# Patient Record
Sex: Female | Born: 1985 | Race: Black or African American | Hispanic: No | Marital: Married | Smoking: Never smoker
Health system: Southern US, Community
[De-identification: ages and names within clinical notes are randomized; demographics above are authoritative.]

## PROBLEM LIST (undated history)

## (undated) ENCOUNTER — Emergency Department: Admission: EM | Payer: Medicaid Other | Source: Home / Self Care

## (undated) ENCOUNTER — Inpatient Hospital Stay: Payer: Self-pay

## (undated) DIAGNOSIS — I10 Essential (primary) hypertension: Secondary | ICD-10-CM

## (undated) DIAGNOSIS — K439 Ventral hernia without obstruction or gangrene: Secondary | ICD-10-CM

## (undated) DIAGNOSIS — K219 Gastro-esophageal reflux disease without esophagitis: Secondary | ICD-10-CM

## (undated) DIAGNOSIS — R519 Headache, unspecified: Secondary | ICD-10-CM

## (undated) DIAGNOSIS — N883 Incompetence of cervix uteri: Secondary | ICD-10-CM

## (undated) DIAGNOSIS — O99212 Obesity complicating pregnancy, second trimester: Secondary | ICD-10-CM

## (undated) DIAGNOSIS — N926 Irregular menstruation, unspecified: Secondary | ICD-10-CM

## (undated) DIAGNOSIS — O98119 Syphilis complicating pregnancy, unspecified trimester: Secondary | ICD-10-CM

## (undated) DIAGNOSIS — Z8719 Personal history of other diseases of the digestive system: Secondary | ICD-10-CM

## (undated) HISTORY — PX: CHOLECYSTECTOMY: SHX55

## (undated) HISTORY — DX: Obesity complicating pregnancy, second trimester: O99.212

## (undated) HISTORY — DX: Ventral hernia without obstruction or gangrene: K43.9

## (undated) HISTORY — DX: Morbid (severe) obesity due to excess calories: E66.01

---

## 2003-06-24 DIAGNOSIS — N883 Incompetence of cervix uteri: Secondary | ICD-10-CM

## 2004-04-10 ENCOUNTER — Observation Stay: Payer: Self-pay

## 2004-04-10 ENCOUNTER — Inpatient Hospital Stay: Payer: Self-pay | Admitting: Obstetrics & Gynecology

## 2004-07-21 ENCOUNTER — Emergency Department: Payer: Self-pay | Admitting: Emergency Medicine

## 2004-11-15 ENCOUNTER — Emergency Department: Payer: Self-pay | Admitting: Emergency Medicine

## 2004-11-15 ENCOUNTER — Other Ambulatory Visit: Payer: Self-pay

## 2004-11-19 ENCOUNTER — Ambulatory Visit (HOSPITAL_COMMUNITY): Admission: RE | Admit: 2004-11-19 | Discharge: 2004-11-19 | Payer: Self-pay | Admitting: Gynecology

## 2004-12-08 ENCOUNTER — Emergency Department: Payer: Self-pay | Admitting: Unknown Physician Specialty

## 2005-01-01 ENCOUNTER — Ambulatory Visit (HOSPITAL_COMMUNITY): Admission: RE | Admit: 2005-01-01 | Discharge: 2005-01-01 | Payer: Self-pay | Admitting: Gynecology

## 2005-02-17 ENCOUNTER — Observation Stay: Payer: Self-pay | Admitting: Obstetrics and Gynecology

## 2005-04-08 ENCOUNTER — Emergency Department: Payer: Self-pay | Admitting: Emergency Medicine

## 2005-04-26 ENCOUNTER — Observation Stay: Payer: Self-pay

## 2005-05-09 ENCOUNTER — Observation Stay: Payer: Self-pay | Admitting: Obstetrics & Gynecology

## 2005-05-15 ENCOUNTER — Inpatient Hospital Stay: Payer: Self-pay | Admitting: Obstetrics & Gynecology

## 2005-05-16 DIAGNOSIS — O3430 Maternal care for cervical incompetence, unspecified trimester: Secondary | ICD-10-CM

## 2006-09-08 ENCOUNTER — Emergency Department: Payer: Self-pay

## 2007-04-06 ENCOUNTER — Emergency Department: Payer: Self-pay | Admitting: Internal Medicine

## 2007-04-18 ENCOUNTER — Emergency Department: Payer: Self-pay

## 2007-05-29 ENCOUNTER — Emergency Department: Payer: Self-pay | Admitting: Internal Medicine

## 2007-06-03 ENCOUNTER — Emergency Department: Payer: Self-pay | Admitting: Emergency Medicine

## 2009-02-21 ENCOUNTER — Ambulatory Visit: Payer: Self-pay

## 2009-04-26 ENCOUNTER — Encounter: Payer: Self-pay | Admitting: Maternal & Fetal Medicine

## 2009-04-30 ENCOUNTER — Encounter: Payer: Self-pay | Admitting: Obstetrics and Gynecology

## 2009-05-21 ENCOUNTER — Encounter: Payer: Self-pay | Admitting: Maternal & Fetal Medicine

## 2009-06-18 ENCOUNTER — Encounter: Payer: Self-pay | Admitting: Obstetrics and Gynecology

## 2009-06-21 ENCOUNTER — Encounter: Payer: Self-pay | Admitting: Maternal & Fetal Medicine

## 2009-06-28 ENCOUNTER — Encounter: Payer: Self-pay | Admitting: Obstetrics and Gynecology

## 2009-08-06 ENCOUNTER — Encounter: Payer: Self-pay | Admitting: Maternal & Fetal Medicine

## 2009-08-30 ENCOUNTER — Encounter: Payer: Self-pay | Admitting: Obstetrics & Gynecology

## 2009-09-11 ENCOUNTER — Encounter: Payer: Self-pay | Admitting: Pediatric Cardiology

## 2009-11-02 ENCOUNTER — Observation Stay: Payer: Self-pay

## 2009-11-06 ENCOUNTER — Observation Stay: Payer: Self-pay | Admitting: Obstetrics and Gynecology

## 2009-12-04 ENCOUNTER — Observation Stay: Payer: Self-pay | Admitting: Obstetrics and Gynecology

## 2009-12-07 ENCOUNTER — Observation Stay: Payer: Self-pay | Admitting: Obstetrics and Gynecology

## 2009-12-11 ENCOUNTER — Observation Stay: Payer: Self-pay | Admitting: Obstetrics and Gynecology

## 2009-12-14 ENCOUNTER — Inpatient Hospital Stay: Payer: Self-pay

## 2009-12-14 DIAGNOSIS — O3430 Maternal care for cervical incompetence, unspecified trimester: Secondary | ICD-10-CM

## 2010-03-14 ENCOUNTER — Emergency Department: Payer: Self-pay | Admitting: Emergency Medicine

## 2010-04-05 ENCOUNTER — Emergency Department: Payer: Self-pay | Admitting: Emergency Medicine

## 2010-04-09 LAB — HM PAP SMEAR: HM Pap smear: NEGATIVE

## 2010-04-14 ENCOUNTER — Observation Stay: Payer: Self-pay | Admitting: Vascular Surgery

## 2010-07-14 ENCOUNTER — Encounter: Payer: Self-pay | Admitting: Gynecology

## 2011-12-15 ENCOUNTER — Ambulatory Visit: Payer: Self-pay | Admitting: Family Medicine

## 2015-05-12 ENCOUNTER — Ambulatory Visit
Admission: EM | Admit: 2015-05-12 | Discharge: 2015-05-12 | Discharge status: Against Medical Advice | Payer: Medicaid Other | Attending: Internal Medicine | Admitting: Internal Medicine

## 2015-05-12 ENCOUNTER — Encounter: Payer: Self-pay | Admitting: Emergency Medicine

## 2015-05-12 HISTORY — DX: Irregular menstruation, unspecified: N92.6

## 2015-05-12 HISTORY — DX: Essential (primary) hypertension: I10

## 2015-05-12 NOTE — ED Notes (Signed)
Pt having heavy bleeding, stands for 12 hours and bleeding , has to frequently go to bathroom to change pads, periods irregular.

## 2015-05-21 NOTE — ED Provider Notes (Signed)
Patient left without being seen  Eustace MooreLaura W Xylan Sheils, MD 05/21/15 [phone]

## 2017-01-28 ENCOUNTER — Emergency Department
Admission: EM | Admit: 2017-01-28 | Discharge: 2017-01-28 | Disposition: A | Discharge status: Home / Self Care | Payer: Self-pay | Attending: Emergency Medicine | Admitting: Emergency Medicine

## 2017-01-28 ENCOUNTER — Emergency Department: Payer: Self-pay

## 2017-01-28 DIAGNOSIS — M62830 Muscle spasm of back: Secondary | ICD-10-CM | POA: Insufficient documentation

## 2017-01-28 DIAGNOSIS — M7552 Bursitis of left shoulder: Secondary | ICD-10-CM | POA: Insufficient documentation

## 2017-01-28 DIAGNOSIS — I10 Essential (primary) hypertension: Secondary | ICD-10-CM | POA: Insufficient documentation

## 2017-01-28 MED ORDER — TRAMADOL HCL 50 MG PO TABS: 50.0000 mg | ORAL_TABLET | Freq: Four times a day (QID) | ORAL | 0 refills | Status: DC | PRN

## 2017-01-28 MED ORDER — NAPROXEN 500 MG PO TABS
500.0000 mg | ORAL_TABLET | Freq: Once | ORAL | Status: AC
  Administered 2017-01-28: 500 mg via ORAL
  Filled 2017-01-28: qty 1

## 2017-01-28 MED ORDER — CYCLOBENZAPRINE HCL 10 MG PO TABS: 10.0000 mg | ORAL_TABLET | Freq: Three times a day (TID) | ORAL | 0 refills | Status: DC | PRN

## 2017-01-28 MED ORDER — NAPROXEN 500 MG PO TABS: 500.0000 mg | ORAL_TABLET | Freq: Two times a day (BID) | ORAL | Status: DC

## 2017-01-28 MED ORDER — CYCLOBENZAPRINE HCL 10 MG PO TABS
10.0000 mg | ORAL_TABLET | Freq: Once | ORAL | Status: AC
  Administered 2017-01-28: 10 mg via ORAL
  Filled 2017-01-28: qty 1

## 2017-01-28 MED ORDER — TRAMADOL HCL 50 MG PO TABS
50.0000 mg | ORAL_TABLET | Freq: Once | ORAL | Status: AC
  Administered 2017-01-28: 50 mg via ORAL
  Filled 2017-01-28: qty 1

## 2017-01-28 NOTE — ED Provider Notes (Signed)
[facility] Emergency Department Provider Note   ____________________________________________   First MD Initiated Contact with Patient 01/28/17 [phone]     (approximate)  I have reviewed the triage vital signs and the nursing notes.   HISTORY  Chief Complaint Shoulder Pain    HPI Donna Navarro is a 31 y.o. female patient complaining of increasing left shoulder pain and swelling for 3 days. Patient denies any provocative incident. 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 He is right-hand dominant.Patient rates the pain as a 7/10. Patient described a pain as "achy". Patient also complaining of mid back muscle spasms. No palliative measures for both complaints.   Past Medical History:  Diagnosis Date  . Hypertension   . Irregular periods     There are no active problems to display for this patient.   Past Surgical History:  Procedure Laterality Date  . CHOLECYSTECTOMY      Prior to Admission medications   Medication Sig Start Date End Date Taking? Authorizing Provider  cyclobenzaprine (FLEXERIL) 10 MG tablet Take 1 tablet (10 mg total) by mouth 3 (three) times daily as needed. 01/28/17   Joni ReiningSmith, Ava Deguire K, PA-C  naproxen (NAPROSYN) 500 MG tablet Take 1 tablet (500 mg total) by mouth 2 (two) times daily with a meal. 01/28/17   Joni ReiningSmith, Renel Ende K, PA-C  traMADol (ULTRAM) 50 MG tablet Take 1 tablet (50 mg total) by mouth every 6 (six) hours as needed for moderate pain. 01/28/17   Joni ReiningSmith, Karem Tomaso K, PA-C    Allergies Patient has no known allergies.  No family history on file.  Social History Social History  Substance Use Topics  . Smoking status: Never Smoker  . Smokeless tobacco: Never Used  . Alcohol use No    Review of Systems  Constitutional: No fever/chills Eyes: No visual changes. ENT: No sore throat. Cardiovascular: Denies chest pain. Respiratory: Denies shortness of breath. Gastrointestinal: No abdominal pain.  No nausea, no  vomiting.  No diarrhea.  No constipation. Genitourinary: Negative for dysuria. Musculoskeletal: Shoulder mid back pain  Skin: Negative for rash. Neurological: Negative for headaches, focal weakness or numbness. Endocrine:Hypertension ____________________________________________   PHYSICAL EXAM:  VITAL SIGNS: ED Triage Vitals  Enc Vitals Group     BP 01/28/17 0827 (!) 158/97     Pulse Rate 01/28/17 0827 88     Resp 01/28/17 0827 18     Temp 01/28/17 0827 98.4 F (36.9 C)     Temp Source 01/28/17 0827 Oral     SpO2 01/28/17 0827 100 %     Weight 01/28/17 0828 300 lb (136.1 kg)     Height 01/28/17 0828 5\' 7"  (1.702 m)     Head Circumference --      Peak Flow --      Pain Score 01/28/17 0818 7     Pain Loc --      Pain Edu? --      Excl. in GC? --     Constitutional: Alert and oriented. Well appearing and in no acute distress.Morbid obesity Cardiovascular: Normal rate, regular rhythm. Grossly normal heart sounds.  Good peripheral circulation. Limited blood pressure Respiratory: Normal respiratory effort.  No retractions. Lungs CTAB. Musculoskeletal: No obvious deformity to the left shoulder. [facility]. Examination of the back is limited by patient body habitus. No obvious deformity. No guarding palpation spinal processes. Patient has bilateral infrascapular muscle spasms with abduction and overhead reaching the arms.  Neurologic:  Normal speech  and language. No gross focal neurologic deficits are appreciated. No gait instability. Skin:  Skin is warm, dry and intact. No rash noted. Psychiatric: Mood and affect are normal. Speech and behavior are normal.  ____________________________________________   LABS (all labs ordered are listed, but only abnormal results are displayed)  Labs Reviewed - No data to display ____________________________________________  EKG   ____________________________________________  RADIOLOGY  Dg Shoulder Left  Result  Date: 01/28/2017 CLINICAL DATA:  Shoulder pain for several days, no known injury, initial encounter EXAM: LEFT SHOULDER - 2+ VIEW COMPARISON:  None. FINDINGS: There is no evidence of fracture or dislocation. There is no evidence of arthropathy or other focal bone abnormality. Soft tissues are unremarkable. IMPRESSION: No acute abnormality noted. Electronically Signed   By: Alcide Clever M.D.   On: 01/28/2017 09:18    No acute findings of the left shoulder __________________________________________   PROCEDURES  Procedure(s) performed: None  Procedures  Critical Care performed: No  ____________________________________________   INITIAL IMPRESSION / ASSESSMENT AND PLAN / ED COURSE  Pertinent labs & imaging results that were available during my care of the patient were reviewed by me and considered in my medical decision making (see chart for details).  Bursitis of the left shoulder muscle spasms. Discuss negative x-ray finding with patient. Patient given discharge care instructions. Patient advised to take medication as directed and follow with PCP.    ____________________________________________   FINAL CLINICAL IMPRESSION(S) / ED DIAGNOSES  Final diagnoses:  Bursitis of left shoulder  Back muscle spasm      NEW MEDICATIONS STARTED DURING THIS VISIT:  New Prescriptions   CYCLOBENZAPRINE (FLEXERIL) 10 MG TABLET    Take 1 tablet (10 mg total) by mouth 3 (three) times daily as needed.   NAPROXEN (NAPROSYN) 500 MG TABLET    Take 1 tablet (500 mg total) by mouth 2 (two) times daily with a meal.   TRAMADOL (ULTRAM) 50 MG TABLET    Take 1 tablet (50 mg total) by mouth every 6 (six) hours as needed for moderate pain.     Note:  This document was prepared using Dragon voice recognition software and may include unintentional dictation errors.    Joni Reining, PA-C 01/28/17 0932    Emily Filbert, MD 01/28/17 [phone]

## 2017-01-28 NOTE — ED Triage Notes (Signed)
Pt c/o left shoulder pain for the past 3 days, denies any injuries.

## 2017-03-14 ENCOUNTER — Encounter: Payer: Self-pay | Admitting: Medical Oncology

## 2017-03-14 ENCOUNTER — Emergency Department
Admission: EM | Admit: 2017-03-14 | Discharge: 2017-03-14 | Disposition: A | Discharge status: Home / Self Care | Payer: Self-pay | Attending: Emergency Medicine | Admitting: Emergency Medicine

## 2017-03-14 DIAGNOSIS — N938 Other specified abnormal uterine and vaginal bleeding: Secondary | ICD-10-CM | POA: Insufficient documentation

## 2017-03-14 DIAGNOSIS — N390 Urinary tract infection, site not specified: Secondary | ICD-10-CM | POA: Insufficient documentation

## 2017-03-14 DIAGNOSIS — I10 Essential (primary) hypertension: Secondary | ICD-10-CM | POA: Insufficient documentation

## 2017-03-14 LAB — URINALYSIS, COMPLETE (UACMP) WITH MICROSCOPIC
Bacteria, UA: NONE SEEN
Bilirubin Urine: NEGATIVE
GLUCOSE, UA: NEGATIVE mg/dL
Ketones, ur: NEGATIVE mg/dL
Nitrite: NEGATIVE
PH: 6 (ref 5.0–8.0)
Protein, ur: NEGATIVE mg/dL
SPECIFIC GRAVITY, URINE: 1.018 (ref 1.005–1.030)

## 2017-03-14 LAB — POCT PREGNANCY, URINE: Preg Test, Ur: NEGATIVE

## 2017-03-14 MED ORDER — PHENAZOPYRIDINE HCL 100 MG PO TABS: 100.0000 mg | ORAL_TABLET | Freq: Three times a day (TID) | ORAL | 0 refills | Status: DC | PRN

## 2017-03-14 MED ORDER — SULFAMETHOXAZOLE-TRIMETHOPRIM 800-160 MG PO TABS: 1.0000 | ORAL_TABLET | Freq: Two times a day (BID) | ORAL | 0 refills | Status: DC

## 2017-03-14 NOTE — ED Triage Notes (Signed)
Pt reports she began her period on 9/16 and passed some clots, pt denies having any vaginal bleeding at this time, states that she has been having lower abd cramping but denies this at this time also. Pt in NAD.

## 2017-03-14 NOTE — ED Provider Notes (Signed)
[facility] Emergency Department Provider Note  ___________________________________________   First MD Initiated Contact with Patient 03/14/17 [phone]     (approximate)  I have reviewed the triage vital signs and the nursing notes.   HISTORY  Chief Complaint Vaginal Bleeding and Abdominal Cramping   HPI Donna Navarro is a 31 y.o. female is here with complaint of lower abdominal pain. Patient states she's also had some dysuria and frequency. She states that she originally thought that this was menstrual cramps that she was experiencing. She denies any fever or chills. She denies any back pain, nausea or vomiting. She denies any pain at this time.  Past Medical History:  Diagnosis Date  . Hypertension   . Irregular periods     There are no active problems to display for this patient.   Past Surgical History:  Procedure Laterality Date  . CHOLECYSTECTOMY      Prior to Admission medications   Medication Sig Start Date End Date Taking? Authorizing Provider  phenazopyridine (PYRIDIUM) 100 MG tablet Take 1 tablet (100 mg total) by mouth 3 (three) times daily as needed for pain. 03/14/17 03/14/18  Tommi Rumps, PA-C  sulfamethoxazole-trimethoprim (BACTRIM DS,SEPTRA DS) 800-160 MG tablet Take 1 tablet by mouth 2 (two) times daily. 03/14/17   Tommi Rumps, PA-C    Allergies Patient has no known allergies.  No family history on file.  Social History Social History  Substance Use Topics  . Smoking status: Never Smoker  . Smokeless tobacco: Never Used  . Alcohol use No    Review of Systems Constitutional: No fever/chills Cardiovascular: Denies chest pain. Respiratory: Denies shortness of breath. Gastrointestinal: Resolved abdominal pain.  No nausea, no vomiting. Genitourinary: Positive for dysuria. Positive for frequency. Musculoskeletal: Negative for back pain. Skin: Negative for rash. Neurological: Negative for headaches, focal  weakness or numbness. ___________________________________________   PHYSICAL EXAM:  VITAL SIGNS: ED Triage Vitals  Enc Vitals Group     BP 03/14/17 0724 (!) 174/98     Pulse Rate 03/14/17 0724 89     Resp 03/14/17 0724 16     Temp 03/14/17 0724 98.1 F (36.7 C)     Temp Source 03/14/17 0724 Oral     SpO2 03/14/17 0724 100 %     Weight 03/14/17 0725 (!) 330 lb (149.7 kg)     Height 03/14/17 0725  (1.702 m)     Head Circumference --      Peak Flow --      Pain Score 03/14/17 0724 0     Pain Loc --      Pain Edu? --      Excl. in GC? --    Constitutional: Alert and oriented. Well appearing and in no acute distress. Eyes: Conjunctivae are normal.  Head: Atraumatic. Nose: No congestion/rhinnorhea. Neck: No stridor.   Cardiovascular: Normal rate, regular rhythm. Grossly normal heart sounds.  Good peripheral circulation. Respiratory: Normal respiratory effort.  No retractions. Lungs CTAB. Gastrointestinal: Soft and nontender. No distention.  Musculoskeletal: Moves upper and lower extremities without difficulty. Normal gait was noted. Neurologic:  Normal speech and language. No gross focal neurologic deficits are appreciated.  Skin:  Skin is warm, dry and intact.  Psychiatric: Mood and affect are normal. Speech and behavior are normal.  ____________________________________________   LABS (all labs ordered are listed, but only abnormal results are displayed)  Labs Reviewed  URINALYSIS, COMPLETE (UACMP) WITH MICROSCOPIC - Abnormal; Notable for the following:  Result Value   Color, Urine YELLOW (*)    APPearance HAZY (*)    Hgb urine dipstick SMALL (*)    Leukocytes, UA MODERATE (*)    Squamous Epithelial / LPF 6-30 (*)    All other components within normal limits  POC URINE PREG, ED  POCT PREGNANCY, URINE    PROCEDURES  Procedure(s) performed: None  Procedures  Critical Care performed: No  ____________________________________________   INITIAL  IMPRESSION / ASSESSMENT AND PLAN / ED COURSE  Pertinent labs & imaging results that were available during my care of the patient were reviewed by me and considered in my medical decision making (see chart for details).  Patient was made aware that she has a urinary tract infection. She states that this explains her urinary frequency that she did not mention prior to her history. Patient was placed on Bactrim twice a day for 10 days. She is also given a prescription for Pyridium 1 tablet 3 times a day for dysuria and frequency. She is encouraged to increase fluids and take Tylenol if needed. She'll [facility]-up [facility] in 14 days for recheck of her urine and also her blood pressure.   ___________________________________________   FINAL CLINICAL IMPRESSION(S) / ED DIAGNOSES  Final diagnoses:  Acute urinary tract infection      NEW MEDICATIONS STARTED DURING THIS VISIT:  Discharge Medication List as of 03/14/2017  9:31 AM    START taking these medications   Details  phenazopyridine (PYRIDIUM) 100 MG tablet Take 1 tablet (100 mg total) by mouth 3 (three) times daily as needed for pain., Starting Sat 03/14/2017, Until Sun 03/14/2018, Print    sulfamethoxazole-trimethoprim (BACTRIM DS,SEPTRA DS) 800-160 MG tablet Take 1 tablet by mouth 2 (two) times daily., Starting Sat 03/14/2017, Print         Note:  This document was prepared using Dragon voice recognition software and may include unintentional dictation errors.    Tommi Rumps, PA-C 03/14/17 1220    Nita Sickle, MD 03/18/17 [phone]

## 2017-03-14 NOTE — ED Notes (Signed)
NAD noted at time of D/C. Pt denies questions or concerns. Pt ambulatory to the lobby at this time. Pt instructed to take her BP medication when she gets home. PA made aware of pt's BP.

## 2017-03-14 NOTE — Discharge Instructions (Signed)
Begin taking antibiotics today. Pyridium one tablet 3 times a day. Be aware that this medication will turn your urine a fluorescent orange color. Increase fluids. He may also take Tylenol if needed for pain. Follow-up with your primary care doctor for recheck of your urine in approximately 14 days.

## 2017-03-14 NOTE — ED Notes (Signed)
Pt states started period on 9/16, states hx of irregular periods. Pt states started passing several clots. Pt denies any bleeding at this time. Pt also c/o menstrual cramps during her period, states cramping has not resolved, pt states cramping is intermittent. Pt denies any vomiting or diarrhea. Pt states she takes Tylenol and Alleve OTC for pain with relief at home.

## 2017-04-28 ENCOUNTER — Emergency Department: Payer: Self-pay

## 2017-04-28 ENCOUNTER — Emergency Department
Admission: EM | Admit: 2017-04-28 | Discharge: 2017-04-28 | Disposition: A | Discharge status: Home / Self Care | Payer: Self-pay | Attending: Emergency Medicine | Admitting: Emergency Medicine

## 2017-04-28 ENCOUNTER — Encounter: Payer: Self-pay | Admitting: Emergency Medicine

## 2017-04-28 DIAGNOSIS — K432 Incisional hernia without obstruction or gangrene: Secondary | ICD-10-CM | POA: Insufficient documentation

## 2017-04-28 DIAGNOSIS — I10 Essential (primary) hypertension: Secondary | ICD-10-CM | POA: Insufficient documentation

## 2017-04-28 DIAGNOSIS — M7552 Bursitis of left shoulder: Secondary | ICD-10-CM | POA: Insufficient documentation

## 2017-04-28 LAB — CBC WITH DIFFERENTIAL/PLATELET
BASOS ABS: 0.1 10*3/uL (ref 0–0.1)
Basophils Relative: 0 %
EOS ABS: 0.1 10*3/uL (ref 0–0.7)
EOS PCT: 1 %
HCT: 37.6 % (ref 35.0–47.0)
Hemoglobin: 12.1 g/dL (ref 12.0–16.0)
Lymphocytes Relative: 20 %
Lymphs Abs: 2.8 10*3/uL (ref 1.0–3.6)
MCH: 28 pg (ref 26.0–34.0)
MCHC: 32.1 g/dL (ref 32.0–36.0)
MCV: 87 fL (ref 80.0–100.0)
Monocytes Absolute: 0.8 10*3/uL (ref 0.2–0.9)
Monocytes Relative: 5 %
NEUTROS PCT: 74 %
Neutro Abs: 10.5 10*3/uL — ABNORMAL HIGH (ref 1.4–6.5)
PLATELETS: 351 10*3/uL (ref 150–440)
RBC: 4.33 MIL/uL (ref 3.80–5.20)
RDW: 14.5 % (ref 11.5–14.5)
WBC: 14.2 10*3/uL — AB (ref 3.6–11.0)

## 2017-04-28 LAB — COMPREHENSIVE METABOLIC PANEL
ALT: 17 U/L (ref 14–54)
AST: 20 U/L (ref 15–41)
Albumin: 3.7 g/dL (ref 3.5–5.0)
Alkaline Phosphatase: 84 U/L (ref 38–126)
Anion gap: 8 (ref 5–15)
BILIRUBIN TOTAL: 0.6 mg/dL (ref 0.3–1.2)
BUN: 12 mg/dL (ref 6–20)
CO2: 26 mmol/L (ref 22–32)
CREATININE: 0.83 mg/dL (ref 0.44–1.00)
Calcium: 8.8 mg/dL — ABNORMAL LOW (ref 8.9–10.3)
Chloride: 102 mmol/L (ref 101–111)
Glucose, Bld: 133 mg/dL — ABNORMAL HIGH (ref 65–99)
POTASSIUM: 3.5 mmol/L (ref 3.5–5.1)
Sodium: 136 mmol/L (ref 135–145)
TOTAL PROTEIN: 7.5 g/dL (ref 6.5–8.1)

## 2017-04-28 LAB — URINALYSIS, COMPLETE (UACMP) WITH MICROSCOPIC
BILIRUBIN URINE: NEGATIVE
Bacteria, UA: NONE SEEN
GLUCOSE, UA: NEGATIVE mg/dL
KETONES UR: NEGATIVE mg/dL
NITRITE: NEGATIVE
PH: 6 (ref 5.0–8.0)
PROTEIN: NEGATIVE mg/dL
Specific Gravity, Urine: 1.019 (ref 1.005–1.030)

## 2017-04-28 LAB — POCT PREGNANCY, URINE: PREG TEST UR: NEGATIVE

## 2017-04-28 MED ORDER — IOPAMIDOL (ISOVUE-300) INJECTION 61%
100.0000 mL | Freq: Once | INTRAVENOUS | Status: AC | PRN
  Administered 2017-04-28: 100 mL via INTRAVENOUS
  Filled 2017-04-28: qty 100

## 2017-04-28 MED ORDER — NAPROXEN 500 MG PO TABS: 500.0000 mg | ORAL_TABLET | Freq: Two times a day (BID) | ORAL | 0 refills | Status: DC

## 2017-04-28 MED ORDER — KETOROLAC TROMETHAMINE 30 MG/ML IJ SOLN
30.0000 mg | Freq: Once | INTRAMUSCULAR | Status: AC
  Administered 2017-04-28: 30 mg via INTRAVENOUS

## 2017-04-28 MED ORDER — KETOROLAC TROMETHAMINE 30 MG/ML IJ SOLN
INTRAMUSCULAR | Status: AC
  Administered 2017-04-28: 30 mg via INTRAVENOUS
  Filled 2017-04-28: qty 1

## 2017-04-28 MED ORDER — IOPAMIDOL (ISOVUE-300) INJECTION 61%
30.0000 mL | Freq: Once | INTRAVENOUS | Status: AC | PRN
  Administered 2017-04-28: 30 mL via ORAL
  Filled 2017-04-28: qty 30

## 2017-04-28 NOTE — ED Notes (Signed)
First Nurse Note:  Patient complaining of left shoulder pain.  Felt numb yesterday and pain started today.

## 2017-04-28 NOTE — ED Notes (Signed)
Pt states left shoulder pain and swelling, states she believes that it is "fluid" on her shoulder, pt also states large hernia in her abd, states pain is worsening in hernia over the past few days and the herina is growing, awake and alert in no acute distress

## 2017-04-28 NOTE — ED Notes (Signed)
Patient transported to CT 

## 2017-04-28 NOTE — ED Triage Notes (Signed)
Pt reports has had issues with fluid on her left shoulder and it hurts intermittently and now she thinks that the fluid is building back up. Pt reports hurting for a couple of days worse this morning. Pt reports it gets worse in rainy weather. Pt reports last time she had to get some medication to help with the fluid. Pt reports also has a hernia that is sore. Reports has been sore for 4 years. Pt reports was supposed to be set up with a surgeon but her PCP has not done that yet.

## 2017-04-28 NOTE — ED Provider Notes (Signed)
[facility] Emergency Department Provider Note   ____________________________________________   First MD Initiated Contact with Patient 04/28/17 1001     (approximate)  I have reviewed the triage vital signs and the nursing notes.   HISTORY  Chief Complaint Shoulder Injury and Hernia    HPI Unknown FoleyJessica N Keeble is a 31 y.o. female patient presents with a large abdominal hernia with increased in size and pain in 2 days. Patient stated decreased appetite secondary to abdominal pain. Patient denies nausea, vomiting, or diarrhea.  Patient also complaining of left shoulder pain which she believes is secondary to a flare up of her bursitis. Patient rates her shoulder and abdominal pain as a 5 over 5. No palliative measures for complaint.  Past Medical History:  Diagnosis Date  . Hypertension   . Irregular periods     There are no active problems to display for this patient.   Past Surgical History:  Procedure Laterality Date  . CHOLECYSTECTOMY      Prior to Admission medications   Medication Sig Start Date End Date Taking? Authorizing Provider  naproxen (NAPROSYN) 500 MG tablet Take 1 tablet (500 mg total) 2 (two) times daily with a meal by mouth. 04/28/17   Joni ReiningSmith, Ronald K, PA-C  phenazopyridine (PYRIDIUM) 100 MG tablet Take 1 tablet (100 mg total) by mouth 3 (three) times daily as needed for pain. 03/14/17 03/14/18  Tommi RumpsSummers, Rhonda L, PA-C  sulfamethoxazole-trimethoprim (BACTRIM DS,SEPTRA DS) 800-160 MG tablet Take 1 tablet by mouth 2 (two) times daily. 03/14/17   Tommi RumpsSummers, Rhonda L, PA-C    Allergies Patient has no known allergies.  No family history on file.  Social History Social History   Tobacco Use  . Smoking status: Never Smoker  . Smokeless tobacco: Never Used  Substance Use Topics  . Alcohol use: No  . Drug use: Not on file    Review of Systems Constitutional: No fever/chills Eyes: No visual changes. ENT: No sore  throat. Cardiovascular: Denies chest pain. Respiratory: Denies shortness of breath. Gastrointestinal: Abdominal pain.  No nausea, no vomiting.  No diarrhea.  No constipation. Abdominal hernia. Genitourinary: Negative for dysuria. Musculoskeletal: Left shoulder pain Skin: Negative for rash. Neurological: Negative for headaches, focal weakness or numbness. Endocrine:Hypertension ____________________________________________   PHYSICAL EXAM:  VITAL SIGNS: ED Triage Vitals [04/28/17 0831]  Enc Vitals Group     BP (!) 153/104     Pulse Rate 92     Resp 20     Temp 97.7 F (36.5 C)     Temp Source Oral     SpO2 100 %     Weight (!) 323 lb (146.5 kg)     Height 5\' 7"  (1.702 m)     Head Circumference      Peak Flow      Pain Score      Pain Loc      Pain Edu?      Excl. in GC?     Constitutional: Alert and oriented. Well appearing and in no acute distress. Morbid obesity Eyes: Conjunctivae are normal. PERRL. EOMI. Head: Atraumatic. Nose: No congestion/rhinnorhea. Mouth/Throat: Mucous membranes are moist.  Oropharynx non-erythematous. Neck: No stridor.  No cervical spine tenderness to palpation. Hematological/Lymphatic/Immunilogical: No cervical lymphadenopathy. Cardiovascular: Normal rate, regular rhythm. Grossly normal heart sounds.  Good peripheral circulation. Elevated blood pressure Respiratory: Normal respiratory effort.  No retractions. Lungs CTAB. Gastrointestinal: Large umbilical hernia. Soft and nontender. No distention. No abdominal bruits. No CVA tenderness. Musculoskeletal: No obvious  deformity to left shoulder. Patient is moderate guarding palpation GH joint. Patient decreased range of motion with abduction overhead reaching.  Neurologic:  Normal speech and language. No gross focal neurologic deficits are appreciated. No gait instability. Skin:  Skin is warm, dry and intact. No rash noted. Psychiatric: Mood and affect are normal. Speech and behavior are  normal.  ____________________________________________   LABS (all labs ordered are listed, but only abnormal results are displayed)  Labs Reviewed  COMPREHENSIVE METABOLIC PANEL - Abnormal; Notable for the following components:      Result Value   Glucose, Bld 133 (*)    Calcium 8.8 (*)    All other components within normal limits  CBC WITH DIFFERENTIAL/PLATELET - Abnormal; Notable for the following components:   WBC 14.2 (*)    Neutro Abs 10.5 (*)    All other components within normal limits  URINALYSIS, COMPLETE (UACMP) WITH MICROSCOPIC - Abnormal; Notable for the following components:   Color, Urine YELLOW (*)    APPearance CLOUDY (*)    Hgb urine dipstick SMALL (*)    Leukocytes, UA MODERATE (*)    Squamous Epithelial / LPF 6-30 (*)    All other components within normal limits  POC URINE PREG, ED  POCT PREGNANCY, URINE   ____________________________________________  EKG   ____________________________________________  RADIOLOGY  Ct Abdomen Pelvis W Contrast  Result Date: 04/28/2017 CLINICAL DATA:  Abdominal hernia. EXAM: CT ABDOMEN AND PELVIS WITH CONTRAST TECHNIQUE: Multidetector CT imaging of the abdomen and pelvis was performed using the standard protocol following bolus administration of intravenous contrast. CONTRAST:  ISOVUE-300 IOPAMIDOL (ISOVUE-300) INJECTION 61% COMPARISON:  None available currently. FINDINGS: Lower chest: No acute abnormality. Hepatobiliary: No focal liver abnormality is seen. Status post cholecystectomy. No biliary dilatation. Pancreas: Unremarkable. No pancreatic ductal dilatation or surrounding inflammatory changes. Spleen: Normal in size without focal abnormality. Adrenals/Urinary Tract: Adrenal glands are unremarkable. Kidneys are normal, without renal calculi, focal lesion, or hydronephrosis. Bladder is unremarkable. Stomach/Bowel: The stomach appears normal. There is no evidence of bowel obstruction or inflammation. The appendix is not  visualized. Vascular/Lymphatic: No significant vascular findings are present. No enlarged abdominal or pelvic lymph nodes. Reproductive: Uterus and bilateral adnexa are unremarkable. Other: No abnormal fluid collection is noted. Large supraumbilical ventral hernia is noted which contains a loop of transverse colon, but does not result in incarceration or obstruction. Musculoskeletal: No acute or significant osseous findings. IMPRESSION: Large supraumbilical ventral hernia is noted which contains a loop of transverse colon, but does not result in incarceration or obstruction. No other abnormality seen in the abdomen or pelvis. Electronically Signed   By: Lupita Raider, M.D.   On: 04/28/2017 12:22    ____________________________________________   PROCEDURES  Procedure(s) performed: None  Procedures  Critical Care performed: No  ____________________________________________   INITIAL IMPRESSION / ASSESSMENT AND PLAN / ED COURSE  As part of my medical decision making, I reviewed the following data within the electronic MEDICAL RECORD NUMBER    Patient presents with left shoulder pain and protruding ventral Hernia. CT revealed that the hernia was not incarcerated or obstructed. I was able to manually reduce the hernia and instructed the patient on self reduction. Patient advised follow-up with the surgical department for definitive evaluation and treatment. Patient advised to take naproxen for bursitis of the left shoulder. Patient given a work note.      ____________________________________________   FINAL CLINICAL IMPRESSION(S) / ED DIAGNOSES  Final diagnoses:  Ventral hernia, recurrent  Bursitis of  left shoulder      Note:  This document was prepared using Dragon voice recognition software and may include unintentional dictation errors.    Joni ReiningSmith, Ronald K, PA-C 04/28/17 1316    Dionne BucySiadecki, Sebastian, MD 04/28/17 1527

## 2017-04-28 NOTE — Discharge Instructions (Signed)
Take medication as directed for shoulder pain. Continue to reduce hernia until evaluation by surgical clinic.

## 2017-04-30 ENCOUNTER — Ambulatory Visit (INDEPENDENT_AMBULATORY_CARE_PROVIDER_SITE_OTHER): Payer: Self-pay | Admitting: General Surgery

## 2017-04-30 ENCOUNTER — Encounter: Payer: Self-pay | Admitting: General Surgery

## 2017-04-30 DIAGNOSIS — K439 Ventral hernia without obstruction or gangrene: Secondary | ICD-10-CM | POA: Insufficient documentation

## 2017-04-30 HISTORY — DX: Morbid (severe) obesity due to excess calories: E66.01

## 2017-04-30 HISTORY — DX: Ventral hernia without obstruction or gangrene: K43.9

## 2017-04-30 NOTE — Patient Instructions (Signed)
We will call you as soon as we have an answer.

## 2017-04-30 NOTE — Progress Notes (Signed)
Patient ID: Donna Navarro, female   DOB: 1986/05/13, 31 y.o.   MRN: [id number]  CC: Ventral hernia  HPI Donna Navarro is a 31 y.o. female who presents to clinic today for follow-up of the ventral hernia.  She was seen in the ER for this 2 days ago.  She states she has intermittent periods of intense pain to her abdomen at the site of her hernia.  Is been there for at least 6 years.  There are times it goes away completely and other times it protrudes well beyond its normal limits.  She states today with currently a good day.  She denies any fevers, chills, chest pain, shortness of breath.  She does have abdominal pain, intermittent nausea, intermittent diarrhea.  She denies vomiting or constipation.  She is otherwise in her usual state of health but states that her blood pressure is elevated and her weight is going up.  HPI  Past Medical History:  Diagnosis Date  . Hypertension   . Irregular periods     Past Surgical History:  Procedure Laterality Date  . CHOLECYSTECTOMY      Family History  Problem Relation Age of Onset  . Heart disease Mother        Massive Heart Attack    Social History Social History   Tobacco Use  . Smoking status: Never Smoker  . Smokeless tobacco: Never Used  Substance Use Topics  . Alcohol use: No  . Drug use: Not on file    No Known Allergies  Current Outpatient Medications  Medication Sig Dispense Refill  . ibuprofen (ADVIL,MOTRIN) 200 MG tablet Take 200 mg every 6 (six) hours as needed by mouth.     No current facility-administered medications for this visit.      Review of Systems A multi-point review of systems was asked and was negative except for the findings documented in the HPI  Physical Exam Blood pressure (!) 156/95, pulse (!) 103, temperature 97.9 F (36.6 C), temperature source Oral, height 5\' 7"  (1.702 m), weight (!) 160.1 kg (353 lb), last menstrual period 04/11/2017. CONSTITUTIONAL: No acute distress. EYES: Pupils  are equal, round, and reactive to light, Sclera are non-icteric. EARS, NOSE, MOUTH AND THROAT: The oropharynx is clear. The oral mucosa is pink and moist. Hearing is intact to voice. LYMPH NODES:  Lymph nodes in the neck are normal. RESPIRATORY:  Lungs are clear. There is normal respiratory effort, with equal breath sounds bilaterally, and without pathologic use of accessory muscles. CARDIOVASCULAR: Heart is regular without murmurs, gallops, or rubs. GI: The abdomen is very large, soft, minimally tender to deep palpation around her obvious hernia, and nondistended. There is an obvious ventral hernia in the middle of her abdomen that is soft and easily reducible but spontaneously recurs after palpation is released. There is no hepatosplenomegaly. There are normal bowel sounds in all quadrants. GU: Rectal deferred.   MUSCULOSKELETAL: Normal muscle strength and tone. No cyanosis or edema.   SKIN: Turgor is good and there are no pathologic skin lesions or ulcers. NEUROLOGIC: Motor and sensation is grossly normal. Cranial nerves are grossly intact. PSYCH:  Oriented to person, place and time. Affect is normal.  Data Reviewed Images and labs reviewed from the ER 2 days ago which showed a minimally increased white blood cell count of 14.2 but normal H&H and platelet count.  Normal electrolytes with the exception of a glucose of 133 and a calcium of 8.8.  Urinalysis was normal.  CT scan  of the abdomen showed a large ventral hernia measuring approximately 5 cm in greatest diameter.  It is bowel and omentum containing.  There were no signs of obstruction. I have personally reviewed the patient's imaging, laboratory findings and medical records.    Assessment    Ventral hernia in the setting of morbid obesity    Plan    31 year old morbidly obese female with a large ventral hernia.  Discussed the signs and symptoms of incarceration and string elation in detail and to report to the nearest emergency room  should they occur.  Otherwise discussed whether or not the patient be interested in bariatric surgery.  Discussed that given her current obesity that the risk of recurrence would be very high causing her to require multiple operations for this.  Patient voiced understanding.  She is interested in bariatric surgery but is unsure whether or not she can afford it.  Financial assistance paperwork provided today.  We will set her up to be evaluated by bariatric surgeon at a seminar.  She will follow-up in clinic on an as-needed basis or for hernia repair should she not be able to afford bariatric surgery.     Time spent with the patient was 30 minutes, with more than 50% of the time spent in face-to-face education, counseling and care coordination.     Ricarda Frameharles Emonnie Cannady, MD FACS General Surgeon 04/30/2017, 3:06 PM

## 2017-05-01 ENCOUNTER — Telehealth: Payer: Self-pay

## 2017-05-01 NOTE — Telephone Encounter (Signed)
I tried to look for a Bariatric Surgeon that had financial assistance and I did not find one. I told her that Dr. Tonita CongWoodham wanted her to try to loose weight and see her back in 3 months and that then he would talk to her about scheduling her surgery date. Patient understood and had no further questions. I will send her appointment information by mail.

## 2017-05-01 NOTE — Telephone Encounter (Signed)
Called patient to let her know that I looked for Bariatric Surgeons and

## 2017-05-26 ENCOUNTER — Emergency Department
Admission: EM | Admit: 2017-05-26 | Discharge: 2017-05-26 | Disposition: A | Discharge status: Home / Self Care | Payer: Self-pay | Attending: Student in an Organized Health Care Education/Training Program | Admitting: Student in an Organized Health Care Education/Training Program

## 2017-05-26 ENCOUNTER — Encounter: Payer: Self-pay | Admitting: Emergency Medicine

## 2017-05-26 DIAGNOSIS — M542 Cervicalgia: Secondary | ICD-10-CM | POA: Insufficient documentation

## 2017-05-26 DIAGNOSIS — R519 Headache, unspecified: Secondary | ICD-10-CM

## 2017-05-26 DIAGNOSIS — R51 Headache: Secondary | ICD-10-CM | POA: Insufficient documentation

## 2017-05-26 DIAGNOSIS — Z79899 Other long term (current) drug therapy: Secondary | ICD-10-CM | POA: Insufficient documentation

## 2017-05-26 DIAGNOSIS — I10 Essential (primary) hypertension: Secondary | ICD-10-CM | POA: Insufficient documentation

## 2017-05-26 LAB — URINALYSIS, COMPLETE (UACMP) WITH MICROSCOPIC
BILIRUBIN URINE: NEGATIVE
Glucose, UA: NEGATIVE mg/dL
HGB URINE DIPSTICK: NEGATIVE
KETONES UR: NEGATIVE mg/dL
NITRITE: NEGATIVE
PH: 5 (ref 5.0–8.0)
Protein, ur: NEGATIVE mg/dL
SPECIFIC GRAVITY, URINE: 1.025 (ref 1.005–1.030)

## 2017-05-26 LAB — BASIC METABOLIC PANEL
Anion gap: 8 (ref 5–15)
BUN: 12 mg/dL (ref 6–20)
CHLORIDE: 104 mmol/L (ref 101–111)
CO2: 26 mmol/L (ref 22–32)
Calcium: 9.1 mg/dL (ref 8.9–10.3)
Creatinine, Ser: 0.82 mg/dL (ref 0.44–1.00)
Glucose, Bld: 98 mg/dL (ref 65–99)
POTASSIUM: 3.7 mmol/L (ref 3.5–5.1)
SODIUM: 138 mmol/L (ref 135–145)

## 2017-05-26 LAB — PREGNANCY, URINE: Preg Test, Ur: NEGATIVE

## 2017-05-26 LAB — CBC
HEMATOCRIT: 37.4 % (ref 35.0–47.0)
Hemoglobin: 12.4 g/dL (ref 12.0–16.0)
MCH: 28.2 pg (ref 26.0–34.0)
MCHC: 33.1 g/dL (ref 32.0–36.0)
MCV: 85.2 fL (ref 80.0–100.0)
PLATELETS: 293 10*3/uL (ref 150–440)
RBC: 4.39 MIL/uL (ref 3.80–5.20)
RDW: 14.8 % — ABNORMAL HIGH (ref 11.5–14.5)
WBC: 10 10*3/uL (ref 3.6–11.0)

## 2017-05-26 MED ORDER — BUTALBITAL-APAP-CAFFEINE 50-325-40 MG PO TABS: 1.0000 | ORAL_TABLET | Freq: Four times a day (QID) | ORAL | 0 refills | Status: AC | PRN

## 2017-05-26 MED ORDER — NAPROXEN 500 MG PO TABS: 500.0000 mg | ORAL_TABLET | Freq: Two times a day (BID) | ORAL | 0 refills | Status: DC

## 2017-05-26 MED ORDER — PROCHLORPERAZINE EDISYLATE 5 MG/ML IJ SOLN: 10.0000 mg | Freq: Once | INTRAMUSCULAR | Status: DC

## 2017-05-26 MED ORDER — DIPHENHYDRAMINE HCL 50 MG/ML IJ SOLN: 12.5000 mg | Freq: Once | INTRAMUSCULAR | Status: DC

## 2017-05-26 MED ORDER — PROCHLORPERAZINE MALEATE 10 MG PO TABS
10.0000 mg | ORAL_TABLET | Freq: Once | ORAL | Status: AC
  Administered 2017-05-26: 10 mg via ORAL
  Filled 2017-05-26: qty 1

## 2017-05-26 MED ORDER — CYCLOBENZAPRINE HCL 10 MG PO TABS: 10.0000 mg | ORAL_TABLET | Freq: Three times a day (TID) | ORAL | 0 refills | Status: DC | PRN

## 2017-05-26 MED ORDER — BUTALBITAL-APAP-CAFFEINE 50-325-40 MG PO TABS
1.0000 | ORAL_TABLET | ORAL | Status: DC | PRN
  Administered 2017-05-26: 1 via ORAL
  Filled 2017-05-26: qty 1

## 2017-05-26 NOTE — Discharge Instructions (Signed)

## 2017-05-26 NOTE — ED Triage Notes (Signed)
Patient presents to the ED with a headache that started on Thursday.  Patient reports she has also had intermittent dizziness that was worse with the light and worse when she wakes up in the morning.  Patient denies dizziness at this time.  Patient states headache feels worse/different when she stands or leans forward.

## 2017-05-26 NOTE — ED Notes (Signed)
Pharmacy notified to send compazine dose.

## 2017-05-26 NOTE — ED Provider Notes (Signed)
[facility] Emergency Department Provider Note    First MD Initiated Contact with Patient 05/26/17 1419     (approximate)  I have reviewed the triage vital signs and the nursing notes.   HISTORY  Chief Complaint Headache and Dizziness    HPI Donna Navarro is a 31 y.o. female with a history of hypertension but no recent hospitalizations  Presents with 1 week of intermittent headache primarily in the back of her neck on the right side associated with some photophobia lightheadedness and intermittent dizziness particularly when she stands.  Denies any fevers.  No numbness or tingling.  This is not the worst headache of her life.  There is no sudden onset headache.  Patient states that the headache was mild to moderate at its most severe.  She is tried Tylenol without improvement.  Denies any chance of being pregnant.  Denies any dysuria.  Has had some nausea but otherwise good appetite.  Denies any trauma.  No blurry vision.  Past Medical History:  Diagnosis Date  . Hypertension   . Irregular periods    Family History  Problem Relation Age of Onset  . Heart disease Mother        Massive Heart Attack   Past Surgical History:  Procedure Laterality Date  . CHOLECYSTECTOMY     Patient Active Problem List   Diagnosis Date Noted  . Ventral hernia without obstruction or gangrene 04/30/2017  . Morbid obesity (HCC) 04/30/2017      Prior to Admission medications   Medication Sig Start Date End Date Taking? Authorizing Provider  butalbital-acetaminophen-caffeine (FIORICET, ESGIC) 50-325-40 MG tablet Take 1 tablet by mouth every 6 (six) hours as needed for headache. 05/26/17 05/26/18  Willy Eddy, MD  cyclobenzaprine (FLEXERIL) 10 MG tablet Take 1 tablet (10 mg total) by mouth 3 (three) times daily as needed for muscle spasms. 05/26/17   Willy Eddy, MD  ibuprofen (ADVIL,MOTRIN) 200 MG tablet Take 200 mg every 6 (six) hours as needed by mouth.     [provider]  naproxen (NAPROSYN) 500 MG tablet Take 1 tablet (500 mg total) by mouth 2 (two) times daily with a meal. 05/26/17 05/26/18  Willy Eddy, MD    Allergies Patient has no known allergies.    Social History Social History   Tobacco Use  . Smoking status: Never Smoker  . Smokeless tobacco: Never Used  Substance Use Topics  . Alcohol use: No  . Drug use: Not on file    Review of Systems Patient denies headaches, rhinorrhea, blurry vision, numbness, shortness of breath, chest pain, edema, cough, abdominal pain, nausea, vomiting, diarrhea, dysuria, fevers, rashes or hallucinations unless otherwise stated above in HPI. ____________________________________________   PHYSICAL EXAM:  VITAL SIGNS: Vitals:   05/26/17 1326  BP: (!) 158/101  Pulse: 87  Resp: 20  Temp: 98.7 F (37.1 C)  SpO2: 100%    Constitutional: Alert and oriented. Well appearing and in no acute distress. Eyes: Conjunctivae are normal.  Head: Atraumatic. Nose: No congestion/rhinnorhea. Mouth/Throat: Mucous membranes are moist.   Neck: Painless ROM.  Cardiovascular:   Good peripheral circulation. Respiratory: Normal respiratory effort.  No retractions.  Gastrointestinal: Soft and nontender.  Musculoskeletal: No lower extremity tenderness .  No joint effusions. Neurologic:CN- intact.  No facial droop, Normal FNF.  Normal heel to shin.  Sensation intact bilaterally. Normal speech and language. No gross focal neurologic deficits are appreciated. No gait instability. Skin:  Skin is warm, dry and intact. No  rash noted. Psychiatric: Mood and affect are normal. Speech and behavior are normal.  ____________________________________________   LABS (all labs ordered are listed, but only abnormal results are displayed)  Results for orders placed or performed during the hospital encounter of 05/26/17 (from the past 24 hour(s))  Basic metabolic panel     Status: None   Collection Time:  05/26/17  1:32 PM  Result Value Ref Range   Sodium 138 135 - 145 mmol/L   Potassium 3.7 3.5 - 5.1 mmol/L   Chloride 104 101 - 111 mmol/L   CO2 26 22 - 32 mmol/L   Glucose, Bld 98 65 - 99 mg/dL   BUN 12 6 - 20 mg/dL   Creatinine, Ser 1.610.82 0.44 - 1.00 mg/dL   Calcium 9.1 8.9 - 09.610.3 mg/dL   GFR calc non Af Amer >60 >60 mL/min   GFR calc Af Amer >60 >60 mL/min   Anion gap 8 5 - 15  CBC     Status: Abnormal   Collection Time: 05/26/17  1:32 PM  Result Value Ref Range   WBC 10.0 3.6 - 11.0 K/uL   RBC 4.39 3.80 - 5.20 MIL/uL   Hemoglobin 12.4 12.0 - 16.0 g/dL   HCT 04.537.4 40.935.0 - 81.147.0 %   MCV 85.2 80.0 - 100.0 fL   MCH 28.2 26.0 - 34.0 pg   MCHC 33.1 32.0 - 36.0 g/dL   RDW 91.414.8 (H) 78.211.5 - 95.614.5 %   Platelets 293 150 - 440 K/uL  Urinalysis, Complete w Microscopic     Status: Abnormal   Collection Time: 05/26/17  1:33 PM  Result Value Ref Range   Color, Urine YELLOW (A) YELLOW   APPearance CLOUDY (A) CLEAR   Specific Gravity, Urine 1.025 1.005 - 1.030   pH 5.0 5.0 - 8.0   Glucose, UA NEGATIVE NEGATIVE mg/dL   Hgb urine dipstick NEGATIVE NEGATIVE   Bilirubin Urine NEGATIVE NEGATIVE   Ketones, ur NEGATIVE NEGATIVE mg/dL   Protein, ur NEGATIVE NEGATIVE mg/dL   Nitrite NEGATIVE NEGATIVE   Leukocytes, UA MODERATE (A) NEGATIVE   RBC / HPF 6-30 0 - 5 RBC/hpf   WBC, UA 6-30 0 - 5 WBC/hpf   Bacteria, UA RARE (A) NONE SEEN   Squamous Epithelial / LPF 6-30 (A) NONE SEEN   Mucus PRESENT    ____________________________________________  EKG My review and personal interpretation at Time: 13:31   Indication: dizziness  Rate: 85  Rhythm: sinus Axis: normal Other:  Normal intervals, no stemi ____________________________________________  RADIOLOGY   ____________________________________________   PROCEDURES  Procedure(s) performed:  Procedures    Critical Care performed: no ____________________________________________   INITIAL IMPRESSION / ASSESSMENT AND PLAN / ED  COURSE  Pertinent labs & imaging results that were available during my care of the patient were reviewed by me and considered in my medical decision making (see chart for details).  DDX: Tension, migraine, sinus headache, cluster headache, IPH, occipital neuralgia, dehydration, infection  Unknown FoleyJessica N Dickmann is a 31 y.o. who presents to the ED with with Hx of htn p/w HA for last 7 days. Not worst HA ever. Gradual onset.  Denies focal neurologic symptoms. Denies trauma. No fevers or neck pain. No vision loss. Afebrile in ED. VSS. Exam as above. No meningeal signs. No CN, motor, sensory or cerebellar deficits. Temporal arteries palpable and non-tender. Appears well and non-toxic.  She is offered IV or IM pain medication but is declined this preferring to take a pill.  Will give oral  Compazine and Fioricet.  Patient is not pregnant.  Likely tension, non-specific or possible migraine HA. Clinical picture is not consistent with ICH, SAH, SDH, EDH, TIA, or CVA. No concern for meningitis or encephalitis. No concern for GCA/Temporal arteritis.  Stable to D/C home, follow up with PCP or Neurology if persistent recurrent Has.  Have discussed with the patient and available family all diagnostics and treatments performed thus far and all questions were answered to the best of my ability. The patient demonstrates understanding and agreement with plan.        ____________________________________________   FINAL CLINICAL IMPRESSION(S) / ED DIAGNOSES  Final diagnoses:  Bad headache  Neck pain      NEW MEDICATIONS STARTED DURING THIS VISIT:  This SmartLink is deprecated. Use AVSMEDLIST instead to display the medication list for a patient.   Note:  This document was prepared using Dragon voice recognition software and may include unintentional dictation errors.     Willy Eddyobinson, Esperansa Sarabia, MD 05/26/17 [phone]

## 2017-05-28 LAB — POCT PREGNANCY, URINE: PREG TEST UR: NEGATIVE

## 2017-07-27 ENCOUNTER — Telehealth: Payer: Self-pay | Admitting: General Practice

## 2017-07-27 NOTE — Telephone Encounter (Signed)
Left message for patient to return call to office at this time. 

## 2017-07-27 NOTE — Telephone Encounter (Signed)
Patient is calling said she has been working out due to them telling the patient to try and lose a little weight before surgery. Patient said now her hernia feels like it bothering her a little more than usual. Please call patient and advise.

## 2017-07-27 NOTE — Telephone Encounter (Signed)
Spoke with patient at this time. She states she has joined the gym and she is currently walking on the treadmill and using the stepping machine. She states her abdomen is sore and beginning to notice an increase in pain. We discussed cutting back on the amount of food she eats, and just walking on the treadmill as this will help tremendously with weight loss. She was asked to call office if pain worsens, otherwise continue with follow up to see Dr.Woodham 08/12/17.

## 2017-08-12 ENCOUNTER — Encounter: Payer: Self-pay | Admitting: General Surgery

## 2017-08-12 ENCOUNTER — Emergency Department
Admission: EM | Admit: 2017-08-12 | Discharge: 2017-08-12 | Disposition: A | Discharge status: Home / Self Care | Payer: Medicaid Other | Attending: Emergency Medicine | Admitting: Emergency Medicine

## 2017-08-12 ENCOUNTER — Other Ambulatory Visit: Payer: Self-pay

## 2017-08-12 ENCOUNTER — Ambulatory Visit (INDEPENDENT_AMBULATORY_CARE_PROVIDER_SITE_OTHER): Payer: Self-pay | Admitting: General Surgery

## 2017-08-12 ENCOUNTER — Encounter: Payer: Self-pay | Admitting: Emergency Medicine

## 2017-08-12 VITALS — BP 163/110 | HR 74 | Temp 98.1°F | Ht 68.0 in | Wt 343.0 lb

## 2017-08-12 DIAGNOSIS — I10 Essential (primary) hypertension: Secondary | ICD-10-CM | POA: Diagnosis not present

## 2017-08-12 DIAGNOSIS — K439 Ventral hernia without obstruction or gangrene: Secondary | ICD-10-CM

## 2017-08-12 DIAGNOSIS — M7552 Bursitis of left shoulder: Secondary | ICD-10-CM | POA: Insufficient documentation

## 2017-08-12 DIAGNOSIS — Z79899 Other long term (current) drug therapy: Secondary | ICD-10-CM | POA: Diagnosis not present

## 2017-08-12 DIAGNOSIS — M25512 Pain in left shoulder: Secondary | ICD-10-CM | POA: Diagnosis present

## 2017-08-12 MED ORDER — CYCLOBENZAPRINE HCL 10 MG PO TABS: 10.0000 mg | ORAL_TABLET | Freq: Three times a day (TID) | ORAL | 0 refills | Status: DC | PRN

## 2017-08-12 MED ORDER — MELOXICAM 15 MG PO TABS: 15.0000 mg | ORAL_TABLET | Freq: Every day | ORAL | 2 refills | Status: DC

## 2017-08-12 MED ORDER — TRAMADOL HCL 50 MG PO TABS: 50.0000 mg | ORAL_TABLET | Freq: Four times a day (QID) | ORAL | 0 refills | Status: DC | PRN

## 2017-08-12 MED ORDER — NAPROXEN 500 MG PO TABS
500.0000 mg | ORAL_TABLET | Freq: Once | ORAL | Status: AC
  Administered 2017-08-12: 500 mg via ORAL
  Filled 2017-08-12: qty 1

## 2017-08-12 NOTE — ED Provider Notes (Signed)
[facility] Emergency Department Provider Note   ____________________________________________   First MD Initiated Contact with Patient 08/12/17 [phone]     (approximate)  I have reviewed the triage vital signs and the nursing notes.   HISTORY  Chief Complaint Shoulder Pain    HPI Donna Navarro is a 32 y.o. female patient presents with left shoulder pain which occurred with movement.  Patient had onset of complaint 4 days.  Patient state has a history of bursitis was doing well until weather change.  Patient rates pain as a 7/10.  Patient described the pain is "aching".  Patient denies loss of sensation or loss of movement.  No palliative measures for complaint.   Past Medical History:  Diagnosis Date  . Hypertension   . Irregular periods     Patient Active Problem List   Diagnosis Date Noted  . Ventral hernia without obstruction or gangrene 04/30/2017  . Morbid obesity (HCC) 04/30/2017    Past Surgical History:  Procedure Laterality Date  . CHOLECYSTECTOMY      Prior to Admission medications   Medication Sig Start Date End Date Taking? Authorizing Provider  butalbital-acetaminophen-caffeine (FIORICET, ESGIC) 50-325-40 MG tablet Take 1 tablet by mouth every 6 (six) hours as needed for headache. 05/26/17 05/26/18  Willy Eddy, MD  cyclobenzaprine (FLEXERIL) 10 MG tablet Take 1 tablet (10 mg total) by mouth 3 (three) times daily as needed for muscle spasms. 05/26/17   Willy Eddy, MD  cyclobenzaprine (FLEXERIL) 10 MG tablet Take 1 tablet (10 mg total) by mouth 3 (three) times daily as needed. 08/12/17   Joni Reining, PA-C  ibuprofen (ADVIL,MOTRIN) 200 MG tablet Take 200 mg every 6 (six) hours as needed by mouth.    [provider]  meloxicam (MOBIC) 15 MG tablet Take 1 tablet (15 mg total) by mouth daily. 08/12/17   Joni Reining, PA-C  naproxen (NAPROSYN) 500 MG tablet Take 1 tablet (500 mg total) by mouth 2 (two) times daily  with a meal. 05/26/17 05/26/18  Willy Eddy, MD  traMADol (ULTRAM) 50 MG tablet Take 1 tablet (50 mg total) by mouth every 6 (six) hours as needed. 08/12/17 08/12/18  Joni Reining, PA-C    Allergies Patient has no known allergies.  Family History  Problem Relation Age of Onset  . Heart disease Mother        Massive Heart Attack    Social History Social History   Tobacco Use  . Smoking status: Never Smoker  . Smokeless tobacco: Never Used  Substance Use Topics  . Alcohol use: No  . Drug use: Not on file    Review of Systems Constitutional: No fever/chills Eyes: No visual changes. ENT: No sore throat. Cardiovascular: Denies chest pain. Respiratory: Denies shortness of breath. Gastrointestinal: No abdominal pain.  No nausea, no vomiting.  No diarrhea.  No constipation. Genitourinary: Negative for dysuria. Musculoskeletal: Left shoulder pain Skin: Negative for rash. Neurological: Negative for headaches, focal weakness or numbness.   ____________________________________________   PHYSICAL EXAM:  VITAL SIGNS: ED Triage Vitals  Enc Vitals Group     BP 08/12/17 0641 (!) 155/88     Pulse Rate 08/12/17 0641 82     Resp 08/12/17 0641 18     Temp 08/12/17 0641 98.7 F (37.1 C)     Temp Source 08/12/17 0641 Oral     SpO2 08/12/17 0641 100 %     Weight 08/12/17 0639 (!) 320 lb (145.2 kg)  Height 08/12/17 0639 5\' 8"  (1.727 m)     Head Circumference --      Peak Flow --      Pain Score 08/12/17 0639 7     Pain Loc --      Pain Edu? --      Excl. in GC? --    Constitutional: Alert and oriented. Well appearing and in no acute distress. Cardiovascular: Normal rate, regular rhythm. Grossly normal heart sounds.  Good peripheral circulation.  Elevated blood pressure Respiratory: Normal respiratory effort.  No retractions. Lungs CTAB. Musculoskeletal: No obvious deformity to the left shoulder.  Patient has full range of motion.  Patient strength against resistance  3/5.  Patient has moderate guarding palpation to the [facility].. Neurologic:  Normal speech and language. No gross focal neurologic deficits are appreciated. No gait instability. Skin:  Skin is warm, dry and intact. No rash noted. Psychiatric: Mood and affect are normal. Speech and behavior are normal.  ____________________________________________   LABS (all labs ordered are listed, but only abnormal results are displayed)  Labs Reviewed - No data to display ____________________________________________  EKG   ____________________________________________  RADIOLOGY  ED MD interpretation:    Official radiology report(s): No results found.  ____________________________________________   PROCEDURES  Procedure(s) performed: None  Procedures  Critical Care performed: No  ____________________________________________   INITIAL IMPRESSION / ASSESSMENT AND PLAN / ED COURSE  As part of my medical decision making, I reviewed the following data within the electronic MEDICAL RECORD NUMBER    Chronic left shoulder pain secondary to bursitis.  Patient given discharge care instructions.  Patient advised take medication as directed.  Patient advised to follow-up PCP for continued care.      ____________________________________________   FINAL CLINICAL IMPRESSION(S) / ED DIAGNOSES  Final diagnoses:  Chronic shoulder bursitis, left     ED Discharge Orders        Ordered    traMADol (ULTRAM) 50 MG tablet  Every 6 hours PRN,   Status:  Discontinued     08/12/17 0716    cyclobenzaprine (FLEXERIL) 10 MG tablet  3 times daily PRN,   Status:  Discontinued     08/12/17 0716    meloxicam (MOBIC) 15 MG tablet  Daily,   Status:  Discontinued     08/12/17 0716    cyclobenzaprine (FLEXERIL) 10 MG tablet  3 times daily PRN     08/12/17 0717    meloxicam (MOBIC) 15 MG tablet  Daily     08/12/17 0717    traMADol (ULTRAM) 50 MG tablet  Every 6 hours PRN     08/12/17 [id number]        Note:  This document was prepared using Dragon voice recognition software and may include unintentional dictation errors.    Joni ReiningSmith, Ariyon Mittleman K, PA-C 08/12/17 [phone]    Governor RooksLord, Rebecca, MD 08/12/17 [phone]

## 2017-08-12 NOTE — ED Notes (Signed)
See triage note  Developed pain to left shoulder for the past couple of days  Denies any injury hx of bursitis  States she gets relief with ibu  No deformity noted

## 2017-08-12 NOTE — ED Triage Notes (Signed)
Patient ambulatory to triage with steady gait, without difficulty or distress noted; pt reports left shoulder pain with movement and ROM with hx bursitis

## 2017-08-12 NOTE — Progress Notes (Signed)
Outpatient Surgical Follow Up  08/12/2017  Donna Navarro is an [age] y.o. female.   Chief Complaint  Patient presents with  . Follow-up    Ventral Hernia - discuss surgery date    HPI: Super obese female returns to clinic for follow-up of her ventral hernia.  Patient reports that it is becoming increasingly uncomfortable especially when she works out or works.  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  She does state that when she lays back the area get smaller and that she is eating well and having normal bowel function.  She denies any fevers, chills, nausea, vomiting, chest pain, shortness of breath, diarrhea, constipation.  She has intentionally lost 10 pounds since her last visit.  She attempted to get a referral for bariatric surgery but was told her insurance did not qualify her for it.  Past Medical History:  Diagnosis Date  . Hypertension   . Irregular periods     Past Surgical History:  Procedure Laterality Date  . CHOLECYSTECTOMY      Family History  Problem Relation Age of Onset  . Heart disease Mother        Massive Heart Attack    Social History:  reports that  has never smoked. she has never used smokeless tobacco. She reports that she does not drink alcohol. Her drug history is not on file.  Allergies: No Known Allergies  Medications reviewed.    ROS A multipoint review of systems was completed, all pertinent positives and negatives are documented within the HPI and the remainder are negative   BP (!) 163/110   Pulse 74   Temp 98.1 F (36.7 C) (Oral)   Ht 5\' 8"  (1.727 m)   Wt (!) 155.6 kg (343 lb)   BMI 52.15 kg/m   Physical Exam General: No acute distress Neck: Supple nontender neck Chest: Clear to auscultation Heart: Regular in rhythm Abdomen: Large, soft, nondistended.  Obvious upper midline ventral hernia that slowly reduces.  Minimally tender to palpation  during reduction.    No results found for this or any previous visit (from the past 48 hour(s)). No results found.  Assessment/Plan:  1. Ventral hernia without obstruction or gangrene 32 year old female with a large ventral hernia without evidence of obstruction.  Discussed the signs and symptoms of incarceration or stimulation and to seek immediate follow-up should they occur.  Otherwise discussed the use of an abdominal binder to help with symptoms while she is attempting to lose her weight.  2. Morbid obesity (HCC) Patient continues to have super obesity with a BMI greater than 52 today.  Discussed that this precludes her from being a candidate for an elective hernia repair.  Congratulated her on the loss of 10 pounds.  Encouraged her to continue to lose weight.  She will follow-up in clinic in a few weeks with 1 of my partners for further documentation of weight loss.  We will continue efforts to find an accepting bariatric surgeon.  A total of 15 minutes was used on this encounter with greater than 50% of it used for counseling or coordination of care.   Ricarda Frameharles Winefred Hillesheim, MD FACS General Surgeon  08/12/2017,9:09 AM

## 2017-08-12 NOTE — Patient Instructions (Signed)
We [facility]  Please continue to exercise and lose weight. You are doing a great job. You have lost 10 pounds since your last visit.  Please see your follow up appointment listed below.

## 2017-09-09 ENCOUNTER — Ambulatory Visit: Payer: Medicaid Other | Admitting: Surgery

## 2017-09-17 DIAGNOSIS — N915 Oligomenorrhea, unspecified: Secondary | ICD-10-CM | POA: Insufficient documentation

## 2018-01-21 ENCOUNTER — Other Ambulatory Visit: Payer: Self-pay | Admitting: Physician Assistant

## 2018-01-21 DIAGNOSIS — N926 Irregular menstruation, unspecified: Secondary | ICD-10-CM

## 2018-01-25 ENCOUNTER — Emergency Department: Payer: Self-pay

## 2018-01-25 ENCOUNTER — Other Ambulatory Visit: Payer: Self-pay

## 2018-01-25 ENCOUNTER — Emergency Department
Admission: EM | Admit: 2018-01-25 | Discharge: 2018-01-25 | Disposition: A | Discharge status: Home / Self Care | Payer: Self-pay | Attending: Emergency Medicine | Admitting: Emergency Medicine

## 2018-01-25 ENCOUNTER — Encounter: Payer: Self-pay | Admitting: Intensive Care

## 2018-01-25 DIAGNOSIS — K439 Ventral hernia without obstruction or gangrene: Secondary | ICD-10-CM | POA: Insufficient documentation

## 2018-01-25 DIAGNOSIS — I10 Essential (primary) hypertension: Secondary | ICD-10-CM | POA: Insufficient documentation

## 2018-01-25 DIAGNOSIS — K529 Noninfective gastroenteritis and colitis, unspecified: Secondary | ICD-10-CM | POA: Insufficient documentation

## 2018-01-25 LAB — URINALYSIS, COMPLETE (UACMP) WITH MICROSCOPIC
Bilirubin Urine: NEGATIVE
Glucose, UA: NEGATIVE mg/dL
HGB URINE DIPSTICK: NEGATIVE
Ketones, ur: NEGATIVE mg/dL
NITRITE: NEGATIVE
PROTEIN: NEGATIVE mg/dL
Specific Gravity, Urine: 1.023 (ref 1.005–1.030)
Squamous Epithelial / LPF: >50 — ABNORMAL HIGH (ref 0–5)
pH: 7 (ref 5.0–8.0)

## 2018-01-25 LAB — COMPREHENSIVE METABOLIC PANEL
ALBUMIN: 4 g/dL (ref 3.5–5.0)
ALT: 15 U/L (ref 0–44)
ANION GAP: 5 (ref 5–15)
AST: 17 U/L (ref 15–41)
Alkaline Phosphatase: 71 U/L (ref 38–126)
BILIRUBIN TOTAL: 0.6 mg/dL (ref 0.3–1.2)
BUN: 11 mg/dL (ref 6–20)
CO2: 27 mmol/L (ref 22–32)
Calcium: 8.8 mg/dL — ABNORMAL LOW (ref 8.9–10.3)
Chloride: 106 mmol/L (ref 98–111)
Creatinine, Ser: 0.69 mg/dL (ref 0.44–1.00)
GFR calc Af Amer: >60 mL/min (ref >60)
GFR calc non Af Amer: >60 mL/min (ref >60)
GLUCOSE: 107 mg/dL — AB (ref 70–99)
Potassium: 3.7 mmol/L (ref 3.5–5.1)
Sodium: 138 mmol/L (ref 135–145)
TOTAL PROTEIN: 7.9 g/dL (ref 6.5–8.1)

## 2018-01-25 LAB — LIPASE, BLOOD: LIPASE: 27 U/L (ref 11–51)

## 2018-01-25 LAB — CBC
HEMATOCRIT: 38.5 % (ref 35.0–47.0)
Hemoglobin: 12.8 g/dL (ref 12.0–16.0)
MCH: 28 pg (ref 26.0–34.0)
MCHC: 33.4 g/dL (ref 32.0–36.0)
MCV: 83.9 fL (ref 80.0–100.0)
Platelets: 362 10*3/uL (ref 150–440)
RBC: 4.59 MIL/uL (ref 3.80–5.20)
RDW: 15.5 % — ABNORMAL HIGH (ref 11.5–14.5)
WBC: 17.4 10*3/uL — ABNORMAL HIGH (ref 3.6–11.0)

## 2018-01-25 MED ORDER — CIPROFLOXACIN HCL 500 MG PO TABS: 500.0000 mg | ORAL_TABLET | Freq: Two times a day (BID) | ORAL | 0 refills | Status: DC

## 2018-01-25 MED ORDER — IOPAMIDOL (ISOVUE-300) INJECTION 61%
125.0000 mL | Freq: Once | INTRAVENOUS | Status: AC | PRN
  Administered 2018-01-25: 125 mL via INTRAVENOUS
  Filled 2018-01-25: qty 150

## 2018-01-25 MED ORDER — OXYCODONE-ACETAMINOPHEN 5-325 MG PO TABS: 1.0000 | ORAL_TABLET | Freq: Three times a day (TID) | ORAL | 0 refills | Status: DC | PRN

## 2018-01-25 MED ORDER — SENNOSIDES-DOCUSATE SODIUM 8.6-50 MG PO TABS: 2.0000 | ORAL_TABLET | Freq: Every day | ORAL | 1 refills | Status: DC

## 2018-01-25 MED ORDER — HYDROMORPHONE HCL 1 MG/ML IJ SOLN
1.0000 mg | Freq: Once | INTRAMUSCULAR | Status: AC
  Administered 2018-01-25: 1 mg via INTRAVENOUS
  Filled 2018-01-25: qty 1

## 2018-01-25 MED ORDER — METRONIDAZOLE 500 MG PO TABS: 500.0000 mg | ORAL_TABLET | Freq: Three times a day (TID) | ORAL | 0 refills | Status: DC

## 2018-01-25 MED ORDER — ONDANSETRON HCL 4 MG/2ML IJ SOLN
4.0000 mg | Freq: Once | INTRAMUSCULAR | Status: AC
  Administered 2018-01-25: 4 mg via INTRAVENOUS
  Filled 2018-01-25: qty 2

## 2018-01-25 NOTE — ED Notes (Signed)
ED Provider at bedside. 

## 2018-01-25 NOTE — ED Notes (Signed)
Pt ambulated to ED34 well from lobby with steady gait with this RN. Changed into gown and given warm blanket.

## 2018-01-25 NOTE — ED Notes (Signed)
Urine POC negative per Elmarie Shileyiffany, RN.

## 2018-01-25 NOTE — ED Triage Notes (Signed)
Patient states "I had some drinks last night and I think I irritated my hernia. I have been getting bad cramps and having to just lay still" Reports nausea. Able to ambulate with no problems

## 2018-01-25 NOTE — ED Notes (Signed)
Pt has large hernia that she has had for approx 7 years. States that she gets intermittent "light cramping" distal to her hernia but has been experiencing "very intense cramps" since this morning.

## 2018-01-25 NOTE — ED Provider Notes (Signed)
[facility] Emergency Department Provider Note       Time seen: ----------------------------------------- 4:36 PM on 01/25/2018 -----------------------------------------   I have reviewed the triage vital signs and the nursing notes.  HISTORY   Chief Complaint Abdominal Pain    HPI Donna Navarro is a 32 y.o. female with a history of hypertension, morbid obesity and ventral hernia who presents to the ED for abdominal pain and pain from her hernia.  Patient states she has had a large ventral hernia for the past 7 years.  Intermittently she has like cramping due to the hernia but this morning experienced severe cramps.  She denies fevers, chills or other complaints.  Past Medical History:  Diagnosis Date  . Hypertension   . Irregular periods   . Morbid obesity (HCC) 04/30/2017  . Ventral hernia without obstruction or gangrene 04/30/2017    Patient Active Problem List   Diagnosis Date Noted  . Ventral hernia without obstruction or gangrene 04/30/2017  . Morbid obesity (HCC) 04/30/2017    Past Surgical History:  Procedure Laterality Date  . CHOLECYSTECTOMY      Allergies Patient has no known allergies.  Social History Social History   Tobacco Use  . Smoking status: Never Smoker  . Smokeless tobacco: Never Used  Substance Use Topics  . Alcohol use: Yes  . Drug use: Not on file   Review of Systems Constitutional: Negative for fever. Cardiovascular: Negative for chest pain. Respiratory: Negative for shortness of breath. Gastrointestinal: Positive for abdominal pain, nausea Musculoskeletal: Negative for back pain. Skin: Negative for rash. Neurological: Negative for headaches, focal weakness or numbness.  All systems negative/normal/unremarkable except as stated in the HPI  ____________________________________________   PHYSICAL EXAM:  VITAL SIGNS: ED Triage Vitals [01/25/18 1502]  Enc Vitals Group     BP (!) 153/87     Pulse  Rate 87     Resp 18     Temp 98.2 F (36.8 C)     Temp Source Oral     SpO2 100 %     Weight (!) 306 lb (138.8 kg)     Height 5\' 7"  (1.702 m)     Head Circumference      Peak Flow      Pain Score 5     Pain Loc      Pain Edu?      Excl. in GC?    Constitutional: Alert and oriented.  Mild distress Eyes: Conjunctivae are normal. Normal extraocular movements. ENT   Head: Normocephalic and atraumatic.   Nose: No congestion/rhinnorhea.   Mouth/Throat: Mucous membranes are moist.   Neck: No stridor. Cardiovascular: Normal rate, regular rhythm. No murmurs, rubs, or gallops. Respiratory: Normal respiratory effort without tachypnea nor retractions. Breath sounds are clear and equal bilaterally. No wheezes/rales/rhonchi. Gastrointestinal: Large tender ventral hernia just to the right of midline in the periumbilical area, not easily reducible.  Normal bowel sounds. Musculoskeletal: Nontender with normal range of motion in extremities. No lower extremity tenderness nor edema. Neurologic:  Normal speech and language. No gross focal neurologic deficits are appreciated.  Skin:  Skin is warm, dry and intact. No rash noted. Psychiatric: Mood and affect are normal. Speech and behavior are normal.  ____________________________________________  ED COURSE:  As part of my medical decision making, I reviewed the following data within the electronic MEDICAL RECORD NUMBER History obtained from family if available, nursing notes, old chart and ekg, as well as notes from prior ED visits. Patient presented for  abdominal pain and pain related to her ventral hernia, we will assess with labs and imaging as indicated at this time. Clinical Course as of Jan 26 1828  Sheral FlowMon Jan 25, 2018  1705 I can only partially reduce her ventral hernia, he remains tender to touch, we will proceed with CT imaging   [JW]    Clinical Course User Index [JW] Emily FilbertWilliams, Krystine Pabst E, MD    Procedures ____________________________________________   LABS (pertinent positives/negatives)  Labs Reviewed  COMPREHENSIVE METABOLIC PANEL - Abnormal; Notable for the following components:      Result Value   Glucose, Bld 107 (*)    Calcium 8.8 (*)    All other components within normal limits  CBC - Abnormal; Notable for the following components:   WBC 17.4 (*)    RDW 15.5 (*)    All other components within normal limits  URINALYSIS, COMPLETE (UACMP) WITH MICROSCOPIC - Abnormal; Notable for the following components:   Color, Urine YELLOW (*)    APPearance CLOUDY (*)    Leukocytes, UA SMALL (*)    Bacteria, UA RARE (*)    Squamous Epithelial / LPF >50 (*)    All other components within normal limits  LIPASE, BLOOD  POC URINE PREG, ED    RADIOLOGY Images were viewed by me  CT of the abdomen pelvis with contrast IMPRESSION: 1. Moderately large right supraumbilical hernia measuring 13.3 x 8.4 x 11.6 cm through which fat and vessels traverse as well as proximal to mid transverse colon. More transverse colon herniates than on prior exam. The transverse colon extending into the hernia may be minimally inflamed. This is not causing proximal obstruction at the current time. 2. Slightly prominent intrahepatic biliary ducts similar to prior exam without obstructing stone or mass noted. This may be related to post cholecystectomy state. If patient had elevated liver function studies than this could be further assessed with MRCP. ____________________________________________  DIFFERENTIAL DIAGNOSIS   Incarcerated hernia, strength elation, intra-abdominal infection, UTI, constipation, gas pain  FINAL ASSESSMENT AND PLAN  Ventral hernia   Plan: The patient had presented for pain secondary to her previously known ventral hernia. Patient's labs did reveal leukocytosis of uncertain etiology. Patient's imaging did not reveal any obstruction or incarceration but she does have  some inflammation along the transverse colon.  I did discuss this with general surgery, we will try antibiotics for generic colitis and she will be seen in the office.  She will likely need surgery to repair this defect as it is causing her more pain.  She is cleared for outpatient follow-up.   Ulice DashJohnathan E Deserie Dirks, MD   Note: This note was generated in part or whole with voice recognition software. Voice recognition is usually quite accurate but there are transcription errors that can and very often do occur. I apologize for any typographical errors that were not detected and corrected.     Emily FilbertWilliams, Claretta Kendra E, MD 01/25/18 [id number]

## 2018-01-27 ENCOUNTER — Other Ambulatory Visit: Payer: Self-pay

## 2018-01-27 ENCOUNTER — Ambulatory Visit
Admission: RE | Admit: 2018-01-27 | Discharge: 2018-01-27 | Disposition: A | Discharge status: Home / Self Care | Payer: Self-pay | Source: Ambulatory Visit | Attending: Physician Assistant | Admitting: Physician Assistant

## 2018-01-27 DIAGNOSIS — N926 Irregular menstruation, unspecified: Secondary | ICD-10-CM | POA: Insufficient documentation

## 2018-01-27 DIAGNOSIS — N83202 Unspecified ovarian cyst, left side: Secondary | ICD-10-CM | POA: Insufficient documentation

## 2018-02-01 ENCOUNTER — Ambulatory Visit (INDEPENDENT_AMBULATORY_CARE_PROVIDER_SITE_OTHER): Payer: Self-pay | Admitting: Surgery

## 2018-02-01 ENCOUNTER — Encounter: Payer: Self-pay | Admitting: Surgery

## 2018-02-01 VITALS — BP 118/78 | HR 80 | Temp 97.8°F | Wt 300.0 lb

## 2018-02-01 DIAGNOSIS — K439 Ventral hernia without obstruction or gangrene: Secondary | ICD-10-CM

## 2018-02-01 NOTE — Patient Instructions (Signed)
Return in one month.  

## 2018-02-01 NOTE — Progress Notes (Signed)
Patient ID: Donna Navarro, female   DOB: 11/06/1985, 32 y.o.   MRN: [id number]  HPI Donna Navarro is a 32 y.o. female seen after she was see in the ER by Dr. Mayford KnifeWilliams.  Worsening on the ventral hernia symptoms.  She reports some intermittent pain associated with some decreased appetite.  No fevers no chills no evidence of obstruction.  CT scan personally review showing evidence for large chronically incarcerated ventral hernia.  No evidence of obstruction.  Her labs were review her CBC and CMP were completely normal.  She did have mild biliary dilation.  She was seen before and at that time was discussed about weight management.  Since then she has lost about 53 pounds.  She continues to do well on exercise and diet program. He reports that the pain sometimes gets worse after certain movements or after doing Valsalva maneuvers. He did have a cholecystectomy about 6 to 7 years ago.  HPI  Past Medical History:  Diagnosis Date  . Hypertension   . Irregular periods   . Morbid obesity (HCC) 04/30/2017  . Ventral hernia without obstruction or gangrene 04/30/2017    Past Surgical History:  Procedure Laterality Date  . CHOLECYSTECTOMY      Family History  Problem Relation Age of Onset  . Heart disease Mother        Massive Heart Attack    Social History Social History   Tobacco Use  . Smoking status: Never Smoker  . Smokeless tobacco: Never Used  Substance Use Topics  . Alcohol use: Yes  . Drug use: Not on file    No Known Allergies  Current Outpatient Medications  Medication Sig Dispense Refill  . butalbital-acetaminophen-caffeine (FIORICET, ESGIC) 50-325-40 MG tablet Take 1 tablet by mouth every 6 (six) hours as needed for headache. 4 tablet 0  . cyclobenzaprine (FLEXERIL) 10 MG tablet Take 1 tablet (10 mg total) by mouth 3 (three) times daily as needed. 15 tablet 0  . ibuprofen (ADVIL,MOTRIN) 200 MG tablet Take 200 mg every 6 (six) hours as needed by mouth.    .  medroxyPROGESTERone (PROVERA) 10 MG tablet Take by mouth.    . meloxicam (MOBIC) 15 MG tablet Take 1 tablet (15 mg total) by mouth daily. 30 tablet 2  . metroNIDAZOLE (FLAGYL) 500 MG tablet Take 1 tablet (500 mg total) by mouth 3 (three) times daily. 30 tablet 0  . naproxen (NAPROSYN) 500 MG tablet Take 1 tablet (500 mg total) by mouth 2 (two) times daily with a meal. 20 tablet 0  . oxyCODONE-acetaminophen (PERCOCET) 5-325 MG tablet Take 1 tablet by mouth every 8 (eight) hours as needed. 20 tablet 0  . senna-docusate (SENOKOT-S) 8.6-50 MG tablet Take 2 tablets by mouth at bedtime. 30 tablet 1  . traMADol (ULTRAM) 50 MG tablet Take 1 tablet (50 mg total) by mouth every 6 (six) hours as needed. 20 tablet 0   No current facility-administered medications for this visit.      Review of Systems Full ROS  was asked and was negative except for the information on the HPI  Physical Exam Blood pressure 118/78, pulse 80, temperature 97.8 F (36.6 C), temperature source Oral, weight 300 lb (136.1 kg), last menstrual period 01/04/2018. CONSTITUTIONAL: NAD, obese female EYES: Pupils are equal, round, and reactive to light, Sclera are non-icteric. EARS, NOSE, MOUTH AND THROAT: The oropharynx is clear. The oral mucosa is pink and moist. Hearing is intact to voice. LYMPH NODES:  Lymph nodes in the  neck are normal. RESPIRATORY:  Lungs are clear. There is normal respiratory effort, with equal breath sounds bilaterally, and without pathologic use of accessory muscles. CARDIOVASCULAR: Heart is regular without murmurs, gallops, or rubs. GI: The abdomen is soft, large chronically incarcerated ventral hernia.  No peritonitis no evidence of obstruction.   GU: Rectal deferred.   MUSCULOSKELETAL: Normal muscle strength and tone. No cyanosis or edema.   SKIN: Turgor is good and there are no pathologic skin lesions or ulcers. NEUROLOGIC: Motor and sensation is grossly normal. Cranial nerves are grossly intact. PSYCH:   Oriented to person, place and time. Affect is normal.  Data Reviewed  I have personally reviewed the patient's imaging, laboratory findings and medical records.    Assessment/ Plan Symptomatic ventral hernia on a morbidly obese female. I  Had a discussion with the patient regarding her weight and she is committed to losing more weight.  I stated that if she manages to be around 280 pounds I will be willing to perform the hernia repair robotically with mesh.  She currently is not diabetic she is not a smoker and her only risk factor is morbid obesity.  She understands and she is committed to continuing her weight loss and diet. I will see her back in 1 month at that time determine whether or not she is a good operative candidate.  Had a lengthy discussion with the patient about my thought process and she understands. No need for emergent surgical intervention at this time.  Sterling Bigiego Pabon, MD FACS General Surgeon 02/01/2018, 1:05 PM

## 2018-03-03 ENCOUNTER — Ambulatory Visit: Payer: Self-pay | Admitting: Surgery

## 2018-03-05 ENCOUNTER — Emergency Department
Admission: EM | Admit: 2018-03-05 | Discharge: 2018-03-05 | Disposition: A | Discharge status: Home / Self Care | Payer: Self-pay | Attending: Emergency Medicine | Admitting: Emergency Medicine

## 2018-03-05 ENCOUNTER — Other Ambulatory Visit: Payer: Self-pay

## 2018-03-05 ENCOUNTER — Encounter: Payer: Self-pay | Admitting: Emergency Medicine

## 2018-03-05 DIAGNOSIS — K439 Ventral hernia without obstruction or gangrene: Secondary | ICD-10-CM | POA: Insufficient documentation

## 2018-03-05 DIAGNOSIS — I10 Essential (primary) hypertension: Secondary | ICD-10-CM | POA: Insufficient documentation

## 2018-03-05 DIAGNOSIS — Z79899 Other long term (current) drug therapy: Secondary | ICD-10-CM | POA: Insufficient documentation

## 2018-03-05 LAB — POCT PREGNANCY, URINE: Preg Test, Ur: NEGATIVE

## 2018-03-05 LAB — COMPREHENSIVE METABOLIC PANEL
ALT: 33 U/L (ref 0–44)
AST: 25 U/L (ref 15–41)
Albumin: 4 g/dL (ref 3.5–5.0)
Alkaline Phosphatase: 69 U/L (ref 38–126)
Anion gap: 7 (ref 5–15)
BUN: 15 mg/dL (ref 6–20)
CHLORIDE: 106 mmol/L (ref 98–111)
CO2: 26 mmol/L (ref 22–32)
CREATININE: 0.72 mg/dL (ref 0.44–1.00)
Calcium: 9.2 mg/dL (ref 8.9–10.3)
GFR calc non Af Amer: >60 mL/min (ref >60)
Glucose, Bld: 98 mg/dL (ref 70–99)
POTASSIUM: 3.7 mmol/L (ref 3.5–5.1)
SODIUM: 139 mmol/L (ref 135–145)
Total Bilirubin: 0.7 mg/dL (ref 0.3–1.2)
Total Protein: 7.6 g/dL (ref 6.5–8.1)

## 2018-03-05 LAB — URINALYSIS, COMPLETE (UACMP) WITH MICROSCOPIC
BILIRUBIN URINE: NEGATIVE
Glucose, UA: NEGATIVE mg/dL
Hgb urine dipstick: NEGATIVE
KETONES UR: NEGATIVE mg/dL
Nitrite: NEGATIVE
PROTEIN: NEGATIVE mg/dL
Specific Gravity, Urine: 1.017 (ref 1.005–1.030)
pH: 6 (ref 5.0–8.0)

## 2018-03-05 LAB — CBC
HEMATOCRIT: 37.3 % (ref 35.0–47.0)
HEMOGLOBIN: 12.3 g/dL (ref 12.0–16.0)
MCH: 28.4 pg (ref 26.0–34.0)
MCHC: 33 g/dL (ref 32.0–36.0)
MCV: 86 fL (ref 80.0–100.0)
PLATELETS: 306 10*3/uL (ref 150–440)
RBC: 4.34 MIL/uL (ref 3.80–5.20)
RDW: 16 % — ABNORMAL HIGH (ref 11.5–14.5)
WBC: 12.6 10*3/uL — AB (ref 3.6–11.0)

## 2018-03-05 LAB — LIPASE, BLOOD: LIPASE: 26 U/L (ref 11–51)

## 2018-03-05 NOTE — Discharge Instructions (Addendum)
If you have significantly worsening pain, redness, vomiting, fever, inability to have bowel movements, inability to eat, or you feel worse in any way including fevers return immediately to the emergency department.  Take stool softeners over-the-counter, be careful straining when you pick things up, and follow closely with your surgeon.  At this time, there is no indication for acute repair of your hernia, but if other symptoms develop that certainly could change so we asked you to be vigilant with it.

## 2018-03-05 NOTE — ED Provider Notes (Addendum)
[facility] Emergency Department Provider Note  ____________________________________________   I have reviewed the triage vital signs and the nursing notes. Where available I have reviewed prior notes and, if possible and indicated, outside hospital notes.    HISTORY  Chief Complaint Abdominal Pain    HPI Donna Navarro is a 32 y.o. female  Who suffers from morbid obesity although she states she is lost about 45 to 50 pounds in the last 6 months by drinking water and walking.  She has a chronic ventral hernia which is been there for 8 years.  She can always get it back in but is always uncomfortable and comes out.  She is been able to get by in and out over the last couple days but is uncomfortable as always.  Sometimes it makes her nauseated.  She denies any fever or chills.  No diarrhea normal bowel movements.  She denies any melena or bright red blood per rectum or hematemesis.  She states she just tired of having this hernia.  She feels that now that she has lost weight, which was told to her as a precondition for possible surgery, perhaps or something more that can be done.    Past Medical History:  Diagnosis Date  . Hypertension   . Irregular periods   . Morbid obesity (HCC) 04/30/2017  . Ventral hernia without obstruction or gangrene 04/30/2017    Patient Active Problem List   Diagnosis Date Noted  . 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 Ventral hernia without obstruction or gangrene 04/30/2017  . Morbid obesity (HCC) 04/30/2017    Past Surgical History:  Procedure Laterality Date  . CHOLECYSTECTOMY      Prior to Admission medications   Medication Sig Start Date End Date Taking? Authorizing Provider  butalbital-acetaminophen-caffeine (FIORICET, ESGIC) 50-325-40 MG tablet Take 1 tablet by mouth every 6 (six) hours as needed for headache. 05/26/17 05/26/18  Willy Eddy, MD  cyclobenzaprine (FLEXERIL) 10 MG tablet Take 1 tablet (10 mg total) by mouth  3 (three) times daily as needed. 08/12/17   Joni Reining, PA-C  ibuprofen (ADVIL,MOTRIN) 200 MG tablet Take 200 mg every 6 (six) hours as needed by mouth.    [provider]  medroxyPROGESTERone (PROVERA) 10 MG tablet Take by mouth. 09/11/17 09/11/18  [provider]  meloxicam (MOBIC) 15 MG tablet Take 1 tablet (15 mg total) by mouth daily. 08/12/17   Joni Reining, PA-C  metroNIDAZOLE (FLAGYL) 500 MG tablet Take 1 tablet (500 mg total) by mouth 3 (three) times daily. 01/25/18   Emily Filbert, MD  naproxen (NAPROSYN) 500 MG tablet Take 1 tablet (500 mg total) by mouth 2 (two) times daily with a meal. 05/26/17 05/26/18  Willy Eddy, MD  oxyCODONE-acetaminophen (PERCOCET) 5-325 MG tablet Take 1 tablet by mouth every 8 (eight) hours as needed. 01/25/18   Emily Filbert, MD  senna-docusate (SENOKOT-S) 8.6-50 MG tablet Take 2 tablets by mouth at bedtime. 01/25/18 01/25/19  Emily Filbert, MD  traMADol (ULTRAM) 50 MG tablet Take 1 tablet (50 mg total) by mouth every 6 (six) hours as needed. 08/12/17 08/12/18  Joni Reining, PA-C    Allergies Patient has no known allergies.  Family History  Problem Relation Age of Onset  . Heart disease Mother        Massive Heart Attack    Social History Social History   Tobacco Use  . Smoking status: Never Smoker  . Smokeless tobacco: Never Used  Substance Use  Topics  . Alcohol use: Yes  . Drug use: Not on file    Review of Systems Constitutional: No fever/chills Eyes: No visual changes. ENT: No sore throat. No stiff neck no neck pain Cardiovascular: Denies chest pain. Respiratory: Denies shortness of breath. Gastrointestinal:   no vomiting.  No diarrhea.  No constipation. Genitourinary: Negative for dysuria. Musculoskeletal: Negative lower extremity swelling Skin: Negative for rash. Neurological: Negative for severe headaches, focal weakness or  numbness.   ____________________________________________   PHYSICAL EXAM:  VITAL SIGNS: ED Triage Vitals  Enc Vitals Group     BP 03/05/18 0612 (!) 136/93     Pulse Rate 03/05/18 0612 67     Resp 03/05/18 0612 18     Temp 03/05/18 0612 98.3 F (36.8 C)     Temp Source 03/05/18 0612 Oral     SpO2 03/05/18 0612 100 %     Weight 03/05/18 0608 300 lb (136.1 kg)     Height 03/05/18 0608 5\' 8"  (1.727 m)     Head Circumference --      Peak Flow --      Pain Score 03/05/18 0607 5     Pain Loc --      Pain Edu? --      Excl. in GC? --     Constitutional: Alert and oriented. Well appearing and in no acute distress. Eyes: Conjunctivae are normal Head: Atraumatic HEENT: No congestion/rhinnorhea. Mucous membranes are moist.  Oropharynx non-erythematous Neck:   Nontender with no meningismus, no masses, no stridor Cardiovascular: Normal rate, regular rhythm. Grossly normal heart sounds.  Good peripheral circulation. Respiratory: Normal respiratory effort.  No retractions. Lungs CTAB. Abdominal: Soft and nontender, there is a large ventral hernia, it is soft, not erythematous not indurated. No distention. No guarding no rebound Back:  There is no focal tenderness or step off.  there is no midline tenderness there are no lesions noted. there is no CVA tenderness Musculoskeletal: No lower extremity tenderness, no upper extremity tenderness. No joint effusions, no DVT signs strong distal pulses no edema Neurologic:  Normal speech and language. No gross focal neurologic deficits are appreciated.  Skin:  Skin is warm, dry and intact. No rash noted. Psychiatric: Mood and affect are normal. Speech and behavior are normal.  ____________________________________________   LABS (all labs ordered are listed, but only abnormal results are displayed)  Labs Reviewed  CBC - Abnormal; Notable for the following components:      Result Value   WBC 12.6 (*)    RDW 16.0 (*)    All other components  within normal limits  URINALYSIS, COMPLETE (UACMP) WITH MICROSCOPIC - Abnormal; Notable for the following components:   Color, Urine YELLOW (*)    APPearance CLOUDY (*)    Leukocytes, UA MODERATE (*)    Bacteria, UA RARE (*)    All other components within normal limits  LIPASE, BLOOD  COMPREHENSIVE METABOLIC PANEL  POC URINE PREG, ED  POCT PREGNANCY, URINE    Pertinent labs  results that were available during my care of the patient were reviewed by me and considered in my medical decision making (see chart for details). ____________________________________________  EKG  I personally interpreted any EKGs ordered by me or triage  ____________________________________________  RADIOLOGY  Pertinent labs & imaging results that were available during my care of the patient were reviewed by me and considered in my medical decision making (see chart for details). If possible, patient and/or family made aware of any abnormal  findings.  No results found. ____________________________________________    PROCEDURES  Procedure(s) performed: None  Procedures  Critical Care performed: None  ____________________________________________   INITIAL IMPRESSION / ASSESSMENT AND PLAN / ED COURSE  Pertinent labs & imaging results that were available during my care of the patient were reviewed by me and considered in my medical decision making (see chart for details).  Patient with chronic ventral hernia, it is certainly well-appearing, I am placing her in mild Trendelenburg and applying gentle pressure to see if I can get it to reduce.  It is not incarcerated or erythematous, and it does not tend to slide in and out on its own she states.  Been out for couple days this time which is not atypical.  If it reduces as I anticipate, we will talk to surgery about seeing if she can get outpatient follow-up.  ----------------------------------------- 9:59 AM on  03/05/2018 -----------------------------------------  I was able to get hernia mostly reduced but does come right back out.  She states this is how it has been for 8 years.  I did discuss with Dr. Excell Seltzerooper.  He agrees that clinically there is no evidence of incarceration or dead gut, and does not feel the patient necessarily requires imaging as a result.  He does not feel the patient requires any emergent intervention and I agree at this time she had a CT scan for this exact same symptoms about a month ago.  Patient is here because she feels that she hopefully is lost enough weight to have the procedure done.  She does have an outpatient relationship with a Careers advisersurgeon.  She is not vomiting here.  Her abdomen is certainly nonsurgical and she has very minimal tenderness associated with this chronic hernia.  I have advised her that if there are signs of incarceration I have explained to her what those are, she needs to return to the emergency room.  Otherwise, we are very comfortable sending her home with close outpatient follow-up.  Serial exams on the abdomen do not betray any evidence of significant tenderness, there is no evidence of obstructive pathology and there is no evidence of incarceration.  It is not possible that could develop in the future, however, so I have advised her strongly to return for any of the symptoms consistent with those findings.  No other interabdominal pathology at this time is suspected such as appendicitis etc., and her abdomen is quite benign.    ____________________________________________   FINAL CLINICAL IMPRESSION(S) / ED DIAGNOSES  Final diagnoses:  None      This chart was dictated using voice recognition software.  Despite best efforts to proofread,  errors can occur which can change meaning.      Jeanmarie PlantMcShane, Alixandra Alfieri A, MD 03/05/18 [id number]    Jeanmarie PlantMcShane, Livvy Spilman A, MD 03/05/18 1003

## 2018-03-05 NOTE — ED Triage Notes (Signed)
Patient ambulatory to triage with steady gait, without difficulty or distress noted; pt reports has umbilical hernia, causing burning pain x 3 days accomp by nausea

## 2018-03-08 ENCOUNTER — Ambulatory Visit: Payer: Self-pay | Admitting: Surgery

## 2018-04-12 ENCOUNTER — Ambulatory Visit: Payer: Self-pay | Admitting: Surgery

## 2018-04-15 ENCOUNTER — Encounter: Payer: Self-pay | Admitting: Emergency Medicine

## 2018-04-15 ENCOUNTER — Emergency Department: Payer: Medicaid Other

## 2018-04-15 ENCOUNTER — Emergency Department
Admission: EM | Admit: 2018-04-15 | Discharge: 2018-04-15 | Disposition: A | Discharge status: Home / Self Care | Payer: Medicaid Other | Attending: Emergency Medicine | Admitting: Emergency Medicine

## 2018-04-15 ENCOUNTER — Other Ambulatory Visit: Payer: Self-pay

## 2018-04-15 DIAGNOSIS — Z3201 Encounter for pregnancy test, result positive: Secondary | ICD-10-CM | POA: Insufficient documentation

## 2018-04-15 DIAGNOSIS — Z79899 Other long term (current) drug therapy: Secondary | ICD-10-CM | POA: Diagnosis not present

## 2018-04-15 DIAGNOSIS — I10 Essential (primary) hypertension: Secondary | ICD-10-CM | POA: Diagnosis not present

## 2018-04-15 DIAGNOSIS — K439 Ventral hernia without obstruction or gangrene: Secondary | ICD-10-CM

## 2018-04-15 DIAGNOSIS — K436 Other and unspecified ventral hernia with obstruction, without gangrene: Secondary | ICD-10-CM | POA: Insufficient documentation

## 2018-04-15 DIAGNOSIS — R079 Chest pain, unspecified: Secondary | ICD-10-CM | POA: Diagnosis present

## 2018-04-15 LAB — URINALYSIS, COMPLETE (UACMP) WITH MICROSCOPIC
Bilirubin Urine: NEGATIVE
GLUCOSE, UA: NEGATIVE mg/dL
Ketones, ur: NEGATIVE mg/dL
NITRITE: NEGATIVE
PH: 6 (ref 5.0–8.0)
Protein, ur: NEGATIVE mg/dL
SPECIFIC GRAVITY, URINE: 1.02 (ref 1.005–1.030)

## 2018-04-15 LAB — CBC
HEMATOCRIT: 38 % (ref 36.0–46.0)
HEMOGLOBIN: 12.3 g/dL (ref 12.0–15.0)
MCH: 28.1 pg (ref 26.0–34.0)
MCHC: 32.4 g/dL (ref 30.0–36.0)
MCV: 87 fL (ref 80.0–100.0)
PLATELETS: 336 10*3/uL (ref 150–400)
RBC: 4.37 MIL/uL (ref 3.87–5.11)
RDW: 14.7 % (ref 11.5–15.5)
WBC: 13.7 10*3/uL — AB (ref 4.0–10.5)
nRBC: 0 % (ref 0.0–0.2)

## 2018-04-15 LAB — COMPREHENSIVE METABOLIC PANEL
ALBUMIN: 3.7 g/dL (ref 3.5–5.0)
ALK PHOS: 59 U/L (ref 38–126)
ALT: 20 U/L (ref 0–44)
AST: 17 U/L (ref 15–41)
Anion gap: 7 (ref 5–15)
BILIRUBIN TOTAL: 0.5 mg/dL (ref 0.3–1.2)
BUN: 13 mg/dL (ref 6–20)
CALCIUM: 8.9 mg/dL (ref 8.9–10.3)
CO2: 26 mmol/L (ref 22–32)
Chloride: 109 mmol/L (ref 98–111)
Creatinine, Ser: 0.71 mg/dL (ref 0.44–1.00)
GFR calc Af Amer: >60 mL/min (ref >60)
GFR calc non Af Amer: >60 mL/min (ref >60)
Glucose, Bld: 100 mg/dL — ABNORMAL HIGH (ref 70–99)
POTASSIUM: 3.7 mmol/L (ref 3.5–5.1)
Sodium: 142 mmol/L (ref 135–145)
TOTAL PROTEIN: 7 g/dL (ref 6.5–8.1)

## 2018-04-15 LAB — LIPASE, BLOOD: Lipase: 24 U/L (ref 11–51)

## 2018-04-15 LAB — TROPONIN I

## 2018-04-15 LAB — PREGNANCY, URINE: Preg Test, Ur: POSITIVE — AB

## 2018-04-15 LAB — HCG, QUANTITATIVE, PREGNANCY: hCG, Beta Chain, Quant, S: 768 m[IU]/mL — ABNORMAL HIGH (ref <5)

## 2018-04-15 MED ORDER — FAMOTIDINE 20 MG PO TABS
20.0000 mg | ORAL_TABLET | Freq: Once | ORAL | Status: AC
  Administered 2018-04-15: 20 mg via ORAL
  Filled 2018-04-15: qty 1

## 2018-04-15 MED ORDER — ONDANSETRON 4 MG PO TBDP
4.0000 mg | ORAL_TABLET | Freq: Once | ORAL | Status: AC
  Administered 2018-04-15: 4 mg via ORAL
  Filled 2018-04-15: qty 1

## 2018-04-15 MED ORDER — LIDOCAINE VISCOUS HCL 2 % MT SOLN
15.0000 mL | Freq: Once | OROMUCOSAL | Status: AC
  Administered 2018-04-15: 15 mL via ORAL
  Filled 2018-04-15: qty 15

## 2018-04-15 MED ORDER — ALUM & MAG HYDROXIDE-SIMETH 200-200-20 MG/5ML PO SUSP
30.0000 mL | Freq: Once | ORAL | Status: AC
  Administered 2018-04-15: 30 mL via ORAL
  Filled 2018-04-15: qty 30

## 2018-04-15 NOTE — Care Management Note (Signed)
Case Management Note  Patient Details  Name: Donna Navarro MRN: [id number] Date of Birth: 1986-01-19  Subjective/Objective:        Patient is seen in the ED today for abdominal pain and nausea.  Patient reports that she feels much better now and is ready to go home.  RNCM notes that this is the 3rd ED visit for Abd pain in the last 3 months.  Patient reports that she is being followed by [facility], her last visit was about 3 months ago and she needs to schedule a follow up appointment.  She does not remember the last time she went to [facility], she states that she went to be seen there the other day but they told her that her copay would be $150 and she did not have it at the time.  [facility] told her to bring information from her job- copays are based on income.  Patient works at [employer] part time, she would like to try and get on full time and then she would have benefits.  She does not have any insurance at this time.  She lives at home with her boyfriend and their 2 children, one is 71 y/o and the other is 53 y/o.  Her boyfriend drives and she is in the process of getting her driver's license she has her permit just needs to take the driving test at the [facility].  She reports that she has reliable transportation.  She receives her prescriptions from the [facility] clinic and they are affordable.    RNCM gave the patient [facility] application, she can call [facility] if she is interested.  RNCM told patient that she needs to schedule a follow up with PCP and [facility] and patient verifies understanding.              Action/Plan:  Discharge to home  Expected Discharge Date:                  Expected Discharge Plan:  Home/Self Care  In-House Referral:     Discharge planning Services  CM Consult  Post Acute Care Choice:    Choice offered to:     DME Arranged:    DME Agency:     HH Arranged:    HH Agency:     Status of Service:  Completed, signed off  If  discussed at [employer] of Stay Meetings, dates discussed:    Additional Comments:  Allayne Butcher, RN 04/15/2018, 12:06 PM

## 2018-04-15 NOTE — ED Triage Notes (Addendum)
Patient ambulatory to triage with steady gait, without difficulty or distress noted; pt reports mid CP radiating into back accomp by nausea since yesterday; also st some mid upper abd pain with hx hernia

## 2018-04-15 NOTE — ED Provider Notes (Signed)
[facility] Emergency Department Provider Note ____________________________________________   First MD Initiated Contact with Patient 04/15/18 [phone]     (approximate)  I have reviewed the triage vital signs and the nursing notes.   HISTORY  Chief Complaint Chest Pain  HPI Donna Navarro is a 32 y.o. female history of known supraumbilical hernia, morbid obesity, hypertension.  Patient presents today reports she is experiencing for about 3 days now feeling of slight nausea, discomfort in her lower chest a very upper abdomen the top of an area where she has a large hernia.  Reports a slight discomfort that radiates out a little bit towards the right side of her stomach with it.  Nausea comes and goes the pain seems to be fairly consistent the last 3 days.  She reports her hernia does not look any different than normal, these types of symptoms happen on and off for several months now and her hernia is been present for about 7 years.  Reports the hernia itself does not seem to be tender except at the very top with a little uncomfortable, but when pressing on it seems to worsen the symptoms of slight nausea and discomfort  She was supposed to see general surgery but cannot call them to work, and reports that   Past Medical History:  Diagnosis Date  . Hypertension   . Irregular periods   . Morbid obesity (HCC) 04/30/2017  . Ventral hernia without obstruction or gangrene 04/30/2017    Patient Active Problem List   Diagnosis Date Noted  . 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 Ventral hernia without obstruction or gangrene 04/30/2017  . Morbid obesity (HCC) 04/30/2017    Past Surgical History:  Procedure Laterality Date  . CHOLECYSTECTOMY      Prior to Admission medications   Medication Sig Start Date End Date Taking? Authorizing Provider  butalbital-acetaminophen-caffeine (FIORICET, ESGIC) 50-325-40 MG tablet Take 1 tablet by mouth every 6 (six) hours as needed for  headache. Patient not taking: Reported on 03/05/2018 05/26/17 05/26/18  Willy Eddy, MD  cyclobenzaprine (FLEXERIL) 10 MG tablet Take 1 tablet (10 mg total) by mouth 3 (three) times daily as needed. Patient not taking: Reported on 03/05/2018 08/12/17   Joni Reining, PA-C  ibuprofen (ADVIL,MOTRIN) 200 MG tablet Take 200 mg by mouth every 6 (six) hours as needed for moderate pain.     [provider]  meloxicam (MOBIC) 15 MG tablet Take 1 tablet (15 mg total) by mouth daily. Patient not taking: Reported on 03/05/2018 08/12/17   Joni Reining, PA-C  metroNIDAZOLE (FLAGYL) 500 MG tablet Take 1 tablet (500 mg total) by mouth 3 (three) times daily. Patient not taking: Reported on 03/05/2018 01/25/18   Emily Filbert, MD  Multiple Vitamin (MULTIVITAMIN) tablet Take 1 tablet by mouth daily.    [provider]  naproxen (NAPROSYN) 500 MG tablet Take 1 tablet (500 mg total) by mouth 2 (two) times daily with a meal. Patient not taking: Reported on 03/05/2018 05/26/17 05/26/18  Willy Eddy, MD  oxyCODONE-acetaminophen (PERCOCET) 5-325 MG tablet Take 1 tablet by mouth every 8 (eight) hours as needed. 01/25/18   Emily Filbert, MD  senna-docusate (SENOKOT-S) 8.6-50 MG tablet Take 2 tablets by mouth at bedtime. Patient not taking: Reported on 03/05/2018 01/25/18 01/25/19  Emily Filbert, MD  traMADol (ULTRAM) 50 MG tablet Take 1 tablet (50 mg total) by mouth every 6 (six) hours as needed. Patient not taking: Reported on 03/05/2018 08/12/17 08/12/18  Katrinka Blazing,  Arther Abbott, PA-C    Allergies Patient has no known allergies.  Family History  Problem Relation Age of Onset  . Heart disease Mother        Massive Heart Attack    Social History Social History   Tobacco Use  . Smoking status: Never Smoker  . Smokeless tobacco: Never Used  Substance Use Topics  . Alcohol use: Yes  . Drug use: Not on file    Review of Systems Constitutional: No fever/chills Eyes: No visual  changes. ENT: No sore throat. Cardiovascular: Denies chest pain. Respiratory: Denies shortness of breath. Gastrointestinal: No abdominal pain.   Genitourinary: Negative for dysuria. Musculoskeletal: Negative for back pain. Skin: Negative for rash. Neurological: Negative for headaches, areas of focal weakness or numbness.  ____________________________________________   PHYSICAL EXAM:  VITAL SIGNS: ED Triage Vitals  Enc Vitals Group     BP 04/15/18 0704 (!) 138/96     Pulse Rate 04/15/18 0704 84     Resp 04/15/18 0704 20     Temp 04/15/18 0704 98 F (36.7 C)     Temp Source 04/15/18 0704 Oral     SpO2 04/15/18 0704 100 %     Weight 04/15/18 0702 290 lb (131.5 kg)     Height 04/15/18 0702 5\' 8"  (1.727 m)     Head Circumference --      Peak Flow --      Pain Score 04/15/18 0702 7     Pain Loc --      Pain Edu? --      Excl. in GC? --     Constitutional: Alert and oriented. Well appearing and in no acute distress. Eyes: Conjunctivae are normal. Head: Atraumatic. Nose: No congestion/rhinnorhea. Mouth/Throat: Mucous membranes are moist. Neck: No stridor.  Cardiovascular: Normal rate, regular rhythm. Grossly normal heart sounds.  Good peripheral circulation. Respiratory: Normal respiratory effort.  No retractions. Lungs CTAB. Gastrointestinal: Soft and nontender for some mild discomfort at the superior aspect of a rather large ventral hernia.  There is no overlying erythema and the majority of the area is nontender soft. No distention. Musculoskeletal: No lower extremity tenderness nor edema. Neurologic:  Normal speech and language. No gross focal neurologic deficits are appreciated.  Skin:  Skin is warm, dry and intact. No rash noted. Psychiatric: Mood and affect are normal. Speech and behavior are normal.  ____________________________________________   LABS (all labs ordered are listed, but only abnormal results are displayed)  Labs Reviewed  CBC - Abnormal; Notable  for the following components:      Result Value   WBC 13.7 (*)    All other components within normal limits  URINALYSIS, COMPLETE (UACMP) WITH MICROSCOPIC - Abnormal; Notable for the following components:   Hgb urine dipstick SMALL (*)    Leukocytes, UA SMALL (*)    Bacteria, UA RARE (*)    All other components within normal limits  COMPREHENSIVE METABOLIC PANEL - Abnormal; Notable for the following components:   Glucose, Bld 100 (*)    All other components within normal limits  PREGNANCY, URINE - Abnormal; Notable for the following components:   Preg Test, Ur POSITIVE (*)    All other components within normal limits  HCG, QUANTITATIVE, PREGNANCY - Abnormal; Notable for the following components:   hCG, Beta Chain, Quant, S 768 (*)    All other components within normal limits  URINE CULTURE  TROPONIN I  LIPASE, BLOOD   ____________________________________________  EKG  ED ECG REPORT I, Loraine Leriche  Quale, the attending physician, personally viewed and interpreted this ECG.  Date: 04/15/2018 EKG Time: 707 Rate: 80 Rhythm: normal sinus rhythm QRS Axis: normal Intervals: normal ST/T Wave abnormalities: normal Narrative Interpretation: no evidence of acute ischemia  ____________________________________________  RADIOLOGY   ____________________________________________   PROCEDURES  Procedure(s) performed: None  Procedures  Critical Care performed: No  ____________________________________________   INITIAL IMPRESSION / ASSESSMENT AND PLAN / ED COURSE  Pertinent labs & imaging results that were available during my care of the patient were reviewed by me and considered in my medical decision making (see chart for details).   Patient returns for evaluation of abdominal discomfort and nausea primarily in the epigastric region.  Has a large ventral hernia, that does not appear incarcerated by clinical examination.  Of clinical significance, the patient reports her last  menstrual cycle was about 1 month ago and her pregnancy test was positive today which she was not aware of.  Clinically exists her examination is reassuring without evidence of peritonitis or a suspected ectopic pregnancy.  Ultrasound does not reveal an intrauterine pregnancy, but given her low hCG this is not surprising and clinical examination does not suggest hemodynamic instability or signs or symptoms suggest an obvious ectopic.  Discussed case with Dr. Jean Rosenthal of OB/GYN, he will have the patient follow-up closely with his clinic for a repeat quant either tomorrow or on Monday.  Additionally, patient seen by general surgery regarding her very large ventral hernia (Dr. Maia Plan) who saw her in the emergency department and advised that he would continue to recommend outpatient follow-up for her ventral hernia, did not have concerns regarding an acute abdomen or evidence of problem involving the hernia at this time.     Return precautions and treatment recommendations and follow-up discussed with the patient who is agreeable with the plan.  Patient feeling quite a bit improved, feels well.  Comfortable plan for discharge and close follow-up with OB/GYN.  Patient reports symptoms much better after receiving medication. ____________________________________________   FINAL CLINICAL IMPRESSION(S) / ED DIAGNOSES  Final diagnoses:  Ventral hernia without obstruction or gangrene  Positive pregnancy test        Note:  This document was prepared using Dragon voice recognition software and may include unintentional dictation errors       Sharyn Creamer, MD 04/15/18 1213

## 2018-04-15 NOTE — Discharge Instructions (Signed)
Get help right away if: You have a fever. You are leaking fluid from your vagina. You have spotting or bleeding from your vagina. You have very bad belly cramping or pain. You gain or lose weight rapidly. You throw up blood. It may look like coffee grounds. You are around people who have [place] measles, fifth disease, or chickenpox. You have a very bad headache. You have shortness of breath. You have any kind of trauma, such as from a fall or a car accident.   Follow-up closely with OB/GYN, call today for an appointment for an emergency room visit follow-up.

## 2018-04-16 ENCOUNTER — Encounter: Payer: Self-pay | Admitting: Obstetrics and Gynecology

## 2018-04-16 ENCOUNTER — Ambulatory Visit (INDEPENDENT_AMBULATORY_CARE_PROVIDER_SITE_OTHER): Payer: Medicaid Other | Admitting: Obstetrics and Gynecology

## 2018-04-16 VITALS — BP 128/88 | Ht 68.0 in | Wt 291.0 lb

## 2018-04-16 DIAGNOSIS — O3680X0 Pregnancy with inconclusive fetal viability, not applicable or unspecified: Secondary | ICD-10-CM

## 2018-04-16 LAB — URINE CULTURE: Special Requests: NORMAL

## 2018-04-16 NOTE — Progress Notes (Signed)
Patient ID: [name], female   DOB: 09/17/1985, 31 y.o.   MRN: [id number]  Reason for Consult: Follow-up   Referred by [facility]  Subjective:     HPI:  Donna Navarro is a 32 y.o. female she is following up from a ER visit were she incidentally was found to be pregnant. She is feeling well. She has had some nausea. She denies pelvic pain. She has not had vaginal bleeding. She denies vomiting. She does not know her exact LM, but reports her last period was at the end of September. She says that she has somewhat regular cycles.   OB History  Gravida Para Term Preterm AB Living  6 3 2 1 2 3   SAB TAB Ectopic Multiple Live Births  2       2    # Outcome Date GA Lbr Len/2nd Weight Sex Delivery Anes PTL Lv  6 Current           5 SAB 07/25/17          4 Term 12/14/09 [redacted]w[redacted]d   F Vag-Spont   LIV     Complications: Cervical cerclage suture present  3 Term 05/16/05 [redacted]w[redacted]d   F Vag-Spont   LIV     Complications: Cervical cerclage suture present  2 SAB 2003          1 Preterm     U Vag-Spont  Y FD    Obstetric Comments  History of cerclages with term pregnancies     Past Medical History:  Diagnosis Date  . Hypertension   . Irregular periods   . Morbid obesity (HCC) 04/30/2017  . Ventral hernia without obstruction or gangrene 04/30/2017   Family History  Problem Relation Age of Onset  . Heart disease Mother        Massive Heart Attack   Past Surgical History:  Procedure Laterality Date  . CHOLECYSTECTOMY      Short Social History:  Social History   Tobacco Use  . Smoking status: Never Smoker  . Smokeless tobacco: Never Used  Substance Use Topics  . Alcohol use: Yes    No Known Allergies  Current Outpatient Medications  Medication Sig Dispense Refill  . butalbital-acetaminophen-caffeine (FIORICET, ESGIC) 50-325-40 MG tablet Take 1 tablet by mouth every 6 (six) hours as needed for headache. (Patient not taking: Reported on 03/05/2018) 4 tablet 0    . cyclobenzaprine (FLEXERIL) 10 MG tablet Take 1 tablet (10 mg total) by mouth 3 (three) times daily as needed. (Patient not taking: Reported on 03/05/2018) 15 tablet 0  . ibuprofen (ADVIL,MOTRIN) 200 MG tablet Take 200 mg by mouth every 6 (six) hours as needed for moderate pain.     . meloxicam (MOBIC) 15 MG tablet Take 1 tablet (15 mg total) by mouth daily. (Patient not taking: Reported on 03/05/2018) 30 tablet 2  . metroNIDAZOLE (FLAGYL) 500 MG tablet Take 1 tablet (500 mg total) by mouth 3 (three) times daily. (Patient not taking: Reported on 03/05/2018) 30 tablet 0  . Multiple Vitamin (MULTIVITAMIN) tablet Take 1 tablet by mouth daily.    . naproxen (NAPROSYN) 500 MG tablet Take 1 tablet (500 mg total) by mouth 2 (two) times daily with a meal. (Patient not taking: Reported on 03/05/2018) 20 tablet 0  . oxyCODONE-acetaminophen (PERCOCET) 5-325 MG tablet Take 1 tablet by mouth every 8 (eight) hours as needed. (Patient not taking: Reported on 04/16/2018) 20 tablet 0  . senna-docusate (SENOKOT-S) 8.6-50 MG  tablet Take 2 tablets by mouth at bedtime. (Patient not taking: Reported on 03/05/2018) 30 tablet 1  . traMADol (ULTRAM) 50 MG tablet Take 1 tablet (50 mg total) by mouth every 6 (six) hours as needed. (Patient not taking: Reported on 03/05/2018) 20 tablet 0   No current facility-administered medications for this visit.     Review of Systems  Constitutional: Negative for chills, fatigue, fever and unexpected weight change.  HENT: Negative for trouble swallowing.  Eyes: Negative for loss of vision.  Respiratory: Negative for cough, shortness of breath and wheezing.  Cardiovascular: Negative for chest pain, leg swelling, palpitations and syncope.  GI: Negative for abdominal pain, blood in stool, diarrhea, nausea and vomiting.  GU: Negative for difficulty urinating, dysuria, frequency and hematuria.  Musculoskeletal: Negative for back pain, leg pain and joint pain.  Skin: Negative for rash.   Neurological: Negative for dizziness, headaches, light-headedness, numbness and seizures.  Psychiatric: Negative for behavioral problem, confusion, depressed mood and sleep disturbance.        Objective:  Objective   Vitals:   04/16/18 1528  BP: 128/88  Weight: 291 lb (132 kg)  Height: 5\' 8"  (1.727 m)   Body mass index is 44.25 kg/m.  Physical Exam  Constitutional: She is oriented to person, place, and time. She appears well-developed and well-nourished.  HENT:  Head: Normocephalic and atraumatic.  Eyes: Pupils are equal, round, and reactive to light. EOM are normal.  Cardiovascular: Normal rate and regular rhythm.  Pulmonary/Chest: Effort normal. No respiratory distress.  Genitourinary:  Genitourinary Comments: No CMT tenderness, no uterine tenderness, no adnexal tenderness.   Neurological: She is alert and oriented to person, place, and time.  Skin: Skin is warm and dry.  Psychiatric: She has a normal mood and affect. Her behavior is normal. Judgment and thought content normal.  Nursing note and vitals reviewed.      Assessment/Plan:    32 yo  [id number] here for follow up from the ER on 10/.24/19. She was seen there for abdominal pain. She incidentally discovered that she is pregnant. She thinks that she may be newly pregnant. She reports that her last menstrual period was sometime around the end of September. Ultrasound did not show evidence of IUP or ectopic pregnancy. Will obtain  beta hcg tomorrow at outpatient lab in hospital. Instructed the patient to have the lab drawn at least 48 hours after her labs were drawn in the hospital. Will  Plan follow up based on these results. Discussed ectopic risks and precautions.   Return in 2 weeks for an [place] and GYN follow up   More than 30 minutes were spent face to face with the patient in the room with more than 50% of the time spent providing counseling and discussing the plan of management.    Adelene Idler MD [facility] 04/16/18 7:50 PM

## 2018-04-17 ENCOUNTER — Other Ambulatory Visit
Admission: RE | Admit: 2018-04-17 | Discharge: 2018-04-17 | Disposition: A | Discharge status: Home / Self Care | Payer: Medicaid Other | Source: Ambulatory Visit | Attending: Obstetrics and Gynecology | Admitting: Obstetrics and Gynecology

## 2018-04-17 DIAGNOSIS — O3680X0 Pregnancy with inconclusive fetal viability, not applicable or unspecified: Secondary | ICD-10-CM | POA: Insufficient documentation

## 2018-04-20 LAB — BETA HCG QUANT (REF LAB): hCG Quant: 1700 m[IU]/mL

## 2018-04-20 NOTE — Progress Notes (Signed)
Normal increase, discussed on phone, she will follow up on the 8th for an ultrasound.

## 2018-04-20 NOTE — Progress Notes (Signed)
Please make this patient a proof of pregnancy letter.  Thank you,  Dr. Jerene Pitch

## 2018-04-20 NOTE — Progress Notes (Signed)
Note has been put up front for pick up

## 2018-04-30 ENCOUNTER — Other Ambulatory Visit: Payer: Self-pay | Admitting: Obstetrics and Gynecology

## 2018-04-30 ENCOUNTER — Ambulatory Visit (INDEPENDENT_AMBULATORY_CARE_PROVIDER_SITE_OTHER): Payer: Medicaid Other | Admitting: Obstetrics and Gynecology

## 2018-04-30 ENCOUNTER — Encounter: Payer: Self-pay | Admitting: Obstetrics and Gynecology

## 2018-04-30 ENCOUNTER — Ambulatory Visit (INDEPENDENT_AMBULATORY_CARE_PROVIDER_SITE_OTHER): Payer: Self-pay

## 2018-04-30 DIAGNOSIS — O3680X0 Pregnancy with inconclusive fetal viability, not applicable or unspecified: Secondary | ICD-10-CM

## 2018-05-13 ENCOUNTER — Other Ambulatory Visit: Payer: Self-pay

## 2018-05-13 ENCOUNTER — Encounter: Payer: Self-pay | Admitting: Emergency Medicine

## 2018-05-13 ENCOUNTER — Telehealth: Payer: Self-pay

## 2018-05-13 ENCOUNTER — Emergency Department: Payer: Medicaid Other

## 2018-05-13 DIAGNOSIS — O4691 Antepartum hemorrhage, unspecified, first trimester: Secondary | ICD-10-CM | POA: Insufficient documentation

## 2018-05-13 DIAGNOSIS — O9989 Other specified diseases and conditions complicating pregnancy, childbirth and the puerperium: Secondary | ICD-10-CM | POA: Diagnosis not present

## 2018-05-13 DIAGNOSIS — R42 Dizziness and giddiness: Secondary | ICD-10-CM | POA: Diagnosis not present

## 2018-05-13 DIAGNOSIS — Z3A01 Less than 8 weeks gestation of pregnancy: Secondary | ICD-10-CM | POA: Insufficient documentation

## 2018-05-13 DIAGNOSIS — O26811 Pregnancy related exhaustion and fatigue, first trimester: Secondary | ICD-10-CM | POA: Insufficient documentation

## 2018-05-13 DIAGNOSIS — O10011 Pre-existing essential hypertension complicating pregnancy, first trimester: Secondary | ICD-10-CM | POA: Insufficient documentation

## 2018-05-13 DIAGNOSIS — R102 Pelvic and perineal pain: Secondary | ICD-10-CM | POA: Insufficient documentation

## 2018-05-13 DIAGNOSIS — Z9049 Acquired absence of other specified parts of digestive tract: Secondary | ICD-10-CM | POA: Diagnosis not present

## 2018-05-13 DIAGNOSIS — O219 Vomiting of pregnancy, unspecified: Secondary | ICD-10-CM | POA: Diagnosis present

## 2018-05-13 LAB — URINALYSIS, COMPLETE (UACMP) WITH MICROSCOPIC
Bilirubin Urine: NEGATIVE
Glucose, UA: NEGATIVE mg/dL
Hgb urine dipstick: NEGATIVE
Ketones, ur: NEGATIVE mg/dL
Leukocytes, UA: NEGATIVE
Nitrite: NEGATIVE
PROTEIN: NEGATIVE mg/dL
Specific Gravity, Urine: 1.03 (ref 1.005–1.030)
pH: 5 (ref 5.0–8.0)

## 2018-05-13 LAB — COMPREHENSIVE METABOLIC PANEL
ALBUMIN: 3.7 g/dL (ref 3.5–5.0)
ALT: 27 U/L (ref 0–44)
AST: 18 U/L (ref 15–41)
Alkaline Phosphatase: 69 U/L (ref 38–126)
Anion gap: 6 (ref 5–15)
BILIRUBIN TOTAL: 0.5 mg/dL (ref 0.3–1.2)
BUN: 13 mg/dL (ref 6–20)
CO2: 26 mmol/L (ref 22–32)
Calcium: 9.1 mg/dL (ref 8.9–10.3)
Chloride: 103 mmol/L (ref 98–111)
Creatinine, Ser: 0.74 mg/dL (ref 0.44–1.00)
GFR calc Af Amer: >60 mL/min (ref >60)
GFR calc non Af Amer: >60 mL/min (ref >60)
GLUCOSE: 101 mg/dL — AB (ref 70–99)
POTASSIUM: 3.4 mmol/L — AB (ref 3.5–5.1)
SODIUM: 135 mmol/L (ref 135–145)
Total Protein: 7.6 g/dL (ref 6.5–8.1)

## 2018-05-13 LAB — LIPASE, BLOOD: Lipase: 30 U/L (ref 11–51)

## 2018-05-13 LAB — CBC
HEMATOCRIT: 38.4 % (ref 36.0–46.0)
HEMOGLOBIN: 12.7 g/dL (ref 12.0–15.0)
MCH: 28.6 pg (ref 26.0–34.0)
MCHC: 33.1 g/dL (ref 30.0–36.0)
MCV: 86.5 fL (ref 80.0–100.0)
Platelets: 346 10*3/uL (ref 150–400)
RBC: 4.44 MIL/uL (ref 3.87–5.11)
RDW: 14.6 % (ref 11.5–15.5)
WBC: 17.9 10*3/uL — ABNORMAL HIGH (ref 4.0–10.5)
nRBC: 0 % (ref 0.0–0.2)

## 2018-05-13 LAB — TROPONIN I: Troponin I: <0.03 ng/mL (ref <0.03)

## 2018-05-13 LAB — HCG, QUANTITATIVE, PREGNANCY: HCG, BETA CHAIN, QUANT, S: 71952 m[IU]/mL — AB (ref <5)

## 2018-05-13 NOTE — ED Triage Notes (Signed)
Patient reports that she is eight weeks pregnant and for the last 3 days has been experiencing dizziness and lower abdominal cramping. Patient reports dizziness is worse in the morning. Denies any spotting.

## 2018-05-13 NOTE — Telephone Encounter (Signed)
Pt is calling triage line stating she is [redacted] weeks pregnant. This is day 3 she has been out of work d/t dizziness and back pain w/pelvic cramping. She would like to know what she can do?   [facility]

## 2018-05-14 ENCOUNTER — Emergency Department
Admission: EM | Admit: 2018-05-14 | Discharge: 2018-05-14 | Disposition: A | Discharge status: Home / Self Care | Payer: Medicaid Other | Attending: Emergency Medicine | Admitting: Emergency Medicine

## 2018-05-14 DIAGNOSIS — O418X10 Other specified disorders of amniotic fluid and membranes, first trimester, not applicable or unspecified: Secondary | ICD-10-CM

## 2018-05-14 DIAGNOSIS — R42 Dizziness and giddiness: Secondary | ICD-10-CM

## 2018-05-14 DIAGNOSIS — O468X1 Other antepartum hemorrhage, first trimester: Secondary | ICD-10-CM

## 2018-05-14 MED ORDER — SODIUM CHLORIDE 0.9 % IV BOLUS
1000.0000 mL | Freq: Once | INTRAVENOUS | Status: AC
  Administered 2018-05-14: 1000 mL via INTRAVENOUS

## 2018-05-14 MED ORDER — ONDANSETRON 4 MG PO TBDP: 4.0000 mg | ORAL_TABLET | Freq: Three times a day (TID) | ORAL | 0 refills | Status: DC | PRN

## 2018-05-14 NOTE — Telephone Encounter (Signed)
Pt was told to rest but if felt comfortable to go to ED, she went to the ED and should be seeing us within the next week

## 2018-05-14 NOTE — Discharge Instructions (Addendum)
1.  Drink plenty of fluids daily. 2.  You may take Zofran as needed for nausea/vomiting. 3.  Return to the ER for worsening symptoms, persistent vomiting, difficulty breathing or other concerns.

## 2018-05-14 NOTE — ED Provider Notes (Signed)
[facility] Emergency Department Provider Note   ____________________________________________   First MD Initiated Contact with Patient 05/14/18 [phone]     (approximate)  I have reviewed the triage vital signs and the nursing notes.   HISTORY  Chief Complaint Dizziness and Abdominal Pain    HPI Donna Navarro is a 32 y.o. female G6, P2 AB 3 approximately [redacted] weeks pregnant who presents to the ED from home with a chief complaint of nausea/vomiting, dizziness and pelvic cramping.  Patient reports dizziness only in the morning.  Denies fever, chills, headache, vision changes, chest pain, shortness of breath, abdominal pain, dysuria, diarrhea.  Going to [facility].  She was seen at the end of October in the ED for large ventral hernia.  Has not had any problems with it since.  Denies recent travel or trauma.  Denies vaginal discharge or bleeding.    Past Medical History:  Diagnosis Date  . Hypertension   . Irregular periods   . Morbid obesity (HCC) 04/30/2017  . Ventral hernia without obstruction or gangrene 04/30/2017    Patient Active Problem List   Diagnosis Date Noted  . 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 Ventral hernia without obstruction or gangrene 04/30/2017  . Morbid obesity (HCC) 04/30/2017    Past Surgical History:  Procedure Laterality Date  . CHOLECYSTECTOMY      Prior to Admission medications   Medication Sig Start Date End Date Taking? Authorizing Provider  butalbital-acetaminophen-caffeine (FIORICET, ESGIC) 50-325-40 MG tablet Take 1 tablet by mouth every 6 (six) hours as needed for headache. Patient not taking: Reported on 03/05/2018 05/26/17 05/26/18  Willy Eddy, MD  cyclobenzaprine (FLEXERIL) 10 MG tablet Take 1 tablet (10 mg total) by mouth 3 (three) times daily as needed. Patient not taking: Reported on 03/05/2018 08/12/17   Joni Reining, PA-C  metroNIDAZOLE (FLAGYL) 500 MG tablet Take 1 tablet (500 mg total) by mouth 3  (three) times daily. Patient not taking: Reported on 03/05/2018 01/25/18   Emily Filbert, MD  Multiple Vitamin (MULTIVITAMIN) tablet Take 1 tablet by mouth daily.    [provider]  oxyCODONE-acetaminophen (PERCOCET) 5-325 MG tablet Take 1 tablet by mouth every 8 (eight) hours as needed. Patient not taking: Reported on 04/16/2018 01/25/18   Emily Filbert, MD  senna-docusate (SENOKOT-S) 8.6-50 MG tablet Take 2 tablets by mouth at bedtime. Patient not taking: Reported on 03/05/2018 01/25/18 01/25/19  Emily Filbert, MD    Allergies Patient has no known allergies.  Family History  Problem Relation Age of Onset  . Heart disease Mother        Massive Heart Attack    Social History Social History   Tobacco Use  . Smoking status: Never Smoker  . Smokeless tobacco: Never Used  Substance Use Topics  . Alcohol use: Yes  . Drug use: Not on file    Review of Systems  Constitutional: No fever/chills Eyes: No visual changes. ENT: No sore throat. Cardiovascular: Denies chest pain. Respiratory: Denies shortness of breath. Gastrointestinal: No abdominal pain.  Positive for nausea and vomiting.  No diarrhea.  No constipation. Genitourinary: Negative for dysuria. Musculoskeletal: Negative for back pain. Skin: Negative for rash. Neurological: Positive for dizziness.  Negative for headaches, focal weakness or numbness.   ____________________________________________   PHYSICAL EXAM:  VITAL SIGNS: ED Triage Vitals [05/13/18 1850]  Enc Vitals Group     BP (!) 158/86     Pulse Rate 87     Resp 16  Temp 98.4 F (36.9 C)     Temp Source Oral     SpO2      Weight 288 lb 12.8 oz (131 kg)     Height 5\' 8"  (1.727 m)     Head Circumference      Peak Flow      Pain Score 7     Pain Loc      Pain Edu?      Excl. in GC?     Constitutional: Asleep, awakened for exam.  Alert and oriented. Well appearing and in no acute distress. Eyes: Conjunctivae are normal.  PERRL. EOMI. Head: Atraumatic. Nose: No congestion/rhinnorhea. Mouth/Throat: Mucous membranes are moist.  Oropharynx non-erythematous. Neck: No stridor.  No carotid bruits.  Supple neck without meningismus. Cardiovascular: Normal rate, regular rhythm. Grossly normal heart sounds.  Good peripheral circulation. Respiratory: Normal respiratory effort.  No retractions. Lungs CTAB. Gastrointestinal: Soft and nontender to light or deep palpation. No distention. No abdominal bruits. No CVA tenderness. Musculoskeletal: No lower extremity tenderness nor edema.  No joint effusions. Neurologic: Alert and oriented x3.  CN II-XII grossly intact.  Normal speech and language. No gross focal neurologic deficits are appreciated. No gait instability. Skin:  Skin is warm, dry and intact. No rash noted. Psychiatric: Mood and affect are normal. Speech and behavior are normal.  ____________________________________________   LABS (all labs ordered are listed, but only abnormal results are displayed)  Labs Reviewed  COMPREHENSIVE METABOLIC PANEL - Abnormal; Notable for the following components:      Result Value   Potassium 3.4 (*)    Glucose, Bld 101 (*)    All other components within normal limits  CBC - Abnormal; Notable for the following components:   WBC 17.9 (*)    All other components within normal limits  URINALYSIS, COMPLETE (UACMP) WITH MICROSCOPIC - Abnormal; Notable for the following components:   Color, Urine YELLOW (*)    APPearance HAZY (*)    Bacteria, UA RARE (*)    All other components within normal limits  HCG, QUANTITATIVE, PREGNANCY - Abnormal; Notable for the following components:   hCG, Beta Chain, Quant, S [id number] (*)    All other components within normal limits  LIPASE, BLOOD  TROPONIN I   ____________________________________________  EKG  ED ECG REPORT I, Skylier Kretschmer J, the attending physician, personally viewed and interpreted this ECG.   Date: 05/14/2018  EKG Time:  1854  Rate: 86  Rhythm: normal EKG, normal sinus rhythm  Axis: Normal  Intervals:none  ST&T Change: Nonspecific  ____________________________________________  RADIOLOGY  ED MD interpretation: IUP [redacted]w[redacted]d, small subchorionic hemorrhage  Official radiology report(s): US Ob Comp Less 14 Wks  Result Date: 05/13/2018 CLINICAL DATA:  Pregnant patient with abdominal pain for 3 days. EXAM: OBSTETRIC <14 WK [place] AND TRANSVAGINAL OB US TECHNIQUE: Both transabdominal and transvaginal ultrasound examinations were performed for complete evaluation of the gestation as well as the maternal uterus, adnexal regions, and pelvic cul-de-sac. Transvaginal technique was performed to assess early pregnancy. COMPARISON:  None. FINDINGS: Intrauterine gestational sac: Single Yolk sac:  Visualized. Embryo:  Visualized. Cardiac Activity: Visualized. Heart Rate: 171 bpm CRL:  16.5 mm   8 w   1 d                  [place] EDC: 12/22/2018 Subchorionic hemorrhage:  Small Maternal uterus/adnexae: Normal right and left ovaries. No free fluid in the pelvis. IMPRESSION: Single live intrauterine gestation.  Small subchorionic hemorrhage. Electronically Signed  By: Annia Beltrew  Davis M.D.   On: 05/13/2018 20:51   [place] Ob Transvaginal  Result Date: 05/13/2018 CLINICAL DATA:  Pregnant patient with abdominal pain for 3 days. EXAM: OBSTETRIC <14 WK US AND TRANSVAGINAL OB US TECHNIQUE: Both transabdominal and transvaginal ultrasound examinations were performed for complete evaluation of the gestation as well as the maternal uterus, adnexal regions, and pelvic cul-de-sac. Transvaginal technique was performed to assess early pregnancy. COMPARISON:  None. FINDINGS: Intrauterine gestational sac: Single Yolk sac:  Visualized. Embryo:  Visualized. Cardiac Activity: Visualized. Heart Rate: 171 bpm CRL:  16.5 mm   8 w   1 d                  US EDC: 12/22/2018 Subchorionic hemorrhage:  Small Maternal uterus/adnexae: Normal right and left ovaries. No free fluid in  the pelvis. IMPRESSION: Single live intrauterine gestation.  Small subchorionic hemorrhage. Electronically Signed   By: Annia Beltrew  Davis M.D.   On: 05/13/2018 20:51    ____________________________________________   PROCEDURES  Procedure(s) performed: None  Procedures  Critical Care performed: No  ____________________________________________   INITIAL IMPRESSION / ASSESSMENT AND PLAN / ED COURSE  As part of my medical decision making, I reviewed the following data within the electronic MEDICAL RECORD NUMBER Nursing notes reviewed and incorporated, Labs reviewed, EKG interpreted, Old chart reviewed, Radiograph reviewed and Notes from prior ED visits   32 year old female approximately [redacted] weeks pregnant who presents with dizziness only in the mornings, and nausea/vomiting.  No vaginal bleeding.  Differential diagnosis includes but is not limited to dehydration, orthostasis, hyperemesis gravidarum, etc.  Laboratory results remarkable for leukocytosis most likely secondary to pregnancy.  Troponin and EKG are negative.  Patient was mildly orthostatic.  Will infuse IV fluids.  Will discharge home with antiemetic.  Will reassess.  Clinical Course as of May 14 357  Fri May 14, 2018  [id number] Patient sleeping no acute distress.  Feeling better.  Will discharge home with prescription for ODT Zofran to use as needed.  She will follow-up with her OB/GYN next week.  Strict return precautions given.  Patient verbalizes understanding and agrees with plan of care.   [JS]    Clinical Course User Index [JS] Irean HongSung, Morna Flud J, MD     ____________________________________________   FINAL CLINICAL IMPRESSION(S) / ED DIAGNOSES  Final diagnoses:  Dizziness  Subchorionic hematoma in first trimester, single or unspecified fetus     ED Discharge Orders    None       Note:  This document was prepared using Dragon voice recognition software and may include unintentional dictation errors.    Irean HongSung, Danaysia Rader J,  MD 05/14/18 [phone]

## 2018-05-17 ENCOUNTER — Other Ambulatory Visit (HOSPITAL_COMMUNITY)
Admission: RE | Admit: 2018-05-17 | Discharge: 2018-05-17 | Disposition: A | Discharge status: Home / Self Care | Payer: Medicaid Other | Source: Ambulatory Visit | Attending: Obstetrics and Gynecology | Admitting: Obstetrics and Gynecology

## 2018-05-17 ENCOUNTER — Ambulatory Visit (INDEPENDENT_AMBULATORY_CARE_PROVIDER_SITE_OTHER): Payer: Medicaid Other | Admitting: Obstetrics and Gynecology

## 2018-05-17 ENCOUNTER — Encounter: Payer: Self-pay | Admitting: Obstetrics and Gynecology

## 2018-05-17 VITALS — BP 124/76 | Wt 288.0 lb

## 2018-05-17 DIAGNOSIS — O0991 Supervision of high risk pregnancy, unspecified, first trimester: Secondary | ICD-10-CM | POA: Insufficient documentation

## 2018-05-17 DIAGNOSIS — N883 Incompetence of cervix uteri: Secondary | ICD-10-CM

## 2018-05-17 DIAGNOSIS — Z9889 Other specified postprocedural states: Secondary | ICD-10-CM

## 2018-05-17 DIAGNOSIS — O26891 Other specified pregnancy related conditions, first trimester: Secondary | ICD-10-CM

## 2018-05-17 DIAGNOSIS — O09291 Supervision of pregnancy with other poor reproductive or obstetric history, first trimester: Secondary | ICD-10-CM

## 2018-05-17 DIAGNOSIS — O0990 Supervision of high risk pregnancy, unspecified, unspecified trimester: Secondary | ICD-10-CM | POA: Insufficient documentation

## 2018-05-17 DIAGNOSIS — O09299 Supervision of pregnancy with other poor reproductive or obstetric history, unspecified trimester: Secondary | ICD-10-CM

## 2018-05-17 DIAGNOSIS — Z3A08 8 weeks gestation of pregnancy: Secondary | ICD-10-CM

## 2018-05-17 NOTE — Progress Notes (Signed)
05/17/2018   Chief Complaint: Missed period  Transfer of Care Patient: no  History of Present Illness: [name] is a 32 y.o. [id number] [redacted]w[redacted]d based on Patient's last menstrual period was 03/16/2018 (approximate). with an Estimated Date of Delivery: 12/24/18, with the above CC.   Her periods were: irregular periods She was using no method when she conceived.  She has Positive signs or symptoms of nausea/vomiting of pregnancy. She has Negative signs or symptoms of miscarriage or preterm labor She identifies Negative Zika risk factors for her and her partner On any different medications around the time she conceived/early pregnancy: No  History of varicella: Yes   ROS: A 12-point review of systems was performed and negative, except as stated in the above HPI.  OBGYN History: As per HPI. OB History  Gravida Para Term Preterm AB Living  6 3 2 1 2 3   SAB TAB Ectopic Multiple Live Births  2       2    # Outcome Date GA Lbr Len/2nd Weight Sex Delivery Anes PTL Lv  6 Current           5 SAB 07/25/17          4 Term 12/14/09 [redacted]w[redacted]d   F Vag-Spont   LIV     Complications: Cervical cerclage suture present  3 Term 05/16/05 [redacted]w[redacted]d   F Vag-Spont   LIV     Complications: Cervical cerclage suture present  2 SAB 2003          1 Preterm     U Vag-Spont  Y FD    Obstetric Comments  History of cerclages with term pregnancies    Any issues with any prior pregnancies: She had a cerclage with two prior pregnancies Any prior children are healthy, doing well, without any problems or issues: yes History of pap smears: No History of STIs: Yes   Past Medical History: Past Medical History:  Diagnosis Date  . Hypertension   . Irregular periods   . Morbid obesity (HCC) 04/30/2017  . Ventral hernia without obstruction or gangrene 04/30/2017    Past Surgical History: Past Surgical History:  Procedure Laterality Date  . CHOLECYSTECTOMY      Family History:  Family History  Problem Relation Age of  Onset  . Heart disease Mother        Massive Heart Attack   She denies any female cancers, bleeding or blood clotting disorders.  She denies any history of mental retardation, birth defects or genetic disorders in her or the FOB's history  Social History:  Social History   Socioeconomic History  . Marital status: Single    Spouse name: Not on file  . Number of children: Not on file  . Years of education: Not on file  . Highest education level: Not on file  Occupational History  . Not on file  Social Needs  . Financial resource strain: Not on file  . Food insecurity:    Worry: Not on file    Inability: Not on file  . Transportation needs:    Medical: Not on file    Non-medical: Not on file  Tobacco Use  . Smoking status: Never Smoker  . Smokeless tobacco: Never Used  Substance and Sexual Activity  . Alcohol use: Yes  . Drug use: Not on file  . Sexual activity: Yes    Birth control/protection: None  Lifestyle  . Physical activity:    Days per week: Not on file  Minutes per session: Not on file  . Stress: Not on file  Relationships  . Social connections:    Talks on phone: Not on file    Gets together: Not on file    Attends religious service: Not on file    Active member of club or organization: Not on file    Attends meetings of clubs or organizations: Not on file    Relationship status: Not on file  . Intimate partner violence:    Fear of current or ex partner: Not on file    Emotionally abused: Not on file    Physically abused: Not on file    Forced sexual activity: Not on file  Other Topics Concern  . Not on file  Social History Narrative  . Not on file    Allergy: No Known Allergies  Current Outpatient Medications:  Current Outpatient Medications:  Marland Kitchen  Multiple Vitamin (MULTIVITAMIN) tablet, Take 1 tablet by mouth daily., Disp: , Rfl:  .  butalbital-acetaminophen-caffeine (FIORICET, ESGIC) 50-325-40 MG tablet, Take 1 tablet by mouth every 6 (six)  hours as needed for headache. (Patient not taking: Reported on 03/05/2018), Disp: 4 tablet, Rfl: 0 .  cyclobenzaprine (FLEXERIL) 10 MG tablet, Take 1 tablet (10 mg total) by mouth 3 (three) times daily as needed. (Patient not taking: Reported on 03/05/2018), Disp: 15 tablet, Rfl: 0 .  metroNIDAZOLE (FLAGYL) 500 MG tablet, Take 1 tablet (500 mg total) by mouth 3 (three) times daily. (Patient not taking: Reported on 03/05/2018), Disp: 30 tablet, Rfl: 0 .  ondansetron (ZOFRAN ODT) 4 MG disintegrating tablet, Take 1 tablet (4 mg total) by mouth every 8 (eight) hours as needed for nausea or vomiting. (Patient not taking: Reported on 05/17/2018), Disp: 20 tablet, Rfl: 0 .  oxyCODONE-acetaminophen (PERCOCET) 5-325 MG tablet, Take 1 tablet by mouth every 8 (eight) hours as needed. (Patient not taking: Reported on 04/16/2018), Disp: 20 tablet, Rfl: 0 .  senna-docusate (SENOKOT-S) 8.6-50 MG tablet, Take 2 tablets by mouth at bedtime. (Patient not taking: Reported on 03/05/2018), Disp: 30 tablet, Rfl: 1   Physical Exam:   BP 124/76   Wt 288 lb (130.6 kg)   LMP 03/16/2018 (Approximate)   BMI 43.79 kg/m  Body mass index is 43.79 kg/m. Constitutional: Well nourished, well developed female in no acute distress.  Neck:  Supple, normal appearance, and no thyromegaly  Cardiovascular: S1, S2 normal, no murmur, rub or gallop, regular rate and rhythm Respiratory:  Clear to auscultation bilateral. Normal respiratory effort Abdomen: positive bowel sounds and no masses, hernias; diffusely non tender to palpation, non distended Breasts:  patient declines to have breast exam. Neuro/Psych:  Normal mood and affect.  Skin:  Warm and dry.  Lymphatic:  No inguinal lymphadenopathy.   Pelvic exam: is limited by body habitus EGBUS: within normal limits, Vagina: within normal limits and with no blood in the vault, Cervix: normal appearing cervix without discharge or lesions, closed/long/high, Uterus:  nonenlarged, and Adnexa:   normal adnexa  Assessment: [name] is a 32 y.o. [id number] [redacted]w[redacted]d based on Patient's last menstrual period was 03/16/2018 (approximate). with an Estimated Date of Delivery: 12/24/18,  for prenatal care.  Plan:  1) Avoid alcoholic beverages. 2) Patient encouraged not to smoke.  3) Discontinue the use of all non-medicinal drugs and chemicals.  4) Take prenatal vitamins daily.  5) Seatbelt use advised 6) Nutrition, food safety (fish, cheese advisories, and high nitrite foods) and exercise discussed. 7) Hospital and practice style delivering at [facility] discussed  8) Patient is asked about travel to areas at risk for the Zika virus, and counseled to avoid travel and exposure to mosquitoes or sexual partners who may have themselves been exposed to the virus. Testing is discussed, and will be ordered as appropriate.  9) [facility] advised 10) Genetic Screening, such as with 1st Trimester Screening, cell free fetal DNA, AFP testing, and Ultrasound, as well as with amniocentesis and CVS as appropriate, is discussed with patient. She plans to have genetic testing this pregnancy.  Discussed working while pregnant B6 over the counter for nausea- patient can not afford right now. When medicaid is active can prescribe diclegis.  Discussed Ginger, small frequent meals and BRAT diet Hx of cerclage- will schedule for 12 week appointment Labs can not be done today, insurance not active- ordered as future orders. Discussed starting ASA at 12 weeks Problem list reviewed and updated. Discussed weight gain goals for pregnancy of 0-15lbs total. Patient recently dropped from 350 lbs.   Adelene Idlerhristanna Zulma Court MD [facility], [facility] 05/17/18 2:19 PM

## 2018-05-17 NOTE — Progress Notes (Signed)
NOB C/o Nausea and dizziness  Declines flu shot

## 2018-05-18 ENCOUNTER — Telehealth: Payer: Self-pay | Admitting: Obstetrics and Gynecology

## 2018-05-18 NOTE — Telephone Encounter (Signed)
[facility] 3:50pm, Pre-admit Testing phone interview to be scheduled, and OR on 06/17/18. Pregnancy Medicaid is active on [employer]. Confirmed patient's next appointment on 05/31/18 @ 1:30pm.

## 2018-05-18 NOTE — Telephone Encounter (Signed)
-----   Message from Natale Milchhristanna R Schuman, MD sent at 05/17/2018  2:19 PM EST ----- Surgery Booking Request Patient Full Name:  [name]  MRN: [id number]  DOB: 05-May-1986  Surgeon: Natale Milchhristanna R Schuman, MD  Requested Surgery Date and Time: 4 weeks from now Primary Diagnosis AND Code: hx of cervical incompetence Secondary Diagnosis and Code:  Surgical Procedure: cervical cerclage L&D Notification: No Admission Status: same day surgery Length of Surgery: 1 hour Special Case Needs: none H&P:   (date) Phone Interview???: yes Interpreter: Language:  Medical Clearance: no Special Scheduling Instructions: do after [redacted] weeks gestation

## 2018-05-19 LAB — CERVICOVAGINAL ANCILLARY ONLY
Chlamydia: NEGATIVE
Neisseria Gonorrhea: NEGATIVE

## 2018-05-19 LAB — CYTOLOGY - PAP
DIAGNOSIS: NEGATIVE
HPV: NOT DETECTED

## 2018-05-23 ENCOUNTER — Emergency Department
Admission: EM | Admit: 2018-05-23 | Discharge: 2018-05-23 | Disposition: A | Discharge status: Home / Self Care | Payer: Medicaid Other | Attending: Emergency Medicine | Admitting: Emergency Medicine

## 2018-05-23 ENCOUNTER — Encounter: Payer: Self-pay | Admitting: Emergency Medicine

## 2018-05-23 ENCOUNTER — Other Ambulatory Visit: Payer: Self-pay

## 2018-05-23 ENCOUNTER — Emergency Department: Payer: Medicaid Other

## 2018-05-23 DIAGNOSIS — O208 Other hemorrhage in early pregnancy: Secondary | ICD-10-CM | POA: Insufficient documentation

## 2018-05-23 DIAGNOSIS — Z3A09 9 weeks gestation of pregnancy: Secondary | ICD-10-CM | POA: Insufficient documentation

## 2018-05-23 DIAGNOSIS — R1030 Lower abdominal pain, unspecified: Secondary | ICD-10-CM | POA: Insufficient documentation

## 2018-05-23 DIAGNOSIS — O10011 Pre-existing essential hypertension complicating pregnancy, first trimester: Secondary | ICD-10-CM | POA: Diagnosis not present

## 2018-05-23 DIAGNOSIS — O4690 Antepartum hemorrhage, unspecified, unspecified trimester: Secondary | ICD-10-CM

## 2018-05-23 DIAGNOSIS — Z79899 Other long term (current) drug therapy: Secondary | ICD-10-CM | POA: Insufficient documentation

## 2018-05-23 LAB — CBC WITH DIFFERENTIAL/PLATELET
ABS IMMATURE GRANULOCYTES: 0.09 10*3/uL — AB (ref 0.00–0.07)
BASOS PCT: 0 %
Basophils Absolute: 0.1 10*3/uL (ref 0.0–0.1)
EOS ABS: 0.1 10*3/uL (ref 0.0–0.5)
Eosinophils Relative: 0 %
HCT: 37 % (ref 36.0–46.0)
Hemoglobin: 12.1 g/dL (ref 12.0–15.0)
IMMATURE GRANULOCYTES: 1 %
Lymphocytes Relative: 18 %
Lymphs Abs: 3.1 10*3/uL (ref 0.7–4.0)
MCH: 28.6 pg (ref 26.0–34.0)
MCHC: 32.7 g/dL (ref 30.0–36.0)
MCV: 87.5 fL (ref 80.0–100.0)
MONO ABS: 0.8 10*3/uL (ref 0.1–1.0)
MONOS PCT: 5 %
NEUTROS ABS: 12.9 10*3/uL — AB (ref 1.7–7.7)
NEUTROS PCT: 76 %
Platelets: 325 10*3/uL (ref 150–400)
RBC: 4.23 MIL/uL (ref 3.87–5.11)
RDW: 14.6 % (ref 11.5–15.5)
WBC: 17 10*3/uL — ABNORMAL HIGH (ref 4.0–10.5)
nRBC: 0 % (ref 0.0–0.2)

## 2018-05-23 LAB — ABO/RH: ABO/RH(D): A POS

## 2018-05-23 LAB — HCG, QUANTITATIVE, PREGNANCY: HCG, BETA CHAIN, QUANT, S: 60586 m[IU]/mL — AB (ref <5)

## 2018-05-23 NOTE — ED Notes (Signed)
Patient transported to Ultrasound 

## 2018-05-23 NOTE — ED Provider Notes (Signed)
[facility] Emergency Department Provider Note   ____________________________________________    I have reviewed the triage vital signs and the nursing notes.   HISTORY  Chief Complaint Vaginal Bleeding     HPI Donna Navarro is a 32 y.o. female who presents with complaints of vaginal spotting.  Patient reports intermittent lower abdominal cramping which is been, during this pregnancy.  Saw her OB 1 week ago, has had ultrasound demonstrating IUP.  She reports 3 days of spotting with some worsening cramping today.  No nausea or vomiting today.  Called her OB but unable to get in touch with him so came to the ED to get checked out ".   Past Medical History:  Diagnosis Date  . Hypertension   . Irregular periods   . Morbid obesity (HCC) 04/30/2017  . Ventral hernia without obstruction or gangrene 04/30/2017    Patient Active Problem List   Diagnosis Date Noted  . Pregnancy, supervision, high-risk, first trimester 05/17/2018  . Cervical incompetence 05/17/2018  . Hx of cerclage, currently pregnant 05/17/2018  . 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 Ventral hernia without obstruction or gangrene 04/30/2017  . Morbid obesity (HCC) 04/30/2017    Past Surgical History:  Procedure Laterality Date  . CHOLECYSTECTOMY      Prior to Admission medications   Medication Sig Start Date End Date Taking? Authorizing Provider  prenatal vitamin w/FE, FA (NATACHEW) 29-1 MG CHEW chewable tablet Chew 1 tablet by mouth daily at 12 noon.   Yes [provider]  butalbital-acetaminophen-caffeine (FIORICET, ESGIC) 50-325-40 MG tablet Take 1 tablet by mouth every 6 (six) hours as needed for headache. Patient not taking: Reported on 03/05/2018 05/26/17 05/26/18  Willy Eddyobinson, Patrick, MD  cyclobenzaprine (FLEXERIL) 10 MG tablet Take 1 tablet (10 mg total) by mouth 3 (three) times daily as needed. Patient not taking: Reported on 03/05/2018 08/12/17   Joni ReiningSmith, Ronald K, PA-C    metroNIDAZOLE (FLAGYL) 500 MG tablet Take 1 tablet (500 mg total) by mouth 3 (three) times daily. Patient not taking: Reported on 03/05/2018 01/25/18   Emily FilbertWilliams, Jonathan E, MD  ondansetron (ZOFRAN ODT) 4 MG disintegrating tablet Take 1 tablet (4 mg total) by mouth every 8 (eight) hours as needed for nausea or vomiting. Patient not taking: Reported on 05/23/2018 05/14/18   Irean HongSung, Jade J, MD  oxyCODONE-acetaminophen (PERCOCET) 5-325 MG tablet Take 1 tablet by mouth every 8 (eight) hours as needed. Patient not taking: Reported on 04/16/2018 01/25/18   Emily FilbertWilliams, Jonathan E, MD  senna-docusate (SENOKOT-S) 8.6-50 MG tablet Take 2 tablets by mouth at bedtime. Patient not taking: Reported on 03/05/2018 01/25/18 01/25/19  Emily FilbertWilliams, Jonathan E, MD     Allergies Patient has no known allergies.  Family History  Problem Relation Age of Onset  . Heart disease Mother        Massive Heart Attack    Social History Social History   Tobacco Use  . Smoking status: Never Smoker  . Smokeless tobacco: Never Used  Substance Use Topics  . Alcohol use: Yes  . Drug use: Not on file    Review of Systems    Cardiovascular: Denies chest pain. Respiratory: Denies shortness of breath. Gastrointestinal: As above Genitourinary: Negative for dysuria.  Spotting as above Musculoskeletal: Negative for back pain. Skin: Negative for rash. Neurological: Negative for headaches   ____________________________________________   PHYSICAL EXAM:  VITAL SIGNS: ED Triage Vitals  Enc Vitals Group     BP 05/23/18 1044 (!) 149/75  Pulse Rate 05/23/18 1044 85     Resp 05/23/18 1044 16     Temp 05/23/18 1044 98.2 F (36.8 C)     Temp src --      SpO2 05/23/18 1044 100 %     Weight 05/23/18 1045 130.6 kg (288 lb 0.1 oz)     Height 05/23/18 1045 1.727 m (5\' 8" )     Head Circumference --      Peak Flow --      Pain Score 05/23/18 1045 4     Pain Loc --      Pain Edu? --      Excl. in GC? --     Constitutional:  Alert and oriented.  Eyes: Conjunctivae are normal.   Nose: No congestion/rhinnorhea. Mouth/Throat: Mucous membranes are moist.    Cardiovascular: Normal rate, regular rhythm.  Good peripheral circulation. Respiratory: Normal respiratory effort.  No retractions.  Gastrointestinal: Soft and nontender. No distention.  Large ventral hernia, no tenderness to palpation  Musculoskeletal: Warm and well perfused Neurologic:  Normal speech and language. No gross focal neurologic deficits are appreciated.  Skin:  Skin is warm, dry and intact. No rash noted. Psychiatric: Mood and affect are normal. Speech and behavior are normal.  ____________________________________________   LABS (all labs ordered are listed, but only abnormal results are displayed)  Labs Reviewed  HCG, QUANTITATIVE, PREGNANCY - Abnormal; Notable for the following components:      Result Value   hCG, Beta Chain, Quant, S 60,586 (*)    All other components within normal limits  CBC WITH DIFFERENTIAL/PLATELET - Abnormal; Notable for the following components:   WBC 17.0 (*)    Neutro Abs 12.9 (*)    Abs Immature Granulocytes 0.09 (*)    All other components within normal limits  ABO/RH  ABO/RH   ____________________________________________  EKG  None ____________________________________________  RADIOLOGY  Ultrasound pending ____________________________________________   PROCEDURES  Procedure(s) performed: No  Procedures   Critical Care performed: No ____________________________________________   INITIAL IMPRESSION / ASSESSMENT AND PLAN / ED COURSE  Pertinent labs & imaging results that were available during my care of the patient were reviewed by me and considered in my medical decision making (see chart for details).  Patient presents with vaginal spotting and mild cramping.  Does have a history of miscarriage and stillborn, last 2 pregnancies had cerclage.  Overall well-appearing with unremarkable  exam, pending ultrasound  Ultrasound is reassuring.  Discussed with Dr. Gaynelle Arabian who would like to see the patient in her office later this week    ____________________________________________   FINAL CLINICAL IMPRESSION(S) / ED DIAGNOSES  Final diagnoses:  Vaginal bleeding in pregnancy        Note:  This document was prepared using Dragon voice recognition software and may include unintentional dictation errors.    Jene Every, MD 05/23/18 [phone]

## 2018-05-23 NOTE — ED Triage Notes (Signed)
PT states she is [redacted] weeks pregnant and has noticed spotting since yesterday am. Pt reports mild cramping yesterday. Pt states with her hx of miscarriage she wanted to be evaluated, pt contacted her OBGYN but states they have not contacted her back.

## 2018-05-25 ENCOUNTER — Telehealth: Payer: Self-pay

## 2018-05-25 NOTE — Telephone Encounter (Signed)
Pt has time limit for putting in for PTO time for her cerclage on 12/26th.  What to do?  [phone]  Left detailed that we could get her a note ready and she could either p/u at her convenience or at her next appt.

## 2018-05-31 ENCOUNTER — Encounter: Payer: Self-pay | Admitting: Maternal Newborn

## 2018-05-31 ENCOUNTER — Ambulatory Visit (INDEPENDENT_AMBULATORY_CARE_PROVIDER_SITE_OTHER): Payer: Medicaid Other | Admitting: Maternal Newborn

## 2018-05-31 VITALS — BP 120/80 | Wt 290.0 lb

## 2018-05-31 DIAGNOSIS — Z1379 Encounter for other screening for genetic and chromosomal anomalies: Secondary | ICD-10-CM

## 2018-05-31 DIAGNOSIS — O0991 Supervision of high risk pregnancy, unspecified, first trimester: Secondary | ICD-10-CM

## 2018-05-31 DIAGNOSIS — Z3A10 10 weeks gestation of pregnancy: Secondary | ICD-10-CM

## 2018-05-31 DIAGNOSIS — O99211 Obesity complicating pregnancy, first trimester: Secondary | ICD-10-CM

## 2018-05-31 LAB — POCT URINALYSIS DIPSTICK OB
Glucose, UA: NEGATIVE
POC,PROTEIN,UA: NEGATIVE

## 2018-05-31 NOTE — Progress Notes (Signed)
    Routine Prenatal Care Visit  Subjective  Donna Navarro is a 32 y.o. [id number] at 5564w3d being seen today for ongoing prenatal care.  She is currently monitored for the following issues for this high-risk pregnancy and has Ventral hernia without obstruction or gangrene; Morbid obesity (HCC); Oligomenorrhea; Pregnancy, supervision, high-risk, first trimester; Cervical incompetence; and Hx of cerclage, currently pregnant on their problem list.  ----------------------------------------------------------------------------------- Patient reports one episode with a small amount of brownish bleeding last Thursday.   Contractions: Not present. Vag. Bleeding: None.  ----------------------------------------------------------------------------------- The following portions of the patient's history were reviewed and updated as appropriate: allergies, current medications, past family history, past medical history, past social history, past surgical history and problem list. Problem list updated.  Objective  Blood pressure 120/80, weight 290 lb (131.5 kg), last menstrual period 03/16/2018. Pregravid weight Pregravid weight not on file Total Weight Gain Not found. Urinalysis: Protein Negative, Glucose Negative   General:  Alert, oriented and cooperative. Patient is in no acute distress.  Skin: Skin is warm and dry. No rash noted.   Cardiovascular: Normal heart rate noted  Respiratory: Normal respiratory effort, no problems with respiration noted  Abdomen: Soft, gravid, appropriate for gestational age.       Pelvic:  Cervical exam deferred        Extremities: Normal range of motion.     Mental Status: Normal mood and affect. Normal behavior. Normal judgment and thought content.    Assessment   32 y.o. [id number] at 4164w3d, EDD 12/24/2018 by Ultrasound presenting for a routine prenatal visit.  Plan   Pregnancy #6 Problems (from 05/17/18 to present)    Problem Noted Resolved   Pregnancy, supervision,  high-risk, first trimester 05/17/2018 by Natale MilchSchuman, Christanna R, MD No   Overview Signed 05/17/2018  2:26 PM by Natale MilchSchuman, Christanna R, MD      [facility] Prenatal Labs  Dating  T1 US Blood type:     Genetic Screen NIPS:   desired Antibody:   Anatomic US  Rubella:   Varicella: @VZVIGG @  GTT Early:        28 wk:      RPR:     Rhogam  HBsAg:     TDaP vaccine                       HIV:     Flu Shot                                GBS:   Contraception  BTL- [ ]  consents Pap:  CBB     CS/VBAC    Baby Food  bottle   Support Person   significant other           Cervical incompetence 05/17/2018 by Natale MilchSchuman, Christanna R, MD No   Hx of cerclage, currently pregnant 05/17/2018 by Natale MilchSchuman, Christanna R, MD No   Overview Signed 05/17/2018  2:25 PM by Natale MilchSchuman, Christanna R, MD    - cerclage after [redacted] weeks gestation [ ]         MaterniTi 21 screening today.  Scheduled for cerclage on 12/26.  Please refer to After Visit Summary for other counseling recommendations.   Return in about 4 weeks (around 06/28/2018) for ROB.  Donna Navarro, CNM 05/31/2018  1:45 PM

## 2018-05-31 NOTE — Patient Instructions (Signed)
First Trimester of Pregnancy The first trimester of pregnancy is from week 1 until the end of week 13 (months 1 through 3). A week after a sperm fertilizes an egg, the egg will implant on the wall of the uterus. This embryo will begin to develop into a baby. Genes from you and your partner will form the baby. The female genes will determine whether the baby will be a boy or a girl. At 6-8 weeks, the eyes and face will be formed, and the heartbeat can be seen on ultrasound. At the end of 12 weeks, all the baby's organs will be formed. Now that you are pregnant, you will want to do everything you can to have a healthy baby. Two of the most important things are to get good prenatal care and to follow your health care provider's instructions. Prenatal care is all the medical care you receive before the baby's birth. This care will help prevent, find, and treat any problems during the pregnancy and childbirth. Body changes during your first trimester Your body goes through many changes during pregnancy. The changes vary from woman to woman.  You may gain or lose a couple of pounds at first.  You may feel sick to your stomach (nauseous) and you may throw up (vomit). If the vomiting is uncontrollable, call your health care provider.  You may tire easily.  You may develop headaches that can be relieved by medicines. All medicines should be approved by your health care provider.  You may urinate more often. Painful urination may mean you have a bladder infection.  You may develop heartburn as a result of your pregnancy.  You may develop constipation because certain hormones are causing the muscles that push stool through your intestines to slow down.  You may develop hemorrhoids or swollen veins (varicose veins).  Your breasts may begin to grow larger and become tender. Your nipples may stick out more, and the tissue that surrounds them (areola) may become darker.  Your gums may bleed and may be  sensitive to brushing and flossing.  Dark spots or blotches (chloasma, mask of pregnancy) may develop on your face. This will likely fade after the baby is born.  Your menstrual periods will stop.  You may have a loss of appetite.  You may develop cravings for certain kinds of food.  You may have changes in your emotions from day to day, such as being excited to be pregnant or being concerned that something may go wrong with the pregnancy and baby.  You may have more vivid and strange dreams.  You may have changes in your hair. These can include thickening of your hair, rapid growth, and changes in texture. Some women also have hair loss during or after pregnancy, or hair that feels dry or thin. Your hair will most likely return to normal after your baby is born.  What to expect at prenatal visits During a routine prenatal visit:  You will be weighed to make sure you and the baby are growing normally.  Your blood pressure will be taken.  Your abdomen will be measured to track your baby's growth.  The fetal heartbeat will be listened to between weeks 10 and 14 of your pregnancy.  Test results from any previous visits will be discussed.  Your health care provider may ask you:  How you are feeling.  If you are feeling the baby move.  If you have had any abnormal symptoms, such as leaking fluid, bleeding, severe headaches,   or abdominal cramping.  If you are using any tobacco products, including cigarettes, chewing tobacco, and electronic cigarettes.  If you have any questions.  Other tests that may be performed during your first trimester include:  Blood tests to find your blood type and to check for the presence of any previous infections. The tests will also be used to check for low iron levels (anemia) and protein on red blood cells (Rh antibodies). Depending on your risk factors, or if you previously had diabetes during pregnancy, you may have tests to check for high blood  sugar that affects pregnant women (gestational diabetes).  Urine tests to check for infections, diabetes, or protein in the urine.  An ultrasound to confirm the proper growth and development of the baby.  Fetal screens for spinal cord problems (spina bifida) and Down syndrome.  HIV (human immunodeficiency virus) testing. Routine prenatal testing includes screening for HIV, unless you choose not to have this test.  You may need other tests to make sure you and the baby are doing well.  Follow these instructions at home: Medicines  Follow your health care provider's instructions regarding medicine use. Specific medicines may be either safe or unsafe to take during pregnancy.  Take a prenatal vitamin that contains at least 600 micrograms (mcg) of folic acid.  If you develop constipation, try taking a stool softener if your health care provider approves. Eating and drinking  Eat a balanced diet that includes fresh fruits and vegetables, whole grains, good sources of protein such as meat, eggs, or tofu, and low-fat dairy. Your health care provider will help you determine the amount of weight gain that is right for you.  Avoid raw meat and uncooked cheese. These carry germs that can cause birth defects in the baby.  Eating four or five small meals rather than three large meals a day may help relieve nausea and vomiting. If you start to feel nauseous, eating a few soda crackers can be helpful. Drinking liquids between meals, instead of during meals, also seems to help ease nausea and vomiting.  Limit foods that are high in fat and processed sugars, such as fried and sweet foods.  To prevent constipation: ? Eat foods that are high in fiber, such as fresh fruits and vegetables, whole grains, and beans. ? Drink enough fluid to keep your urine clear or pale yellow. Activity  Exercise only as directed by your health care provider. Most women can continue their usual exercise routine during  pregnancy. Try to exercise for 30 minutes at least 5 days a week. Exercising will help you: ? Control your weight. ? Stay in shape. ? Be prepared for labor and delivery.  Experiencing pain or cramping in the lower abdomen or lower back is a good sign that you should stop exercising. Check with your health care provider before continuing with normal exercises.  Try to avoid standing for long periods of time. Move your legs often if you must stand in one place for a long time.  Avoid heavy lifting.  Wear low-heeled shoes and practice good posture.  You may continue to have sex unless your health care provider tells you not to. Relieving pain and discomfort  Wear a good support bra to relieve breast tenderness.  Take warm sitz baths to soothe any pain or discomfort caused by hemorrhoids. Use hemorrhoid cream if your health care provider approves.  Rest with your legs elevated if you have leg cramps or low back pain.  If you develop   varicose veins in your legs, wear support hose. Elevate your feet for 15 minutes, 3-4 times a day. Limit salt in your diet. Prenatal care  Schedule your prenatal visits by the twelfth week of pregnancy. They are usually scheduled monthly at first, then more often in the last 2 months before delivery.  Write down your questions. Take them to your prenatal visits.  Keep all your prenatal visits as told by your health care provider. This is important. Safety  Wear your seat belt at all times when driving.  Make a list of emergency phone numbers, including numbers for family, friends, the hospital, and police and fire departments. General instructions  Ask your health care provider for a referral to a local prenatal education class. Begin classes no later than the beginning of month 6 of your pregnancy.  Ask for help if you have counseling or nutritional needs during pregnancy. Your health care provider can offer advice or refer you to specialists for help  with various needs.  Do not use hot tubs, steam rooms, or saunas.  Do not douche or use tampons or scented sanitary pads.  Do not cross your legs for long periods of time.  Avoid cat litter boxes and soil used by cats. These carry germs that can cause birth defects in the baby and possibly loss of the fetus by miscarriage or stillbirth.  Avoid all smoking, herbs, alcohol, and medicines not prescribed by your health care provider. Chemicals in these products affect the formation and growth of the baby.  Do not use any products that contain nicotine or tobacco, such as cigarettes and e-cigarettes. If you need help quitting, ask your health care provider. You may receive counseling support and other resources to help you quit.  Schedule a dentist appointment. At home, brush your teeth with a soft toothbrush and be gentle when you floss. Contact a health care provider if:  You have dizziness.  You have mild pelvic cramps, pelvic pressure, or nagging pain in the abdominal area.  You have persistent nausea, vomiting, or diarrhea.  You have a bad smelling vaginal discharge.  You have pain when you urinate.  You notice increased swelling in your face, hands, legs, or ankles.  You are exposed to fifth disease or chickenpox.  You are exposed to German measles (rubella) and have never had it. Get help right away if:  You have a fever.  You are leaking fluid from your vagina.  You have spotting or bleeding from your vagina.  You have severe abdominal cramping or pain.  You have rapid weight gain or loss.  You vomit blood or material that looks like coffee grounds.  You develop a severe headache.  You have shortness of breath.  You have any kind of trauma, such as from a fall or a car accident. Summary  The first trimester of pregnancy is from week 1 until the end of week 13 (months 1 through 3).  Your body goes through many changes during pregnancy. The changes vary from  woman to woman.  You will have routine prenatal visits. During those visits, your health care provider will examine you, discuss any test results you may have, and talk with you about how you are feeling. This information is not intended to replace advice given to you by your health care provider. Make sure you discuss any questions you have with your health care provider. Document Released: 06/03/2001 Document Revised: 05/21/2016 Document Reviewed: 05/21/2016 Elsevier Interactive Patient Education  2018 [employer]  

## 2018-06-05 LAB — MATERNIT 21 PLUS CORE, BLOOD
CHROMOSOME 13: NEGATIVE
Chromosome 18: NEGATIVE
Chromosome 21: NEGATIVE
Y Chromosome: NOT DETECTED

## 2018-06-07 ENCOUNTER — Telehealth: Payer: Self-pay

## 2018-06-07 NOTE — Telephone Encounter (Signed)
Pt calling for test results from 12/9.  [phone] Pt aware MaterniT21 is neg and gender is female.

## 2018-06-11 ENCOUNTER — Ambulatory Visit (INDEPENDENT_AMBULATORY_CARE_PROVIDER_SITE_OTHER): Payer: Medicaid Other | Admitting: Obstetrics and Gynecology

## 2018-06-11 VITALS — BP 122/80 | HR 87 | Ht 68.0 in | Wt 292.0 lb

## 2018-06-11 DIAGNOSIS — N883 Incompetence of cervix uteri: Secondary | ICD-10-CM

## 2018-06-11 DIAGNOSIS — B373 Candidiasis of vulva and vagina: Secondary | ICD-10-CM

## 2018-06-11 DIAGNOSIS — Z9889 Other specified postprocedural states: Secondary | ICD-10-CM

## 2018-06-11 DIAGNOSIS — O09299 Supervision of pregnancy with other poor reproductive or obstetric history, unspecified trimester: Secondary | ICD-10-CM

## 2018-06-11 DIAGNOSIS — O09291 Supervision of pregnancy with other poor reproductive or obstetric history, first trimester: Secondary | ICD-10-CM

## 2018-06-11 DIAGNOSIS — K439 Ventral hernia without obstruction or gangrene: Secondary | ICD-10-CM

## 2018-06-11 DIAGNOSIS — B3731 Acute candidiasis of vulva and vagina: Secondary | ICD-10-CM

## 2018-06-11 DIAGNOSIS — O0991 Supervision of high risk pregnancy, unspecified, first trimester: Secondary | ICD-10-CM

## 2018-06-11 DIAGNOSIS — Z3A12 12 weeks gestation of pregnancy: Secondary | ICD-10-CM

## 2018-06-11 MED ORDER — FLUCONAZOLE 150 MG PO TABS: 150.0000 mg | ORAL_TABLET | ORAL | 0 refills | Status: AC

## 2018-06-11 NOTE — Progress Notes (Signed)
 Patient ID: Donna Navarro, female   DOB: 04/15/1986, 32 y.o.   MRN: [id number]  Reason for Consult: Pre-op Exam   [facility] by [facility]  Subjective:     HPI:  Donna Navarro is a 32 y.o. female She is feeling well. Notes some pelvic pressure.   OB History  Gravida Para Term Preterm AB Living  6 3 2 1 2 3  SAB TAB Ectopic Multiple Live Births  2       2    # Outcome Date GA Lbr Len/2nd Weight Sex Delivery Anes PTL Lv  6 Current           5 SAB 07/25/17          4 Term 12/14/09 [redacted]w[redacted]d   F Vag-Spont   LIV     Complications: Cervical cerclage suture present  3 Term 05/16/05 [redacted]w[redacted]d   F Vag-Spont   LIV     Complications: Cervical cerclage suture present  2 SAB 2003          1 Preterm     U Vag-Spont  Y FD    Obstetric Comments  History of cerclages with term pregnancies     Past Medical History:  Diagnosis Date  . Hypertension   . Irregular periods   . Morbid obesity (HCC) 04/30/2017  . Ventral hernia without obstruction or gangrene 04/30/2017   Family History  Problem Relation Age of Onset  . Heart disease Mother        Massive Heart Attack   Past Surgical History:  Procedure Laterality Date  . CHOLECYSTECTOMY      Short Social History:  Social History   Tobacco Use  . Smoking status: Never Smoker  . Smokeless tobacco: Never Used  Substance Use Topics  . Alcohol use: Yes    No Known Allergies  Current Outpatient Medications  Medication Sig Dispense Refill  . prenatal vitamin w/FE, FA (NATACHEW) 29-1 MG CHEW chewable tablet Chew 1 tablet by mouth daily.     . fluconazole (DIFLUCAN) 150 MG tablet Take 1 tablet (150 mg total) by mouth every 3 (three) days for 2 doses. 2 tablet 0   No current facility-administered medications for this visit.     Review of Systems  Constitutional: Negative for chills, fatigue, fever and unexpected weight change.  HENT: Negative for trouble swallowing.  Eyes: Negative for loss of vision.  Respiratory:  Negative for cough, shortness of breath and wheezing.  Cardiovascular: Negative for chest pain, leg swelling, palpitations and syncope.  GI: Negative for abdominal pain, blood in stool, diarrhea, nausea and vomiting.  GU: Negative for difficulty urinating, dysuria, frequency and hematuria.  Musculoskeletal: Negative for back pain, leg pain and joint pain.  Skin: Negative for rash.  Neurological: Negative for dizziness, headaches, light-headedness, numbness and seizures.  Psychiatric: Negative for behavioral problem, confusion, depressed mood and sleep disturbance.        Objective:  Objective   Vitals:   06/11/18 1620  BP: 122/80  Pulse: 87  Weight: 292 lb (132.5 kg)  Height: 5' 8" (1.727 m)   Body mass index is 44.4 kg/m.  Physical Exam Vitals signs and nursing note reviewed.  Constitutional:      Appearance: She is well-developed.  HENT:     Head: Normocephalic and atraumatic.  Eyes:     Pupils: Pupils are equal, round, and reactive to light.  Cardiovascular:     Rate and Rhythm: Normal rate and regular rhythm.    Pulmonary:     Effort: Pulmonary effort is normal. No respiratory distress.  Skin:    General: Skin is warm and dry.  Neurological:     Mental Status: She is alert and oriented to person, place, and time.  Psychiatric:        Behavior: Behavior normal.        Thought Content: Thought content normal.        Judgment: Judgment normal.    FHR: 160s by US, images given to patient.      Assessment/Plan:     32 yo with a history of cervical incompetence with 2 prior cerclages.  Will proceed with cervical cerclage for history.  Risks benefits and complications of cerclage discussed in detail. Risks included loss of pregnancy, infection, injury to the cervix, etc. Cerclage planned for next week.  New OB labs today.  Prescription sent for yeast vaginitis.   Adelene Idlerhristanna Schuman MD [facility], [facility] 06/11/2018 5:42 PM

## 2018-06-11 NOTE — H&P (View-Only) (Signed)
Patient ID: [name], female   DOB: Feb 21, 1986, 32 y.o.   MRN: [id number]  Reason for Consult: Pre-op Exam   [facility]  Subjective:     HPI:  Unknown Donna N Salzman is a 32 y.o. female She is feeling well. Notes some pelvic pressure.   OB History  Gravida Para Term Preterm AB Living  6 3 2 1 2 3   SAB TAB Ectopic Multiple Live Births  2       2    # Outcome Date GA Lbr Len/2nd Weight Sex Delivery Anes PTL Lv  6 Current           5 SAB 07/25/17          4 Term 12/14/09 [id number]   F Vag-Spont   LIV     Complications: Cervical cerclage suture present  3 Term 05/16/05 [id number]   F Vag-Spont   LIV     Complications: Cervical cerclage suture present  2 SAB 2003          1 Preterm     U Vag-Spont  Y FD    Obstetric Comments  History of cerclages with term pregnancies     Past Medical History:  Diagnosis Date  . Hypertension   . Irregular periods   . Morbid obesity (HCC) 04/30/2017  . Ventral hernia without obstruction or gangrene 04/30/2017   Family History  Problem Relation Age of Onset  . Heart disease Mother        Massive Heart Attack   Past Surgical History:  Procedure Laterality Date  . CHOLECYSTECTOMY      Short Social History:  Social History   Tobacco Use  . Smoking status: Never Smoker  . Smokeless tobacco: Never Used  Substance Use Topics  . Alcohol use: Yes    No Known Allergies  Current Outpatient Medications  Medication Sig Dispense Refill  . prenatal vitamin w/FE, FA (NATACHEW) 29-1 MG CHEW chewable tablet Chew 1 tablet by mouth daily.     . fluconazole (DIFLUCAN) 150 MG tablet Take 1 tablet (150 mg total) by mouth every 3 (three) days for 2 doses. 2 tablet 0   No current facility-administered medications for this visit.     Review of Systems  Constitutional: Negative for chills, fatigue, fever and unexpected weight change.  HENT: Negative for trouble swallowing.  Eyes: Negative for loss of vision.  Respiratory:  Negative for cough, shortness of breath and wheezing.  Cardiovascular: Negative for chest pain, leg swelling, palpitations and syncope.  GI: Negative for abdominal pain, blood in stool, diarrhea, nausea and vomiting.  GU: Negative for difficulty urinating, dysuria, frequency and hematuria.  Musculoskeletal: Negative for back pain, leg pain and joint pain.  Skin: Negative for rash.  Neurological: Negative for dizziness, headaches, light-headedness, numbness and seizures.  Psychiatric: Negative for behavioral problem, confusion, depressed mood and sleep disturbance.        Objective:  Objective   Vitals:   06/11/18 1620  BP: 122/80  Pulse: 87  Weight: 292 lb (132.5 kg)  Height: 5\' 8"  (1.727 m)   Body mass index is 44.4 kg/m.  Physical Exam Vitals signs and nursing note reviewed.  Constitutional:      Appearance: She is well-developed.  HENT:     Head: Normocephalic and atraumatic.  Eyes:     Pupils: Pupils are equal, round, and reactive to light.  Cardiovascular:     Rate and Rhythm: Normal rate and regular rhythm.  Pulmonary:     Effort: Pulmonary effort is normal. No respiratory distress.  Skin:    General: Skin is warm and dry.  Neurological:     Mental Status: She is alert and oriented to person, place, and time.  Psychiatric:        Behavior: Behavior normal.        Thought Content: Thought content normal.        Judgment: Judgment normal.    FHR: 160s by US, images given to patient.      Assessment/Plan:     32 yo with a history of cervical incompetence with 2 prior cerclages.  Will proceed with cervical cerclage for history.  Risks benefits and complications of cerclage discussed in detail. Risks included loss of pregnancy, infection, injury to the cervix, etc. Cerclage planned for next week.  New OB labs today.  Prescription sent for yeast vaginitis.   Adelene Idlerhristanna Kellis Mcadam MD [facility], [facility] 06/11/2018 5:42 PM

## 2018-06-14 ENCOUNTER — Encounter
Admission: RE | Admit: 2018-06-14 | Discharge: 2018-06-14 | Disposition: A | Discharge status: Home / Self Care | Payer: Medicaid Other | Source: Ambulatory Visit | Attending: Obstetrics and Gynecology | Admitting: Obstetrics and Gynecology

## 2018-06-14 ENCOUNTER — Other Ambulatory Visit: Payer: Self-pay

## 2018-06-14 DIAGNOSIS — Z01812 Encounter for preprocedural laboratory examination: Secondary | ICD-10-CM | POA: Diagnosis present

## 2018-06-14 HISTORY — DX: Gastro-esophageal reflux disease without esophagitis: K21.9

## 2018-06-14 LAB — CBC
HCT: 37.2 % (ref 36.0–46.0)
Hemoglobin: 12.1 g/dL (ref 12.0–15.0)
MCH: 28.3 pg (ref 26.0–34.0)
MCHC: 32.5 g/dL (ref 30.0–36.0)
MCV: 87.1 fL (ref 80.0–100.0)
Platelets: 306 10*3/uL (ref 150–400)
RBC: 4.27 MIL/uL (ref 3.87–5.11)
RDW: 14.5 % (ref 11.5–15.5)
WBC: 16.2 10*3/uL — ABNORMAL HIGH (ref 4.0–10.5)
nRBC: 0 % (ref 0.0–0.2)

## 2018-06-14 NOTE — Patient Instructions (Addendum)
Your procedure is scheduled on: 06-17-18 THURSDAY Report to Same Day Surgery 2nd floor medical mall [facility](Medical Mall Entrance-take elevator on left to 2nd floor.  Check in with surgery information desk.) To find out your arrival time please call [phone] between 1PM - 3PM on 06-15-18 TUESDAY  Remember: Instructions that are not followed completely may result in serious medical risk, up to and including death, or upon the discretion of your surgeon and anesthesiologist your surgery may need to be rescheduled.    _x___ 1. Do not eat food after midnight the night before your procedure. NO GUM OR CANDY AFTER MIDNIGHT.  You may drink clear liquids up to 2 hours before you are scheduled to arrive at the hospital for your procedure.  Do not drink clear liquids within 2 hours of your scheduled arrival to the hospital.  Clear liquids include  --Water or Apple juice without pulp  --Clear carbohydrate beverage such as ClearFast or Gatorade  --Black Coffee or Clear Tea (No milk, no creamers, do not add anything to the coffee or Tea   ____Ensure clear carbohydrate drink on the way to the hospital for bariatric patients  _X___Ensure clear carbohydrate drink 3 hours before surgery     __x__ 2. No Alcohol for 24 hours before or after surgery.   __x__3. No Smoking or e-cigarettes for 24 prior to surgery.  Do not use any chewable tobacco products for at least 6 hour prior to surgery   ____  4. Bring all medications with you on the day of surgery if instructed.    __x__ 5. Notify your doctor if there is any change in your medical condition     (cold, fever, infections).    x___6. On the morning of surgery brush your teeth with toothpaste and water.  You may rinse your mouth with mouth wash if you wish.  Do not swallow any toothpaste or mouthwash.   Do not wear jewelry, make-up, hairpins, clips or nail polish.  Do not wear lotions, powders, or perfumes. You may wear deodorant.  Do not shave 48 hours  prior to surgery. Men may shave face and neck.  Do not bring valuables to the hospital.    [facility] is not responsible for any belongings or valuables.               Contacts, dentures or bridgework may not be worn into surgery.  Leave your suitcase in the car. After surgery it may be brought to your room.  For patients admitted to the hospital, discharge time is determined by your treatment team.  _  Patients discharged the day of surgery will not be allowed to drive home.  You will need someone to drive you home and stay with you the night of your procedure.    Please read over the following fact sheets that you were given:   [facility] Preparing for Surgery   ____ Take anti-hypertensive listed below, cardiac, seizure, asthma, anti-reflux and psychiatric medicines. These include:  1. NONE  2.  3.  4.  5.  6.  ____Fleets enema or Magnesium Citrate as directed.   ____ Use CHG Soap or sage wipes as directed on instruction sheet   ____ Use inhalers on the day of surgery and bring to hospital day of surgery  ____ Stop Metformin and Janumet 2 days prior to surgery.    ____ Take 1/2 of usual insulin dose the night before surgery and none on the morning surgery.   ____  Follow recommendations from Cardiologist, Pulmonologist or PCP regarding stopping Aspirin, Coumadin, Plavix ,Eliquis, Effient, or Pradaxa, and Pletal.  X____Stop Anti-inflammatories such as Advil, Aleve, Ibuprofen, Motrin, Naproxen, Naprosyn, Goodies powders or aspirin products NOW-OK to take Tylenol    ____ Stop supplements until after surgery.     ____ Bring C-Pap to the hospital.

## 2018-06-15 LAB — RPR+RH+ABO+RUB AB+AB SCR+CB...
Antibody Screen: NEGATIVE
HEMATOCRIT: 35 % (ref 34.0–46.6)
HEMOGLOBIN: 11.7 g/dL (ref 11.1–15.9)
HIV Screen 4th Generation wRfx: NONREACTIVE
Hepatitis B Surface Ag: NEGATIVE
MCH: 28.8 pg (ref 26.6–33.0)
MCHC: 33.4 g/dL (ref 31.5–35.7)
MCV: 86 fL (ref 79–97)
Platelets: 311 10*3/uL (ref 150–450)
RBC: 4.06 x10E6/uL (ref 3.77–5.28)
RDW: 13.7 % (ref 12.3–15.4)
RH TYPE: POSITIVE
RPR Ser Ql: REACTIVE — AB
Rubella Antibodies, IGG: 2.87 index (ref >0.99)
Varicella zoster IgG: 1427 index (ref >165)
WBC: 15.8 10*3/uL — AB (ref 3.4–10.8)

## 2018-06-15 LAB — TYPE AND SCREEN
ABO/RH(D): A POS
Antibody Screen: NEGATIVE
Extend sample reason: UNDETERMINED

## 2018-06-15 LAB — HEMOGLOBIN A1C
ESTIMATED AVERAGE GLUCOSE: 108 mg/dL
Hgb A1c MFr Bld: 5.4 % (ref 4.8–5.6)

## 2018-06-15 LAB — HEMOGLOBINOPATHY EVALUATION
HEMOGLOBIN F QUANTITATION: 0 % (ref 0.0–2.0)
HGB C: 0 %
HGB S: 0 %
HGB VARIANT: 0 %
Hemoglobin A2 Quantitation: 2.1 % (ref 1.8–3.2)
Hgb A: 97.9 % (ref 96.4–98.8)

## 2018-06-15 LAB — RPR, QUANT+TP ABS (REFLEX)
Rapid Plasma Reagin, Quant: 1:4 {titer} — ABNORMAL HIGH
T Pallidum Abs: REACTIVE — AB

## 2018-06-17 ENCOUNTER — Other Ambulatory Visit: Payer: Self-pay

## 2018-06-17 ENCOUNTER — Ambulatory Visit: Payer: Medicaid Other | Admitting: Anesthesiology

## 2018-06-17 ENCOUNTER — Encounter: Payer: Self-pay | Admitting: Anesthesiology

## 2018-06-17 ENCOUNTER — Ambulatory Visit
Admission: RE | Admit: 2018-06-17 | Discharge: 2018-06-17 | Disposition: A | Discharge status: Home / Self Care | Payer: Medicaid Other | Attending: Obstetrics and Gynecology | Admitting: Obstetrics and Gynecology

## 2018-06-17 ENCOUNTER — Encounter
Admission: RE | Disposition: A | Discharge status: Home / Self Care | Payer: Self-pay | Source: Home / Self Care | Attending: Obstetrics and Gynecology

## 2018-06-17 DIAGNOSIS — O10919 Unspecified pre-existing hypertension complicating pregnancy, unspecified trimester: Secondary | ICD-10-CM | POA: Insufficient documentation

## 2018-06-17 DIAGNOSIS — O3430 Maternal care for cervical incompetence, unspecified trimester: Secondary | ICD-10-CM | POA: Insufficient documentation

## 2018-06-17 DIAGNOSIS — O09293 Supervision of pregnancy with other poor reproductive or obstetric history, third trimester: Secondary | ICD-10-CM | POA: Diagnosis not present

## 2018-06-17 DIAGNOSIS — B373 Candidiasis of vulva and vagina: Secondary | ICD-10-CM | POA: Diagnosis not present

## 2018-06-17 DIAGNOSIS — Z3A00 Weeks of gestation of pregnancy not specified: Secondary | ICD-10-CM | POA: Diagnosis not present

## 2018-06-17 DIAGNOSIS — O99210 Obesity complicating pregnancy, unspecified trimester: Secondary | ICD-10-CM | POA: Diagnosis not present

## 2018-06-17 DIAGNOSIS — O98819 Other maternal infectious and parasitic diseases complicating pregnancy, unspecified trimester: Secondary | ICD-10-CM | POA: Insufficient documentation

## 2018-06-17 DIAGNOSIS — Z3A12 12 weeks gestation of pregnancy: Secondary | ICD-10-CM

## 2018-06-17 HISTORY — PX: CERVICAL CERCLAGE: SHX1329

## 2018-06-17 SURGERY — CERCLAGE, CERVIX, VAGINAL APPROACH
Anesthesia: General

## 2018-06-17 MED ORDER — ROCURONIUM BROMIDE 50 MG/5ML IV SOLN
INTRAVENOUS | Status: AC
  Filled 2018-06-17: qty 1

## 2018-06-17 MED ORDER — ROCURONIUM BROMIDE 100 MG/10ML IV SOLN
INTRAVENOUS | Status: DC | PRN
  Administered 2018-06-17: 20 mg via INTRAVENOUS

## 2018-06-17 MED ORDER — FENTANYL CITRATE (PF) 100 MCG/2ML IJ SOLN
INTRAMUSCULAR | Status: DC | PRN
  Administered 2018-06-17: 50 ug via INTRAVENOUS

## 2018-06-17 MED ORDER — ROCURONIUM BROMIDE 100 MG/10ML IV SOLN: INTRAVENOUS | Status: DC | PRN

## 2018-06-17 MED ORDER — MIDAZOLAM HCL 2 MG/2ML IJ SOLN
INTRAMUSCULAR | Status: AC
  Filled 2018-06-17: qty 2

## 2018-06-17 MED ORDER — LIDOCAINE HCL 2 % IJ SOLN
INTRAMUSCULAR | Status: AC
  Filled 2018-06-17: qty 10

## 2018-06-17 MED ORDER — FENTANYL CITRATE (PF) 100 MCG/2ML IJ SOLN
INTRAMUSCULAR | Status: AC
  Filled 2018-06-17: qty 2

## 2018-06-17 MED ORDER — EPHEDRINE SULFATE 50 MG/ML IJ SOLN
INTRAMUSCULAR | Status: DC | PRN
  Administered 2018-06-17: 10 mg via INTRAVENOUS

## 2018-06-17 MED ORDER — PROPOFOL 10 MG/ML IV BOLUS
INTRAVENOUS | Status: DC | PRN
  Administered 2018-06-17: 160 mg via INTRAVENOUS

## 2018-06-17 MED ORDER — PHENYLEPHRINE HCL 10 MG/ML IJ SOLN
INTRAMUSCULAR | Status: DC | PRN
  Administered 2018-06-17 (×3): 100 ug via INTRAVENOUS

## 2018-06-17 MED ORDER — ONDANSETRON HCL 4 MG/2ML IJ SOLN: 4.0000 mg | Freq: Once | INTRAMUSCULAR | Status: DC | PRN

## 2018-06-17 MED ORDER — FAMOTIDINE 20 MG PO TABS
20.0000 mg | ORAL_TABLET | Freq: Once | ORAL | Status: AC
  Administered 2018-06-17: 20 mg via ORAL

## 2018-06-17 MED ORDER — LACTATED RINGERS IV SOLN: INTRAVENOUS | Status: DC

## 2018-06-17 MED ORDER — LACTATED RINGERS IV SOLN
INTRAVENOUS | Status: DC
  Administered 2018-06-17 (×2): via INTRAVENOUS

## 2018-06-17 MED ORDER — SUGAMMADEX SODIUM 200 MG/2ML IV SOLN
INTRAVENOUS | Status: DC | PRN
  Administered 2018-06-17: 265 mg via INTRAVENOUS

## 2018-06-17 MED ORDER — ONDANSETRON HCL 4 MG/2ML IJ SOLN
INTRAMUSCULAR | Status: DC | PRN
  Administered 2018-06-17: 4 mg via INTRAVENOUS

## 2018-06-17 MED ORDER — SODIUM CHLORIDE 0.9 % IV SOLN: INTRAVENOUS | Status: DC | PRN

## 2018-06-17 MED ORDER — FENTANYL CITRATE (PF) 100 MCG/2ML IJ SOLN: 25.0000 ug | INTRAMUSCULAR | Status: DC | PRN

## 2018-06-17 MED ORDER — PROPOFOL 10 MG/ML IV BOLUS
INTRAVENOUS | Status: AC
  Filled 2018-06-17: qty 20

## 2018-06-17 MED ORDER — ACETAMINOPHEN-CODEINE #3 300-30 MG PO TABS: 1.0000 | ORAL_TABLET | Freq: Four times a day (QID) | ORAL | 0 refills | Status: AC | PRN

## 2018-06-17 MED ORDER — LIDOCAINE HCL (CARDIAC) PF 100 MG/5ML IV SOSY
PREFILLED_SYRINGE | INTRAVENOUS | Status: DC | PRN
  Administered 2018-06-17: 100 mg via INTRAVENOUS

## 2018-06-17 MED ORDER — SUCCINYLCHOLINE CHLORIDE 20 MG/ML IJ SOLN
INTRAMUSCULAR | Status: DC | PRN
  Administered 2018-06-17: 100 mg via INTRAVENOUS

## 2018-06-17 MED ORDER — FAMOTIDINE 20 MG PO TABS
ORAL_TABLET | ORAL | Status: AC
  Filled 2018-06-17: qty 1

## 2018-06-17 SURGICAL SUPPLY — 15 items
CATH ROBINSON RED A/P 16FR (CATHETERS) ×3 IMPLANT
GLOVE BIOGEL PI IND STRL 6.5 (GLOVE) ×1 IMPLANT
GLOVE BIOGEL PI INDICATOR 6.5 (GLOVE) ×1
GLOVE SURG SYN 6.5 ES PF (GLOVE) ×2 IMPLANT
GLOVE SURG SYN 6.5 PF PI (GLOVE) ×1 IMPLANT
GOWN STRL REUS W/ TWL LRG LVL3 (GOWN DISPOSABLE) ×2 IMPLANT
GOWN STRL REUS W/TWL LRG LVL3 (GOWN DISPOSABLE) ×4
KIT TURNOVER CYSTO (KITS) ×2 IMPLANT
NS IRRIG 500ML POUR BTL (IV SOLUTION) ×2 IMPLANT
PACK DNC HYST (MISCELLANEOUS) ×2 IMPLANT
PAD OB MATERNITY 4.3X12.25 (PERSONAL CARE ITEMS) ×2 IMPLANT
PAD PREP 24X41 OB/GYN DISP (PERSONAL CARE ITEMS) ×2 IMPLANT
SUT POLY BUTTON 15MM (SUTURE) ×1 IMPLANT
SUT PROLENE 1 CT (SUTURE) ×1 IMPLANT
SUT PROLENE 2 TP 1 (SUTURE) IMPLANT

## 2018-06-17 NOTE — Anesthesia Procedure Notes (Signed)
Procedure Name: Intubation Date/Time: 06/17/2018 11:00 AM Performed by: Allean Found, CRNA Pre-anesthesia Checklist: Patient identified, Patient being monitored, Timeout performed, Emergency Drugs available and Suction available Patient Re-evaluated:Patient Re-evaluated prior to induction Oxygen Delivery Method: Circle system utilized Preoxygenation: Pre-oxygenation with 100% oxygen Induction Type: IV induction Ventilation: Mask ventilation without difficulty Laryngoscope Size: Mac and 3 Grade View: Grade I Tube type: Oral Tube size: 7.0 mm Number of attempts: 1 Airway Equipment and Method: Stylet Placement Confirmation: ETT inserted through vocal cords under direct vision,  positive ETCO2 and breath sounds checked- equal and bilateral Secured at: 21 cm Tube secured with: Tape Dental Injury: Teeth and Oropharynx as per pre-operative assessment

## 2018-06-17 NOTE — Interval H&P Note (Signed)
History and Physical Interval Note:  06/17/2018 10:08 AM  [name]  has presented today for surgery, with the diagnosis of history cervical incompetence  The various methods of treatment have been discussed with the patient and family. After consideration of risks, benefits and other options for treatment, the patient has consented to  Procedure(s): CERCLAGE CERVICAL (N/A) as a surgical intervention .  The patient's history has been reviewed, patient examined, no change in status, stable for surgery.  I have reviewed the patient's chart and labs.  Questions were answered to the patient's satisfaction.    Fetal heart rate on bedside US was 144bpm  Christanna R Schuman

## 2018-06-17 NOTE — Anesthesia Preprocedure Evaluation (Signed)
Anesthesia Evaluation  Patient identified by MRN, date of birth, ID band Patient awake    Reviewed: Allergy & Precautions, NPO status , Patient's Chart, lab work & pertinent test results, reviewed documented beta blocker date and time   Airway Mallampati: III  TM Distance: >3 FB     Dental  (+) Chipped   Pulmonary           Cardiovascular hypertension, Pt. on medications      Neuro/Psych    GI/Hepatic GERD  Controlled,  Endo/Other  Morbid obesity  Renal/GU      Musculoskeletal   Abdominal   Peds  Hematology   Anesthesia Other Findings   Reproductive/Obstetrics                             Anesthesia Physical Anesthesia Plan  ASA: III  Anesthesia Plan: Spinal   Post-op Pain Management:    Induction:   PONV Risk Score and Plan:   Airway Management Planned:   Additional Equipment:   Intra-op Plan:   Post-operative Plan:   Informed Consent: I have reviewed the patients History and Physical, chart, labs and discussed the procedure including the risks, benefits and alternatives for the proposed anesthesia with the patient or authorized representative who has indicated his/her understanding and acceptance.     Plan Discussed with: CRNA  Anesthesia Plan Comments:         Anesthesia Quick Evaluation

## 2018-06-17 NOTE — Op Note (Signed)
06/17/2018  11:29 AM  PATIENT:  Donna Navarro  [age] y.o. female  PRE-OPERATIVE DIAGNOSIS:  history cervical incompetence  POST-OPERATIVE DIAGNOSIS:  history cervical incompetence  PROCEDURE:  Procedure(s): CERCLAGE CERVICAL (N/A)  SURGEON:  Christanna R Schuman MD  ASSISTANTS: None  ANESTHESIA:   general  EBL:  5 mL   COMPLICATIONS: None      INDICATIONS: History of two prior cervical cerclages   FINDINGS: Cervix closed and long at time of cerclage placment  DICTATION: .Note written in EPIC Patient was taken to the OR where anesthesia was established. She was positioned into the dorsal lithotomy position with her feet in [employer]. She was prepped and draped in the usual sterile manner. The bladder was drained with a straight cath.  A weighted speculum was inserted into the posterior vaginal vault and a right angle retractor was used to visualize the cervix. The above findings were noted. A McDonald cerclage was then placed using #1 Prolene Monofilament suture. The anterior lip of the cervix was grasped at the 12 and 9 o'clock position with an allis clamp. The suture was passed from 12 to 9 o'clock through the cervical tissue. The initial suture was placed at the junction of the rugated vaginal epithelium and smooth cervix just distal to the vesicocervical reflection and at least 2 cm above the external os, as high as surgically feasible. The allis clamps were then moved to the 9 and 6 o'clock positions and the suture was passed from the 9 to the 6 o'clock position. The allis clamps were then moved to the 6 and 3 o'clock positions and the uture was passed from the 6 to the 3 o'clock position. The allis clamps were then moved to the 3 and 12 o'clock positions and the suture was passed from the 3 to the 12 o'clock position. Twelve knots were then tied at the 12 o'clock position. Excellent hemostasis was achieved throughout the procedure. The cervix was visually closed at the end of  the procedure. A second straight catheterization of the bladder showed that the urine was clear. All instruments were removed from the vagina and the procedure was discontinued. All counts were correct times two. The patient was taken to the recovery room in stable condition. There were no complications.  Fetal heart tones were obtained in the recovery area.  PLAN OF CARE: Discharge to home after PACU  PATIENT DISPOSITION:  PACU - hemodynamically stable.  Fetal heart rate in PACU was 146 bpm by ultrasound.     Adelene Idlerhristanna Schuman MD [facility], [facility] 06/17/18 11:29 AM

## 2018-06-17 NOTE — Anesthesia Post-op Follow-up Note (Signed)
Anesthesia QCDR form completed.        

## 2018-06-17 NOTE — Discharge Instructions (Addendum)
Cervical Cerclage, Care After  This sheet gives you information about how to care for yourself after your procedure. Your health care provider may also give you more specific instructions. If you have problems or questions, contact your health care provider. What can I expect after the procedure? After your procedure, it is common to have:  Cramping in your abdomen.  Mucus discharge for several days.  Painful urination (dysuria).  Small drops of blood coming from your vagina (spotting). Follow these instructions at home:  Follow instructions from your health care provider about bed rest, if this applies. You may need to be on bed rest for up to 3 days.  Take over-the-counter and prescription medicines only as told by your health care provider.  Do not drive or use heavy machinery while taking prescription pain medicine.  Keep track of your vaginal discharge and watch for any changes. If you notice changes, tell your health care provider.  Avoid physical activities and exercise until your health care provider approves. Ask your health care provider what activities are safe for you.  Until your health care provider approves: ? Do not douche. ? Do not have sexual intercourse.  Keep all pre-birth (prenatal) visits and all follow-up visits as told by your health care provider. This is important. You will probably have weekly visits to have your cervix checked, and you may need an ultrasound. Contact a health care provider if:  You have abnormal or bad-smelling vaginal discharge, such as clots.  You develop a rash on your skin. This may look like redness and swelling.  You become light-headed or feel like you are going to faint.  You have abdominal pain that does not get better with medicine.  You have persistent nausea or vomiting. Get help right away if:  You have vaginal bleeding that is heavier or more frequent than spotting.  You are leaking fluid or have a gush of fluid  from your vagina (your water breaks).  You have a fever or chills.  You faint.  You have uterine contractions. These may feel like: ? A back ache. ? Lower abdominal pain. ? Mild cramps, similar to menstrual cramps. ? Tightening or pressure in your abdomen.  You think that your baby is not moving as much as usual, or you cannot feel your baby move.  You have chest pain.  You have shortness of breath. This information is not intended to replace advice given to you by your health care provider. Make sure you discuss any questions you have with your health care provider. Document Released: 03/30/2013 Document Revised: 02/06/2016 Document Reviewed: 01/11/2016 Elsevier Interactive Patient Education  2019 [employer]   AMBULATORY SURGERY  DISCHARGE INSTRUCTIONS   1) The drugs that you were given will stay in your system until tomorrow so for the next 24 hours you should not:  A) Drive an automobile B) Make any legal decisions C) Drink any alcoholic beverage   2) You may resume regular meals tomorrow.  Today it is better to start with liquids and gradually work up to solid foods.  You may eat anything you prefer, but it is better to start with liquids, then soup and crackers, and gradually work up to solid foods.   3) Please notify your doctor immediately if you have any unusual bleeding, trouble breathing, redness and pain at the surgery site, drainage, fever, or pain not relieved by medication.    4) Additional Instructions:        Please contact your  physician with any problems or Same Day Surgery at [phone], Monday through Friday 6 am to 4 pm, or [facility] [place] Main number at [phone]. °

## 2018-06-17 NOTE — Transfer of Care (Signed)
Immediate Anesthesia Transfer of Care Note  Patient: [name]  Procedure(s) Performed: CERCLAGE CERVICAL (N/A )  Patient Location: PACU  Anesthesia Type:General  Level of Consciousness: awake, alert  and oriented  Airway & Oxygen Therapy: Patient Spontanous Breathing and Patient connected to nasal cannula oxygen  Post-op Assessment: Report given to RN and Post -op Vital signs reviewed and stable  Post vital signs: Reviewed and stable  Last Vitals:  Vitals Value Taken Time  BP 109/57 06/17/2018 11:33 AM  Temp    Pulse 94 06/17/2018 11:34 AM  Resp 12 06/17/2018 11:33 AM  SpO2 100 % 06/17/2018 11:34 AM  Vitals shown include unvalidated device data.  Last Pain:  Vitals:   06/17/18 1133  TempSrc:   PainSc: 0-No pain         Complications: No apparent anesthesia complications

## 2018-06-18 ENCOUNTER — Other Ambulatory Visit: Payer: Self-pay | Admitting: Obstetrics and Gynecology

## 2018-06-18 DIAGNOSIS — O98111 Syphilis complicating pregnancy, first trimester: Secondary | ICD-10-CM

## 2018-06-18 NOTE — Anesthesia Postprocedure Evaluation (Signed)
Anesthesia Post Note  Patient: Donna Navarro  Procedure(s) Performed: CERCLAGE CERVICAL (N/A )  Anesthesia Type: General     Last Vitals:  Vitals:   06/17/18 1222 06/17/18 1244  BP: (!) 115/54 111/63  Pulse: 77 71  Resp: 16 16  Temp: (!) 36.2 C   SpO2: 100% 100%    Last Pain:  Vitals:   06/17/18 1222  TempSrc: Temporal                 Sarita Hakanson S

## 2018-06-18 NOTE — Anesthesia Postprocedure Evaluation (Signed)
Anesthesia Post Note  Patient: Donna [name]  Procedure(s) Performed: CERCLAGE CERVICAL (N/A )  Patient location during evaluation: PACU Anesthesia Type: General Level of consciousness: awake and alert Pain management: pain level controlled Vital Signs Assessment: post-procedure vital signs reviewed and stable Respiratory status: spontaneous breathing, nonlabored ventilation, respiratory function stable and patient connected to nasal cannula oxygen Cardiovascular status: blood pressure returned to baseline and stable Postop Assessment: no apparent nausea or vomiting Anesthetic complications: no     Last Vitals:  Vitals:   06/17/18 1222 06/17/18 1244  BP: (!) 115/54 111/63  Pulse: 77 71  Resp: 16 16  Temp: (!) 36.2 C   SpO2: 100% 100%    Last Pain:  Vitals:   06/17/18 1222  TempSrc: Temporal                 Ruslan Mccabe S

## 2018-06-22 ENCOUNTER — Emergency Department
Admission: EM | Admit: 2018-06-22 | Discharge: 2018-06-22 | Disposition: A | Discharge status: Home / Self Care | Payer: Medicaid Other | Attending: Emergency Medicine | Admitting: Emergency Medicine

## 2018-06-22 ENCOUNTER — Other Ambulatory Visit: Payer: Self-pay

## 2018-06-22 ENCOUNTER — Encounter: Payer: Self-pay | Admitting: Emergency Medicine

## 2018-06-22 DIAGNOSIS — I10 Essential (primary) hypertension: Secondary | ICD-10-CM | POA: Insufficient documentation

## 2018-06-22 DIAGNOSIS — R197 Diarrhea, unspecified: Secondary | ICD-10-CM | POA: Insufficient documentation

## 2018-06-22 DIAGNOSIS — Z79899 Other long term (current) drug therapy: Secondary | ICD-10-CM | POA: Diagnosis not present

## 2018-06-22 LAB — COMPREHENSIVE METABOLIC PANEL
ALBUMIN: 3.3 g/dL — AB (ref 3.5–5.0)
ALT: 17 U/L (ref 0–44)
AST: 15 U/L (ref 15–41)
Alkaline Phosphatase: 68 U/L (ref 38–126)
Anion gap: 7 (ref 5–15)
BUN: 10 mg/dL (ref 6–20)
CHLORIDE: 106 mmol/L (ref 98–111)
CO2: 23 mmol/L (ref 22–32)
Calcium: 9.4 mg/dL (ref 8.9–10.3)
Creatinine, Ser: 0.65 mg/dL (ref 0.44–1.00)
GFR calc Af Amer: >60 mL/min (ref >60)
GFR calc non Af Amer: >60 mL/min (ref >60)
Glucose, Bld: 106 mg/dL — ABNORMAL HIGH (ref 70–99)
POTASSIUM: 3.6 mmol/L (ref 3.5–5.1)
Sodium: 136 mmol/L (ref 135–145)
Total Bilirubin: 0.6 mg/dL (ref 0.3–1.2)
Total Protein: 7.3 g/dL (ref 6.5–8.1)

## 2018-06-22 LAB — CBC
HCT: 37.5 % (ref 36.0–46.0)
HEMOGLOBIN: 12 g/dL (ref 12.0–15.0)
MCH: 28 pg (ref 26.0–34.0)
MCHC: 32 g/dL (ref 30.0–36.0)
MCV: 87.4 fL (ref 80.0–100.0)
Platelets: 295 10*3/uL (ref 150–400)
RBC: 4.29 MIL/uL (ref 3.87–5.11)
RDW: 14.5 % (ref 11.5–15.5)
WBC: 17.5 10*3/uL — ABNORMAL HIGH (ref 4.0–10.5)
nRBC: 0 % (ref 0.0–0.2)

## 2018-06-22 LAB — LIPASE, BLOOD: Lipase: 28 U/L (ref 11–51)

## 2018-06-22 NOTE — ED Triage Notes (Signed)
Here for diarrhea after eating last 3 days.  Pt is 3 months pregnant.  G4P3.  Had cerclage 06/17/18.  No vomiting. Ambulatory. Only some discomfort from recent procedure, no abdominal pain. No fevers.  VSS.

## 2018-06-22 NOTE — ED Provider Notes (Signed)
[facility] Emergency Department Provider Note   ____________________________________________    I have reviewed the triage vital signs and the nursing notes.   HISTORY  Chief Complaint Diarrhea     HPI Unknown FoleyJessica N Zima is a 32 y.o. female who reports that she is [redacted] weeks pregnant who presents today with complaints of diarrhea times the last 3 days.  Patient reports any solid food intake and is up with her having diarrhea.  She denies abdominal pain.  No vaginal bleeding.  No fevers chills or recent travel.  No sick contacts reported.  She does not take anything for this.  She does admit that stress may play a role this is recent diagnosis of syphilis.  Currently feels well no acute distress  Past Medical History:  Diagnosis Date  . GERD (gastroesophageal reflux disease)    with pregnancy-no meds  . Hypertension    lost weight and pcp took pt off bp meds 7 months  . Irregular periods   . Morbid obesity (HCC) 04/30/2017  . Ventral hernia without obstruction or gangrene 04/30/2017    Patient Active Problem List   Diagnosis Date Noted  . Pregnancy, supervision, high-risk, first trimester 05/17/2018  . Cervical incompetence 05/17/2018  . Hx of cerclage, currently pregnant 05/17/2018  . 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 Ventral hernia without obstruction or gangrene 04/30/2017  . Morbid obesity (HCC) 04/30/2017    Past Surgical History:  Procedure Laterality Date  . CERVICAL CERCLAGE N/A 06/17/2018   Procedure: CERCLAGE CERVICAL;  Surgeon: Natale MilchSchuman, Christanna R, MD;  Location: [facility] ORS;  Service: Gynecology;  Laterality: N/A;  . CHOLECYSTECTOMY      Prior to Admission medications   Medication Sig Start Date End Date Taking? Authorizing Provider  prenatal vitamin w/FE, FA (NATACHEW) 29-1 MG CHEW chewable tablet Chew 1 tablet by mouth daily.     [provider]     Allergies Patient has no known allergies.  Family History  Problem  Relation Age of Onset  . Heart disease Mother        Massive Heart Attack    Social History Social History   Tobacco Use  . Smoking status: Never Smoker  . Smokeless tobacco: Never Used  Substance Use Topics  . Alcohol use: Never    Frequency: Never  . Drug use: Never    Review of Systems  Constitutional: No fever/chills Eyes: No visual changes.  ENT: No sore throat. Cardiovascular: Denies chest pain. Respiratory: Denies shortness of breath. Gastrointestinal: as above Genitourinary: Negative for dysuria.no vaginal bleeding Musculoskeletal: Negative for back pain. Skin: Negative for rash. Neurological: Negative for headaches or weakness   ____________________________________________   PHYSICAL EXAM:  VITAL SIGNS: ED Triage Vitals  Enc Vitals Group     BP 06/22/18 1041 (!) 178/103     Pulse Rate 06/22/18 1041 97     Resp 06/22/18 1041 18     Temp 06/22/18 1041 98.7 F (37.1 C)     Temp Source 06/22/18 1041 Oral     SpO2 06/22/18 1041 100 %     Weight 06/22/18 1042 132.5 kg (292 lb)     Height 06/22/18 1042 1.727 m (5\' 8" )     Head Circumference --      Peak Flow --      Pain Score 06/22/18 1042 0     Pain Loc --      Pain Edu? --      Excl. in GC? --  Constitutional: Alert and oriented. No acute distress. Pleasant and interactive Eyes: Conjunctivae are normal.  Head: Atraumatic.  Cardiovascular: Normal rate, regular rhythm.   Good peripheral circulation. Respiratory: Normal respiratory effort.  No retractions.  Gastrointestinal: Soft and nontender. No distention.   Genitourinary: deferred Musculoskeletal:   Warm and well perfused Neurologic:  Normal speech and language. No gross focal neurologic deficits are appreciated.  Skin:  Skin is warm, dry and intact. No rash noted. Psychiatric: Mood and affect are normal. Speech and behavior are normal.  ____________________________________________   LABS (all labs ordered are listed, but only abnormal  results are displayed)  Labs Reviewed  COMPREHENSIVE METABOLIC PANEL - Abnormal; Notable for the following components:      Result Value   Glucose, Bld 106 (*)    Albumin 3.3 (*)    All other components within normal limits  CBC - Abnormal; Notable for the following components:   WBC 17.5 (*)    All other components within normal limits  LIPASE, BLOOD   ____________________________________________  EKG   ____________________________________________  RADIOLOGY   ____________________________________________   PROCEDURES  Procedure(s) performed: No  Procedures   Critical Care performed: No ____________________________________________   INITIAL IMPRESSION / ASSESSMENT AND PLAN / ED COURSE  Pertinent labs & imaging results that were available during my care of the patient were reviewed by me and considered in my medical decision making (see chart for details).  Patient presents with 3 days of diarrhea, presumed infectious possibly functional.  Elevated white blood cell count of unclear significance given chronically elevated.  She has treatment arranged with her OB/GYN for syphilis diagnosis.  Recommend supportive care at this time.  Patient is comfortable with this plan    ____________________________________________   FINAL CLINICAL IMPRESSION(S) / ED DIAGNOSES  Final diagnoses:  Diarrhea of presumed infectious origin        Note:  This document was prepared using Dragon voice recognition software and may include unintentional dictation errors.   Jene EveryKinner, Telly Broberg, MD 06/22/18 2119

## 2018-06-23 NOTE — L&D Delivery Note (Signed)
Vaginal Delivery Note  Spontaneous delivery of live viable female infant from the LOA position through an intact  perineum. Delivery of anterior right shoulder with gentle downward guidance followed by delivery of the left posterior shoulder with gentle upward guidance. Body followed spontaneously. Infant placed on maternal chest. Nursery present and helped with neonatal resuscitation and evaluation. Cord clamped and cut after one minute. Cord blood not collected. Placenta delivered spontaneously and intact with a 3 vessel cord.  Bilateral labial laceration. Uterus firm and below umbilicus at the end of the delivery.  Mom and baby recovering in stable condition. Sponge and needle counts were correct at the end of the delivery.  APGARS: 1 minute:8 5 minutes: 9 Weight: pending  Adrian Prows MD [place] 12/15/18 6:18 PM

## 2018-06-24 ENCOUNTER — Telehealth: Payer: Self-pay

## 2018-06-24 LAB — HM HIV SCREENING LAB: HM HIV Screening: NEGATIVE

## 2018-06-24 NOTE — Telephone Encounter (Signed)
[facility] calling to confirm/verify +syphyllis so they can give tx.  Confirmed + test.

## 2018-06-28 ENCOUNTER — Encounter: Payer: Self-pay | Admitting: Maternal Newborn

## 2018-06-28 ENCOUNTER — Telehealth: Payer: Self-pay

## 2018-06-28 ENCOUNTER — Ambulatory Visit (INDEPENDENT_AMBULATORY_CARE_PROVIDER_SITE_OTHER): Payer: Medicaid Other | Admitting: Maternal Newborn

## 2018-06-28 VITALS — BP 100/70 | Wt 294.0 lb

## 2018-06-28 DIAGNOSIS — O3432 Maternal care for cervical incompetence, second trimester: Secondary | ICD-10-CM

## 2018-06-28 DIAGNOSIS — Z3A14 14 weeks gestation of pregnancy: Secondary | ICD-10-CM

## 2018-06-28 DIAGNOSIS — O0991 Supervision of high risk pregnancy, unspecified, first trimester: Secondary | ICD-10-CM

## 2018-06-28 DIAGNOSIS — O3430 Maternal care for cervical incompetence, unspecified trimester: Secondary | ICD-10-CM

## 2018-06-28 DIAGNOSIS — O09299 Supervision of pregnancy with other poor reproductive or obstetric history, unspecified trimester: Secondary | ICD-10-CM

## 2018-06-28 DIAGNOSIS — O09292 Supervision of pregnancy with other poor reproductive or obstetric history, second trimester: Secondary | ICD-10-CM

## 2018-06-28 DIAGNOSIS — Z9889 Other specified postprocedural states: Secondary | ICD-10-CM

## 2018-06-28 LAB — POCT URINALYSIS DIPSTICK OB
Glucose, UA: NEGATIVE
POC,PROTEIN,UA: NEGATIVE

## 2018-06-28 NOTE — Progress Notes (Signed)
Discussed with patient positive syphilis result in PACU Referral to ID made

## 2018-06-28 NOTE — Telephone Encounter (Signed)
Velna Ochs calling from the [facility] Dept calling for more information regarding pt's positive RPR.  [phone].  Information given.

## 2018-06-28 NOTE — Progress Notes (Signed)
Routine Prenatal Care Visit  Subjective  Donna Navarro is a 33 y.o. [id number] at 3023w5d being seen today for ongoing prenatal care.  She is currently monitored for the following issues for this high-risk pregnancy and has Ventral hernia without obstruction or gangrene; Morbid obesity (HCC); Oligomenorrhea; Pregnancy, supervision, high-risk, first trimester; Cervical incompetence; Hx of cerclage, currently pregnant; and Cervical cerclage suture present, antepartum on their problem list.  ----------------------------------------------------------------------------------- Patient reports that nausea has improved and she is only occasionally nauseated.  Mild stretching sensations occasionally in lower abdomen. Vag. Bleeding: None.   No leaking of fluid.  ----------------------------------------------------------------------------------- The following portions of the patient's history were reviewed and updated as appropriate: allergies, current medications, past family history, past medical history, past social history, past surgical history and problem list. Problem list updated.  Objective  Blood pressure 100/70, weight 294 lb (133.4 kg), last menstrual period 03/16/2018. Pregravid weight Pregravid weight not on file Total Weight Gain Not found.   Urinalysis: Urine dipstick shows negative for glucose, protein.  Fetal Status: Fetal Heart Rate (bpm): 161          General:  Alert, oriented and cooperative. Patient is in no acute distress.  Skin: Skin is warm and dry. No rash noted.   Cardiovascular: Normal heart rate noted  Respiratory: Normal respiratory effort, no problems with respiration noted  Abdomen: Soft, gravid, appropriate for gestational age. Pain/Pressure: Present     Pelvic:  Cervical exam deferred        Extremities: Normal range of motion.     Mental Status: Normal mood and affect. Normal behavior. Normal judgment and thought content.     Assessment   32 y.o. [id number] at  3023w5d, EDD 12/22/2018 Alternate EDD Entry presenting for a routine prenatal visit.  Plan   Pregnancy #6 Problems (from 05/17/18 to present)    Problem Noted Resolved   Pregnancy, supervision, high-risk, first trimester 05/17/2018 by Natale MilchSchuman, Christanna R, MD No   Overview Addendum 06/11/2018  5:43 PM by Natale MilchSchuman, Christanna R, MD      [facility] Prenatal Labs  Dating  T1 US Blood type:     Genetic Screen NIPS:   Normal XX Antibody:   Anatomic US  Rubella:   Varicella: @VZVIGG @  GTT Early:        28 wk:      RPR:     Rhogam  HBsAg:     TDaP vaccine                       HIV:     Flu Shot                                GBS:   Contraception  BTL- [ ]  consents Pap: NIL 2019  CBB     CS/VBAC    Baby Food  bottle   Support Person   significant other           Cervical incompetence 05/17/2018 by Natale MilchSchuman, Christanna R, MD No   Hx of cerclage, currently pregnant 05/17/2018 by Natale MilchSchuman, Christanna R, MD No   Overview Signed 05/17/2018  2:25 PM by Natale MilchSchuman, Christanna R, MD    - cerclage after [redacted] weeks gestation [ ]         She reports that she is being [facility].   Please refer to After Visit Summary for other  counseling recommendations.   Return in about 4 weeks (around 07/26/2018) for ROB.  Marcelyn BruinsJacelyn , CNM 06/28/2018  4:16 PM

## 2018-06-28 NOTE — Patient Instructions (Signed)

## 2018-06-29 ENCOUNTER — Ambulatory Visit: Payer: Medicaid Other | Admitting: Infectious Diseases

## 2018-06-29 NOTE — Telephone Encounter (Signed)
Cory fro [facility] Dept calling today asking if we had done a previous RPR on her and if we did an HIV on her. Adv this preg is the first we have seen her so no prev RPR.  HIV non reactive.

## 2018-07-08 ENCOUNTER — Telehealth: Payer: Self-pay

## 2018-07-08 NOTE — Telephone Encounter (Signed)
Pt is 16w; problems c hernia; what to do?; pressure and pulling.  [phone]

## 2018-07-13 ENCOUNTER — Other Ambulatory Visit: Payer: Self-pay

## 2018-07-13 ENCOUNTER — Encounter: Payer: Self-pay | Admitting: Emergency Medicine

## 2018-07-13 ENCOUNTER — Emergency Department
Admission: EM | Admit: 2018-07-13 | Discharge: 2018-07-14 | Disposition: A | Discharge status: Home / Self Care | Payer: Medicaid Other | Attending: Emergency Medicine | Admitting: Emergency Medicine

## 2018-07-13 DIAGNOSIS — N3000 Acute cystitis without hematuria: Secondary | ICD-10-CM | POA: Diagnosis not present

## 2018-07-13 DIAGNOSIS — O26892 Other specified pregnancy related conditions, second trimester: Secondary | ICD-10-CM | POA: Diagnosis present

## 2018-07-13 DIAGNOSIS — I10 Essential (primary) hypertension: Secondary | ICD-10-CM | POA: Insufficient documentation

## 2018-07-13 DIAGNOSIS — K429 Umbilical hernia without obstruction or gangrene: Secondary | ICD-10-CM | POA: Diagnosis not present

## 2018-07-13 DIAGNOSIS — Z3A17 17 weeks gestation of pregnancy: Secondary | ICD-10-CM | POA: Diagnosis not present

## 2018-07-13 DIAGNOSIS — R112 Nausea with vomiting, unspecified: Secondary | ICD-10-CM

## 2018-07-13 DIAGNOSIS — M25551 Pain in right hip: Secondary | ICD-10-CM

## 2018-07-13 DIAGNOSIS — O219 Vomiting of pregnancy, unspecified: Secondary | ICD-10-CM | POA: Insufficient documentation

## 2018-07-13 LAB — COMPREHENSIVE METABOLIC PANEL
ALT: 16 U/L (ref 0–44)
AST: 15 U/L (ref 15–41)
Albumin: 3.2 g/dL — ABNORMAL LOW (ref 3.5–5.0)
Alkaline Phosphatase: 71 U/L (ref 38–126)
Anion gap: 6 (ref 5–15)
BUN: 10 mg/dL (ref 6–20)
CO2: 22 mmol/L (ref 22–32)
Calcium: 8.9 mg/dL (ref 8.9–10.3)
Chloride: 106 mmol/L (ref 98–111)
Creatinine, Ser: 0.78 mg/dL (ref 0.44–1.00)
GFR calc Af Amer: >60 mL/min (ref >60)
GFR calc non Af Amer: >60 mL/min (ref >60)
Glucose, Bld: 116 mg/dL — ABNORMAL HIGH (ref 70–99)
POTASSIUM: 3.4 mmol/L — AB (ref 3.5–5.1)
Sodium: 134 mmol/L — ABNORMAL LOW (ref 135–145)
Total Bilirubin: 0.4 mg/dL (ref 0.3–1.2)
Total Protein: 7.1 g/dL (ref 6.5–8.1)

## 2018-07-13 LAB — URINALYSIS, COMPLETE (UACMP) WITH MICROSCOPIC
Bilirubin Urine: NEGATIVE
Glucose, UA: NEGATIVE mg/dL
Ketones, ur: NEGATIVE mg/dL
Nitrite: NEGATIVE
Protein, ur: 30 mg/dL — AB
Specific Gravity, Urine: 1.035 — ABNORMAL HIGH (ref 1.005–1.030)
pH: 5 (ref 5.0–8.0)

## 2018-07-13 LAB — CBC
HCT: 37.3 % (ref 36.0–46.0)
Hemoglobin: 12.2 g/dL (ref 12.0–15.0)
MCH: 28.6 pg (ref 26.0–34.0)
MCHC: 32.7 g/dL (ref 30.0–36.0)
MCV: 87.6 fL (ref 80.0–100.0)
NRBC: 0 % (ref 0.0–0.2)
Platelets: 311 10*3/uL (ref 150–400)
RBC: 4.26 MIL/uL (ref 3.87–5.11)
RDW: 14.2 % (ref 11.5–15.5)
WBC: 19.2 10*3/uL — AB (ref 4.0–10.5)

## 2018-07-13 MED ORDER — NITROFURANTOIN MONOHYD MACRO 100 MG PO CAPS: 100.0000 mg | ORAL_CAPSULE | Freq: Two times a day (BID) | ORAL | 0 refills | Status: AC

## 2018-07-13 NOTE — Discharge Instructions (Signed)
You may continue to take Tylenol, and try positional changes for your right hip pain.  Please take the entire course of antibiotics for your urinary tract infection, even if you are feeling better.  Drink plenty of fluids stay well-hydrated and to help your urinary tract infection.  Call your OB/GYN appointment line to schedule an appointment in the next 1 to 2 days.  I have spoken with Dr. Jean Rosenthal, 1 of the doctors at [facility], and they will reevaluate you for your symptoms.  Return to the emergency department if you develop severe pain, decreased movement of the baby, vaginal bleeding or vaginal discharge, gush of fluid, fever, or any other symptoms concerning to you.

## 2018-07-13 NOTE — ED Notes (Signed)
Dr norman at bedside. 

## 2018-07-13 NOTE — ED Notes (Signed)
Pt c/o pain around right groin.  [redacted] weeks pregnant. G4P2. History of still born. Had cerclage at 11 weeks. No vaginal discharge or bleeding. Has been feeling pressure in vagina for 3 days.  No fevers. No vomiting.

## 2018-07-13 NOTE — ED Notes (Signed)
Patient ambulatory to Rm 2, Vikki Ports RN aware of room placement and need for FHT.

## 2018-07-13 NOTE — ED Provider Notes (Signed)
[facility] Emergency Department Provider Note  ____________________________________________  Time seen: Approximately 10:21 AM  I have reviewed the triage vital signs and the nursing notes.   HISTORY  Chief Complaint Abdominal Pain and Hip Pain    HPI [name] is a 33 y.o. female G4 P2 with a history of stillborn at 24 weeks, prior cerclage, approximately [redacted] weeks pregnant with cerclage placed at 11 weeks, presenting for umbilical hernia, right hip pain, and pelvic pressure with urinary symptoms.  The patient reports that she has had a large umbilical hernia for many years, and prior to having surgical repair she found out she was pregnant.  She has some mild discomfort associated with this hernia, but is able to reduce it when she pushes on it or changes position.  She has not had any associated nausea vomiting or diarrhea, and is not currently complaining of hernia discomfort.  She does report that over the last several days, she has had low pelvic pressure sensation with associated sensation of needing to urinate but very little urine coming out.  She has not had any dysuria, change in vaginal discharge, vaginal bleeding, or gush of fluid.  She has had normal fetal movement.  She has not had any fevers or chills, nausea or vomiting.  In addition, the patient also reports that she intermittently has a dull ache in the right hip, which is better when she lays with a pillow between her hips and improves with Tylenol.  She has no difficulty walking.  Past Medical History:  Diagnosis Date  . GERD (gastroesophageal reflux disease)    with pregnancy-no meds  . Hypertension    lost weight and pcp took pt off bp meds 7 months  . Irregular periods   . Morbid obesity (HCC) 04/30/2017  . Ventral hernia without obstruction or gangrene 04/30/2017    Patient Active Problem List   Diagnosis Date Noted  . Cervical cerclage suture present, antepartum 06/28/2018  .  Pregnancy, supervision, high-risk, first trimester 05/17/2018  . Cervical incompetence 05/17/2018  . Hx of cerclage, currently pregnant 05/17/2018  . 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 Ventral hernia without obstruction or gangrene 04/30/2017  . Morbid obesity (HCC) 04/30/2017    Past Surgical History:  Procedure Laterality Date  . CERVICAL CERCLAGE N/A 06/17/2018   Procedure: CERCLAGE CERVICAL;  Surgeon: Natale MilchSchuman, Christanna R, MD;  Location: [facility] ORS;  Service: Gynecology;  Laterality: N/A;  . CHOLECYSTECTOMY      Current Outpatient Rx  . Order #: [id number] Class: Historical Med  . Order #: [id number] Class: Print    Allergies Patient has no known allergies.  Family History  Problem Relation Age of Onset  . Heart disease Mother        Massive Heart Attack    Social History Social History   Tobacco Use  . Smoking status: Never Smoker  . Smokeless tobacco: Never Used  Substance Use Topics  . Alcohol use: Never    Frequency: Never  . Drug use: Never    Review of Systems Constitutional: No fever/chills.  No lightheadedness or syncope.  No trauma. Eyes: No visual changes. ENT: No sore throat. No congestion or rhinorrhea. Cardiovascular: Denies chest pain. Denies palpitations. Respiratory: Denies shortness of breath.  No cough. Gastrointestinal: No abdominal pain.  Onset of reducible umbilical hernia.  No nausea, no vomiting.  No diarrhea.  No constipation. Genitourinary: Negative for dysuria.  Positive sensation of having to urinate with low output.  No vaginal discharge, vaginal bleeding, gush  of fluid.  Normal fetal movement. Musculoskeletal: Negative for back pain.  Positive right hip pain. Skin: Negative for rash. Neurological: Negative for headaches. No focal numbness, tingling or weakness.     ____________________________________________   PHYSICAL EXAM:  VITAL SIGNS: ED Triage Vitals  Enc Vitals Group     BP 07/13/18 0805 (!) 134/93     Pulse Rate 07/13/18  0805 89     Resp 07/13/18 0805 16     Temp 07/13/18 0805 97.9 F (36.6 C)     Temp Source 07/13/18 0805 Oral     SpO2 07/13/18 0805 100 %     Weight 07/13/18 0806 293 lb 14 oz (133.3 kg)     Height 07/13/18 0806 5\' 8"  (1.727 m)     Head Circumference --      Peak Flow --      Pain Score 07/13/18 0806 6     Pain Loc --      Pain Edu? --      Excl. in GC? --     Constitutional: Alert and oriented.  Answers questions appropriately. Eyes: Conjunctivae are normal.  EOMI. No scleral icterus. Head: Atraumatic. Nose: No congestion/rhinnorhea. Mouth/Throat: Mucous membranes are moist.  Neck: No stridor.  Supple.   Cardiovascular: Normal rate, regular rhythm. No murmurs, rubs or gallops.  Respiratory: Normal respiratory effort.  No accessory muscle use or retractions. Lungs CTAB.  No wheezes, rales or ronchi. Gastrointestinal: Soft, and nondistended.  Orbitally obese.  The patient does have a large, grapefruit size reducible umbilical hernia without any overlying severe pain.  No Murphy sign.  No guarding or rebound.  No peritoneal signs. Genitourinary: Patient was offered pelvic examination.  It was deferred as the patient will follow-up with OB/GYN in the next 1 to 2 days.  I did speak to Dr. Jean Rosenthal and he is aware that pelvic examination was not performed in the emergency department. Musculoskeletal: No LE edema.  Pelvis is stable.  The patient has full range of motion of the bilateral hips, knees and ankles without pain.  Normal sensation to light touch in the lower right lower extremity Neurologic:  A&Ox3.  Speech is clear.  Face and smile are symmetric.  EOMI.  Moves all extremities well. Skin:  Skin is warm, dry and intact. No rash noted. Psychiatric: Mood and affect are normal. Speech and behavior are normal.  Normal judgement.  ____________________________________________   LABS (all labs ordered are listed, but only abnormal results are displayed)  Labs Reviewed  COMPREHENSIVE  METABOLIC PANEL - Abnormal; Notable for the following components:      Result Value   Sodium 134 (*)    Potassium 3.4 (*)    Glucose, Bld 116 (*)    Albumin 3.2 (*)    All other components within normal limits  CBC - Abnormal; Notable for the following components:   WBC 19.2 (*)    All other components within normal limits  URINALYSIS, COMPLETE (UACMP) WITH MICROSCOPIC - Abnormal; Notable for the following components:   Color, Urine YELLOW (*)    APPearance HAZY (*)    Specific Gravity, Urine 1.035 (*)    Hgb urine dipstick SMALL (*)    Protein, ur 30 (*)    Leukocytes, UA MODERATE (*)    Bacteria, UA MANY (*)    All other components within normal limits  URINE CULTURE   ____________________________________________  EKG  Not indicated ____________________________________________  RADIOLOGY  No results found.  ____________________________________________   PROCEDURES  Procedure(s) performed: None  Procedures  Critical Care performed: No ____________________________________________   INITIAL IMPRESSION / ASSESSMENT AND PLAN / ED COURSE  Pertinent labs & imaging results that were available during my care of the patient were reviewed by me and considered in my medical decision making (see chart for details).  32 y.o. female approximately [redacted] weeks pregnant with a cerclage 80presenting with pelvic pressure and urinary symptoms, also right hip pain, and reducible umbilical hernia that is not bothering her today.  Overall, the patient's primary concern is a pelvic pressure, which may be due to UTI.  She did not give a clean-catch sample, but there is enough suggestive evidence of UTI that I will initiate her on Macrobid.  She has normal fetal heart tones, and has no other red flag symptoms.  Pelvic examination was considered and offered, but the patient prefers to follow-up with [facility] as she knows that if there were any abnormalities especially with her  cerclage I would not be able to intervene.  I have spoken with Dr. Jean RosenthalJackson, her OB/GYN, who will see her in the next 1 to 2 days in the outpatient setting for pelvic examination and possible ultrasound as needed.  We discussed getting an ultrasound in the ED, but it is unlikely that this would yield any additional information.  The patient's right hip does not appear to have any acute abnormalities and may be due to laxity from pregnancy, but I will have her continue to follow with her OB/GYN for this as well.  Follow-up instructions as well as return precautions were discussed.  ____________________________________________  FINAL CLINICAL IMPRESSION(S) / ED DIAGNOSES  Final diagnoses:  Umbilical hernia without obstruction and without gangrene  Acute cystitis without hematuria  Right hip pain  Nausea and vomiting, intractability of vomiting not specified, unspecified vomiting type         NEW MEDICATIONS STARTED DURING THIS VISIT:  New Prescriptions   NITROFURANTOIN, MACROCRYSTAL-MONOHYDRATE, (MACROBID) 100 MG CAPSULE    Take 1 capsule (100 mg total) by mouth 2 (two) times daily for 7 days.      Rockne MenghiniNorman, Anne-Caroline, MD 07/13/18 1110

## 2018-07-13 NOTE — ED Triage Notes (Addendum)
Says pain low abd with feeling of urinary urgency, but it also goes into right hip area.  Says [redacted] week pregnant

## 2018-07-14 LAB — URINE CULTURE

## 2018-07-26 ENCOUNTER — Ambulatory Visit (INDEPENDENT_AMBULATORY_CARE_PROVIDER_SITE_OTHER): Payer: Medicaid Other | Admitting: Obstetrics and Gynecology

## 2018-07-26 ENCOUNTER — Encounter: Payer: Self-pay | Admitting: Obstetrics and Gynecology

## 2018-07-26 VITALS — BP 122/80 | Wt 296.0 lb

## 2018-07-26 DIAGNOSIS — Z3A18 18 weeks gestation of pregnancy: Secondary | ICD-10-CM

## 2018-07-26 DIAGNOSIS — O0992 Supervision of high risk pregnancy, unspecified, second trimester: Secondary | ICD-10-CM

## 2018-07-26 DIAGNOSIS — K439 Ventral hernia without obstruction or gangrene: Secondary | ICD-10-CM

## 2018-07-26 DIAGNOSIS — O3430 Maternal care for cervical incompetence, unspecified trimester: Secondary | ICD-10-CM

## 2018-07-26 DIAGNOSIS — O0990 Supervision of high risk pregnancy, unspecified, unspecified trimester: Secondary | ICD-10-CM

## 2018-07-26 DIAGNOSIS — O3432 Maternal care for cervical incompetence, second trimester: Secondary | ICD-10-CM

## 2018-07-26 NOTE — Progress Notes (Signed)
ROB C/o Hernia is hurting Declines flu shot

## 2018-07-26 NOTE — Progress Notes (Signed)
Routine Prenatal Care Visit  Subjective  Donna Navarro is a 33 y.o. [id number] at [redacted]w[redacted]d being seen today for ongoing prenatal care.  She is currently monitored for the following issues for this high-risk pregnancy and has Ventral hernia without obstruction or gangrene; Morbid obesity (HCC); Oligomenorrhea; High-risk pregnancy supervision, unspecified trimester; Cervical incompetence; Hx of cerclage, currently pregnant; and Cervical cerclage suture present, antepartum on their problem list.  ----------------------------------------------------------------------------------- Patient reports pain with her hernia.  She has been trying to lay down to help with the pain however her hernia does not reduce.  She reports that she has had an increase in vomiting.  She does not vomit every day but reports that with some movements it will cause her to vomit.  She is having daily bowel movements although she reports that usually she has bowel movements several times a day.  She has not had fevers.  Contractions: Not present. Vag. Bleeding: None.  Movement: Absent. Denies leaking of fluid.  ----------------------------------------------------------------------------------- The following portions of the patient's history were reviewed and updated as appropriate: allergies, current medications, past family history, past medical history, past social history, past surgical history and problem list. Problem list updated.   Objective  Blood pressure 122/80, weight 296 lb (134.3 kg), last menstrual period 03/16/2018. Pregravid weight 289 lb (131.1 kg) Total Weight Gain 7 lb (3.175 kg) Urinalysis:      Fetal Status: Fetal Heart Rate (bpm): 155   Movement: Absent     General:  Alert, oriented and cooperative. Patient is in no acute distress.  Skin: Skin is warm and dry. No rash noted.   Cardiovascular: Normal heart rate noted  Respiratory: Normal respiratory effort, no problems with respiration noted  Abdomen:  Soft, gravid, appropriate for gestational age. Pain/Pressure: Present     Pelvic:  Cervical exam deferred        Extremities: Normal range of motion.  Edema: None  Mental Status: Normal mood and affect. Normal behavior. Normal judgment and thought content.     Assessment   33 y.o. [id number] at [redacted]w[redacted]d by  12/22/2018, Alternate EDD Entry presenting for routine prenatal visit  Plan   Pregnancy #6 Problems (from 05/17/18 to present)    Problem Noted Resolved   High-risk pregnancy supervision, unspecified trimester 05/17/2018 by Natale Milch, MD No   Overview Addendum 07/26/2018  5:11 PM by Natale Milch, MD      [facility]inic [facility] Prenatal Labs  Dating  T1 [place] Blood type: --/--/A POS (12/23 1305)   Genetic Screen NIPS:   Normal XX Antibody:NEG (12/23 1305)  Anatomic [place]  Rubella: 2.87 (12/20 1636) Varicella: Immune  GTT Early: hgba1c 5.4       28 wk:      RPR: Reactive (12/20 1636)  Treatment at [facility]  Rhogam  not needed HBsAg: Negative (12/20 1636)   TDaP vaccine                       HIV: Non Reactive (12/20 1636)   Flu Shot    Declines                            GBS:   Contraception  BTL- [ ]  consents Pap: NIL 2019  CBB     CS/VBAC    Baby Food  bottle   Support Person   significant other           Cervical incompetence  05/17/2018 by Natale Milch, MD No   Hx of cerclage, currently pregnant 05/17/2018 by Natale Milch, MD No   Overview Addendum 06/28/2018  4:16 PM by Oswaldo Conroy, CNM    - cerclage after [redacted] weeks gestation [x]            Gestational age appropriate obstetric precautions including but not limited to vaginal bleeding, contractions, leaking of fluid and fetal movement were reviewed in detail with the patient.    Discussed with the patient that hernias are not generally repaired during pregnancy however her hernia has a large amount of bowel that is not reducible through the hernia.  Given that she is having continued pain and  vomiting likely related to the hernia repair may be appropriate.  Will refer her to general surgery for their opinion about surgical reduction of the hernia and repair during pregnancy.  The second trimester is generally the safest for surgery to occur between 18 and 24 weeks.   Anatomy US at next visit.  Return in about 2 weeks (around 08/09/2018) for ROB and [place].  Natale Milch MD [facility], [facility] 07/26/2018, 5:25 PM

## 2018-07-28 ENCOUNTER — Ambulatory Visit (INDEPENDENT_AMBULATORY_CARE_PROVIDER_SITE_OTHER): Payer: Medicaid Other | Admitting: Surgery

## 2018-07-28 ENCOUNTER — Other Ambulatory Visit: Payer: Self-pay

## 2018-07-28 ENCOUNTER — Encounter: Payer: Self-pay | Admitting: Surgery

## 2018-07-28 VITALS — BP 144/88 | HR 92 | Temp 97.7°F | Ht 68.0 in | Wt 300.8 lb

## 2018-07-28 DIAGNOSIS — K432 Incisional hernia without obstruction or gangrene: Secondary | ICD-10-CM | POA: Diagnosis not present

## 2018-07-28 NOTE — Patient Instructions (Addendum)
Patient will need to return to the office in 5 months with Dr.Pabon to discuss hernia repair surgery. Use ice to help with pain.  Call the office with any questions or concerns.   Ventral Hernia  A ventral hernia is a bulge of tissue from inside the abdomen that pushes through a weak area of the muscles that form the front wall of the abdomen. The tissues inside the abdomen are inside a sac (peritoneum). These tissues include the small intestine, large intestine, and the fatty tissue that covers the intestines (omentum). Sometimes, the bulge that forms a hernia contains intestines. Other hernias contain only fat. Ventral hernias do not go away without surgical treatment. There are several types of ventral hernias. You may have:  A hernia at an incision site from previous abdominal surgery (incisional hernia).  A hernia just above the belly button (epigastric hernia), or at the belly button (umbilical hernia). These types of hernias can develop from heavy lifting or straining.  A hernia that comes and goes (reducible hernia). It may be visible only when you lift or strain. This type of hernia can be pushed back into the abdomen (reduced).  A hernia that traps abdominal tissue inside the hernia (incarcerated hernia). This type of hernia does not reduce.  A hernia that cuts off blood flow to the tissues inside the hernia (strangulated hernia). The tissues can start to die if this happens. This is a very painful bulge that cannot be reduced. A strangulated hernia is a medical emergency. What are the causes? This condition is caused by abdominal tissue putting pressure on an area of weakness in the abdominal muscles. What increases the risk? The following factors may make you more likely to develop this condition:  Being female.  Being 68 or older.  Being overweight or obese.  Having had previous abdominal surgery, especially if there was an infection after surgery.  Having had an injury to  the abdominal wall.  Having had several pregnancies.  Having a buildup of fluid inside the abdomen (ascites). What are the signs or symptoms? The only symptom of a ventral hernia may be a painless bulge in the abdomen. A reducible hernia may be visible only when you strain, cough, or lift. Other symptoms may include:  Dull pain.  A feeling of pressure. Signs and symptoms of a strangulated hernia may include:  Increasing pain.  Nausea and vomiting.  Pain when pressing on the hernia.  The skin over the hernia turning red or purple.  Constipation.  Blood in the stool (feces). How is this diagnosed? This condition may be diagnosed based on:  Your symptoms.  Your medical history.  A physical exam. You may be asked to cough or strain while standing. These actions increase the pressure inside your abdomen and force the hernia through the opening in your muscles. Your health care provider may try to reduce the hernia by pressing on it.  Imaging studies, such as an ultrasound or CT scan. How is this treated? This condition is treated with surgery. If you have a strangulated hernia, surgery is done as soon as possible. If your hernia is small and not incarcerated, you may be asked to lose some weight before surgery. Follow these instructions at home:  Follow instructions from your health care provider about eating or drinking restrictions.  If you are overweight, your health care provider may recommend that you increase your activity level and eat a healthier diet.  Do not lift anything that is heavier  than 10 lb (4.5 kg).  Return to your normal activities as told by your health care provider. Ask your health care provider what activities are safe for you. You may need to avoid activities that increase pressure on your hernia.  Take over-the-counter and prescription medicines only as told by your health care provider.  Keep all follow-up visits as told by your health care  provider. This is important. Contact a health care provider if:  Your hernia gets larger.  Your hernia becomes painful. Get help right away if:  Your hernia becomes increasingly painful.  You have pain along with any of the following: ? Changes in skin color in the area of the hernia. ? Nausea. ? Vomiting. ? Fever. Summary  A ventral hernia is a bulge of tissue from inside the abdomen that pushes through a weak area of the muscles that form the front wall of the abdomen.  This condition is treated with surgery, which may be urgent depending on your hernia.  Do not lift anything that is heavier than 10 lb (4.5 kg), and follow activity instructions from your health care provider. This information is not intended to replace advice given to you by your health care provider. Make sure you discuss any questions you have with your health care provider. Document Released: 05/26/2012 Document Revised: 07/22/2017 Document Reviewed: 12/29/2016 Elsevier Interactive Patient Education  2019 [employer].

## 2018-07-30 NOTE — Progress Notes (Signed)
Patient ID: Donna Navarro, female   DOB: Jan 12, 1986, 33 y.o.   MRN: [id number]  HPI Donna Navarro is a 33 y.o. female is pregnant with a 19-week intrauterine pregnancy and she had a cerclage placed at 11 weeks.  She recently presented to the emergency room complaining of abdominal pain and hip pain.  She reports that she has developed a hernia after a previous cholecystectomy.  She stated that she has intermittent mild abdominal pain.  Pain is worsening after Valsalva.  No evidence of incarceration or strangulation. She Did have a CT scan  ( personally reviewed) on August 2019 showing evidence of of ventral hernia.  Measures 13 x 8 x 11 cm. She is able to perform more than 4 METS of activity without any shortness of breath or chest pain.  She denies any previous ventral hernia repairs in the past.  CBC and CMP are completely unremarkable there.reactive increase in the white count likely related to pregnancy.Marland Kitchen   HPI  Past Medical History:  Diagnosis Date  . GERD (gastroesophageal reflux disease)    with pregnancy-no meds  . Hypertension    lost weight and pcp took pt off bp meds 7 months  . Irregular periods   . Morbid obesity (HCC) 04/30/2017  . Ventral hernia without obstruction or gangrene 04/30/2017    Past Surgical History:  Procedure Laterality Date  . CERVICAL CERCLAGE N/A 06/17/2018   Procedure: CERCLAGE CERVICAL;  Surgeon: Natale Milch, MD;  Location: [facility] ORS;  Service: Gynecology;  Laterality: N/A;  . CHOLECYSTECTOMY      Family History  Problem Relation Age of Onset  . Heart disease Mother        Massive Heart Attack    Social History Social History   Tobacco Use  . Smoking status: Never Smoker  . Smokeless tobacco: Never Used  Substance Use Topics  . Alcohol use: Never    Frequency: Never  . Drug use: Never    No Known Allergies  Current Outpatient Medications  Medication Sig Dispense Refill  . prenatal vitamin w/FE, FA (NATACHEW) 29-1 MG  CHEW chewable tablet Chew 1 tablet by mouth daily.      No current facility-administered medications for this visit.      Review of Systems Full ROS  was asked and was negative except for the information on the HPI  Physical Exam Blood pressure (!) 144/88, pulse 92, temperature 97.7 F (36.5 C), temperature source Temporal, height 5\' 8"  (1.727 m), weight (!) 300 lb 12.8 oz (136.4 kg), last menstrual period 03/16/2018, SpO2 99 %. CONSTITUTIONAL: obese female in NAD EYES: Pupils are equal, round, and reactive to light, Sclera are non-icteric. EARS, NOSE, MOUTH AND THROAT: The oropharynx is clear. The oral mucosa is pink and moist. Hearing is intact to voice. LYMPH NODES:  Lymph nodes in the neck are normal. RESPIRATORY:  Lungs are clear. There is normal respiratory effort, with equal breath sounds bilaterally, and without pathologic use of accessory muscles. CARDIOVASCULAR: Heart is regular without murmurs, gallops, or rubs.  GI: The abdomen is  Soft Large reducible ventral hernia. Intrauterine pregnancy. No masses , no peritonitis There are no palpable masses. There is no hepatosplenomegaly. There are normal bowel sounds in all quadrants. GU: Rectal deferred.   MUSCULOSKELETAL: Normal muscle strength and tone. No cyanosis or edema.   SKIN: Turgor is good and there are no pathologic skin lesions or ulcers. NEUROLOGIC: Motor and sensation is grossly normal. Cranial nerves are grossly intact. PSYCH:  Oriented to person, place and time. Affect is normal.  Data Reviewed  I have personally reviewed the patient's imaging, laboratory findings and medical records.    Assessment/Plan 33 year old female with an 19-week viable pregnancy and a large ventral hernia.  Currently there is no evidence of incarceration or strangulation. I have discussed with the patient about her situation in detail.  Given that this is an elective setting I think is in her best interest to wait until she has delivered.   More importantly I do think that we will definitely benefit from weight reduction.  Her BMI is 45 and this might be due to her pregnancy but I will definitely will like to wait until her BMI is better controlled before proceeding with elective repair.  I definitely think that this will need to be addressed in the near future.  An extensive discussion with the patient and the family. We will follow her up in 5 to 6 months after she delivers Time spent with the patient was 45 minutes, with more than 50% of the time spent in face-to-face education, counseling and care coordination.     Sterling Big, MD FACS General Surgeon 07/30/2018, 12:34 PM

## 2018-08-04 NOTE — Telephone Encounter (Signed)
Pt seen in ED 07/13/18.

## 2018-08-09 ENCOUNTER — Ambulatory Visit (INDEPENDENT_AMBULATORY_CARE_PROVIDER_SITE_OTHER): Payer: Medicaid Other

## 2018-08-09 ENCOUNTER — Ambulatory Visit (INDEPENDENT_AMBULATORY_CARE_PROVIDER_SITE_OTHER): Payer: Medicaid Other | Admitting: Obstetrics & Gynecology

## 2018-08-09 VITALS — BP 120/80 | Wt 294.0 lb

## 2018-08-09 DIAGNOSIS — Z3A20 20 weeks gestation of pregnancy: Secondary | ICD-10-CM

## 2018-08-09 DIAGNOSIS — Z363 Encounter for antenatal screening for malformations: Secondary | ICD-10-CM | POA: Diagnosis not present

## 2018-08-09 DIAGNOSIS — O99212 Obesity complicating pregnancy, second trimester: Secondary | ICD-10-CM

## 2018-08-09 DIAGNOSIS — O3432 Maternal care for cervical incompetence, second trimester: Secondary | ICD-10-CM

## 2018-08-09 DIAGNOSIS — O0990 Supervision of high risk pregnancy, unspecified, unspecified trimester: Secondary | ICD-10-CM

## 2018-08-09 DIAGNOSIS — O99213 Obesity complicating pregnancy, third trimester: Secondary | ICD-10-CM | POA: Insufficient documentation

## 2018-08-09 DIAGNOSIS — Z6841 Body Mass Index (BMI) 40.0 and over, adult: Secondary | ICD-10-CM

## 2018-08-09 DIAGNOSIS — O3430 Maternal care for cervical incompetence, unspecified trimester: Secondary | ICD-10-CM

## 2018-08-09 HISTORY — DX: Obesity complicating pregnancy, second trimester: O99.212

## 2018-08-09 NOTE — Patient Instructions (Signed)

## 2018-08-09 NOTE — Progress Notes (Signed)
  Subjective  Fetal Movement? yes Contractions? no Leaking Fluid? no Vaginal Bleeding? no Objective  BP 120/80   Wt 294 lb (133.4 kg)   LMP 03/16/2018 (Approximate)   BMI 44.70 kg/m  General: NAD Pumonary: no increased work of breathing Abdomen: gravid, non-tender Extremities: no edema Psychiatric: mood appropriate, affect full  Assessment  33 y.o. [id number] at [redacted]w[redacted]d by  12/22/2018, Alternate EDD Entry presenting for routine prenatal visit  Plan    Cervical cerclage suture present, antepartum; due to prior early delivery 23 weeks    [place] normal today       Cervix 4 cm, no funneling    Repeat cervical check by exam 22 weeks, 24 weeks (by exam or [place] then)       If normal at that point, then routine care until time for cerclage removal     High-risk pregnancy supervision, unspecified trimester    Obesity RF discussed    Baby ASA daily    Growth Korea and APT    Weight management discussed     [redacted] weeks gestation of pregnancy    PNV     Review of ULTRASOUND. I have personally reviewed images and report of recent ultrasound done at [facility]. There is a singleton gestation with subjectively normal amniotic fluid volume. The fetal biometry correlates with established dating. Detailed evaluation of the fetal anatomy was performed.The fetal anatomical survey appears within normal limits within the resolution of ultrasound as described above.  It must be noted that a normal ultrasound is unable to rule out fetal aneuploidy.     Clinic [facility] Prenatal Labs  Dating  T1 [place] Blood type: --/--/A POS (12/23 1305)   Genetic Screen NIPS:   Normal XX Antibody:NEG (12/23 1305)  Anatomic [place] WS, normal 07/2018 Rubella: 2.87 (12/20) Varicella: Imm  GTT Early: hgba1c 5.4       28 wk:      RPR: Reactive (12/20 1636)  Treatment at [facility]  Rhogam  not needed HBsAg: Negative (12/20 1636)   TDaP vaccine    Third trimester    HIV: Non Reactive (12/20 1636)   Flu Shot    Declines                             GBS: pending 36 weeks  Contraception  BTL- [ ]  consents Pap: NIL 2019  CBB  No   CS/VBAC na   Baby Food  bottle      Annamarie Major, MD, [facility], [facility] 08/09/2018  10:53 AM

## 2018-08-17 ENCOUNTER — Other Ambulatory Visit: Payer: Self-pay

## 2018-08-17 ENCOUNTER — Observation Stay
Admission: EM | Admit: 2018-08-17 | Discharge: 2018-08-17 | Disposition: A | Discharge status: Home / Self Care | Payer: Medicaid Other | Attending: Obstetrics & Gynecology | Admitting: Obstetrics & Gynecology

## 2018-08-17 ENCOUNTER — Encounter: Payer: Self-pay | Admitting: Emergency Medicine

## 2018-08-17 DIAGNOSIS — Z3A21 21 weeks gestation of pregnancy: Secondary | ICD-10-CM | POA: Insufficient documentation

## 2018-08-17 DIAGNOSIS — Z9889 Other specified postprocedural states: Secondary | ICD-10-CM

## 2018-08-17 DIAGNOSIS — O09299 Supervision of pregnancy with other poor reproductive or obstetric history, unspecified trimester: Secondary | ICD-10-CM

## 2018-08-17 DIAGNOSIS — O0990 Supervision of high risk pregnancy, unspecified, unspecified trimester: Secondary | ICD-10-CM

## 2018-08-17 DIAGNOSIS — O2342 Unspecified infection of urinary tract in pregnancy, second trimester: Principal | ICD-10-CM | POA: Insufficient documentation

## 2018-08-17 DIAGNOSIS — M545 Low back pain, unspecified: Secondary | ICD-10-CM

## 2018-08-17 DIAGNOSIS — Z3A22 22 weeks gestation of pregnancy: Secondary | ICD-10-CM

## 2018-08-17 DIAGNOSIS — N883 Incompetence of cervix uteri: Secondary | ICD-10-CM

## 2018-08-17 LAB — URINALYSIS, COMPLETE (UACMP) WITH MICROSCOPIC
Bilirubin Urine: NEGATIVE
Glucose, UA: NEGATIVE mg/dL
KETONES UR: NEGATIVE mg/dL
Nitrite: NEGATIVE
PROTEIN: NEGATIVE mg/dL
Specific Gravity, Urine: 1.024 (ref 1.005–1.030)
pH: 6 (ref 5.0–8.0)

## 2018-08-17 MED ORDER — CEPHALEXIN 500 MG PO CAPS: 500.0000 mg | ORAL_CAPSULE | Freq: Two times a day (BID) | ORAL | 0 refills | Status: AC

## 2018-08-17 NOTE — Discharge Summary (Signed)
See Final Progress Note 08/17/2018.

## 2018-08-17 NOTE — Discharge Instructions (Signed)

## 2018-08-17 NOTE — ED Triage Notes (Signed)
22 weeks preg ( high risk), rectal pressure/cramping pain radiating to right leg since Friday.   Dosing with tylenol with no relief.  Denies vaginal pressure, bleeding.  "baby moving normally"

## 2018-08-17 NOTE — OB Triage Note (Signed)
Pt. presents to L&D with lower back pain since Friday, rating 5-6/10.  She reports that Tylenol and other non-pharmacological comfort measures are no longer effective.  Denies urinary frequency, urgency, or pain.  Denies pain radiating to front of abdomen.  Denies vaginal bleeding, LOF.  Positive for fetal movement.  Pt. has a history of cervical incompetence & placement of cerclages.  She has two living children + one baby stillborn.  She also has a significant & sizable hernia in her mid-upper abdomen.

## 2018-08-17 NOTE — ED Notes (Signed)
Pt states she is [redacted] weeks pregnant , high risk and is having intermittent pelvic pressure and rectal pain with lower back cramping since Friday that has progressed. States she has been taking tylenol for pain. Denies any vaginal bleeding or discharge.

## 2018-08-17 NOTE — ED Provider Notes (Signed)
[facility] Emergency Department Provider Note    First MD Initiated Contact with Patient 08/17/18 [phone]     (approximate)  I have reviewed the triage vital signs and the nursing notes.   HISTORY  Chief Complaint Leg Pain    HPI Donna Navarro is a 33 y.o. female who is roughly [redacted] weeks pregnant with a high risk pregnancy status post cerclage placement presents the ER for low back pain.  States that she needed to use the bathroom yesterday but did not actually move her bowels.  States that she is been having irregular bowel movements for the past several weeks is not on any stool softeners.  Denies any vaginal bleeding.  Still feel the baby move like normal.  Denies any dysuria.  No fevers.  No numbness or tingling.    Past Medical History:  Diagnosis Date  . GERD (gastroesophageal reflux disease)    with pregnancy-no meds  . Hypertension    lost weight and pcp took pt off bp meds 7 months  . Irregular periods   . Morbid obesity (HCC) 04/30/2017  . Ventral hernia without obstruction or gangrene 04/30/2017   Family History  Problem Relation Age of Onset  . Heart disease Mother        Massive Heart Attack   Past Surgical History:  Procedure Laterality Date  . CERVICAL CERCLAGE N/A 06/17/2018   Procedure: CERCLAGE CERVICAL;  Surgeon: Natale Milch, MD;  Location: [facility] ORS;  Service: Gynecology;  Laterality: N/A;  . CHOLECYSTECTOMY     Patient Active Problem List   Diagnosis Date Noted  . Obesity affecting pregnancy in second trimester 08/09/2018  . Morbid obesity with BMI of 40.0-44.9, adult (HCC) 08/09/2018  . Cervical cerclage suture present, antepartum 06/28/2018  . High-risk pregnancy supervision, unspecified trimester 05/17/2018  . Cervical incompetence 05/17/2018  . Hx of cerclage, currently pregnant 05/17/2018  . 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 Ventral hernia without obstruction or gangrene 04/30/2017  . Morbid obesity (HCC)  04/30/2017      Prior to Admission medications   Medication Sig Start Date End Date Taking? Authorizing Provider  prenatal vitamin w/FE, FA (NATACHEW) 29-1 MG CHEW chewable tablet Chew 1 tablet by mouth daily.     [provider]    Allergies Patient has no known allergies.    Social History Social History   Tobacco Use  . Smoking status: Never Smoker  . Smokeless tobacco: Never Used  Substance Use Topics  . Alcohol use: Never    Frequency: Never  . Drug use: Never    Review of Systems Patient denies headaches, rhinorrhea, blurry vision, numbness, shortness of breath, chest pain, edema, cough, abdominal pain, nausea, vomiting, diarrhea, dysuria, fevers, rashes or hallucinations unless otherwise stated above in HPI. ____________________________________________   PHYSICAL EXAM:  VITAL SIGNS: Vitals:   08/17/18 0830  BP: (!) 150/77  Pulse: 94  Resp: 18  Temp: 98.3 F (36.8 C)  SpO2: 99%    Constitutional: Alert and oriented.  Eyes: Conjunctivae are normal.  Head: Atraumatic. Nose: No congestion/rhinnorhea. Mouth/Throat: Mucous membranes are moist.   Neck: No stridor. Painless ROM.  Cardiovascular: Normal rate, regular rhythm. Grossly normal heart sounds.  Good peripheral circulation. Respiratory: Normal respiratory effort.  No retractions. Lungs CTAB. Gastrointestinal: Soft, gravid, nontender reducible ventral hernia no distention. No abdominal bruits. No CVA tenderness. Genitourinary: Normal external genitalia no evidence of thrombosed hemorrhoid or mass.  No saddle anesthesia Musculoskeletal: No lower extremity tenderness nor edema.  No joint effusions.  Reflexes are equal bilaterally.  No neurovascular deficits. Neurologic:  Normal speech and language. No gross focal neurologic deficits are appreciated. No facial droop Skin:  Skin is warm, dry and intact. No rash noted. Psychiatric: Mood and affect are normal. Speech and behavior are  normal.  ____________________________________________   LABS (all labs ordered are listed, but only abnormal results are displayed)  No results found for this or any previous visit (from the past 24 hour(s)). ____________________________________________  EKG____________________________________________  ____________________________________________   PROCEDURES  Procedure(s) performed:  Procedures    Critical Care performed: no ____________________________________________   INITIAL IMPRESSION / ASSESSMENT AND PLAN / ED COURSE  Pertinent labs & imaging results that were available during my care of the patient were reviewed by me and considered in my medical decision making (see chart for details).   DDX: Sacroiliitis, constipation, hemorrhoids, labor  Donna Navarro is a 33 y.o. who presents to the ED with symptoms as described above.  Exam is reassuring.  No focal neuro deficits.  Afebrile and hemodynamically stable.  Does not appear to be at risk for imminent delivery.  This point I do believe she is appropriate and stable for transfer to labor and delivery evaluation.      As part of my medical decision making, I reviewed the following data within the electronic MEDICAL RECORD NUMBER Nursing notes reviewed and incorporated, Labs reviewed, notes from prior ED visits and [place] Controlled Substance Database   ____________________________________________   FINAL CLINICAL IMPRESSION(S) / ED DIAGNOSES  Final diagnoses:  Acute midline low back pain without sciatica  [redacted] weeks gestation of pregnancy      NEW MEDICATIONS STARTED DURING THIS VISIT:  New Prescriptions   No medications on file     Note:  This document was prepared using Dragon voice recognition software and may include unintentional dictation errors.    Willy Eddy, MD 08/17/18 [phone]

## 2018-08-17 NOTE — Final Progress Note (Signed)
Physician Final Progress Note  Patient ID: Donna Navarro MRN: [id number] DOB/AGE: 35-Dec-1987 35 y.o.  Admit date: 08/17/2018 Admitting provider: Nadara Mustard, MD Discharge date: 08/17/2018  Admission Diagnoses:  Indication for care in labor and delivery, antepartum  Discharge Diagnoses: Urinary tract infection  History of Present Illness: The patient is a 33 y.o. female [phone] at [redacted]w[redacted]d who presents for lower back pain that started on Friday. It is bilateral, non-radiating, and she rates it as 5-6/10. She has tried Tylenol and warm baths with no relief. She does not have any dysuria. She denies contractions, loss of fluid, and vaginal bleeding. She has positive fetal movement. She has a cervical cerclage in place. She has a hernia which is not causing her any pain currently.  Review of Systems: Review of systems negative unless otherwise noted in HPI.   Past Medical History:  Diagnosis Date  . GERD (gastroesophageal reflux disease)    with pregnancy-no meds  . Hypertension    lost weight and pcp took pt off bp meds 7 months  . Irregular periods   . Morbid obesity (HCC) 04/30/2017  . Ventral hernia without obstruction or gangrene 04/30/2017    Past Surgical History:  Procedure Laterality Date  . CERVICAL CERCLAGE N/A 06/17/2018   Procedure: CERCLAGE CERVICAL;  Surgeon: Natale Milch, MD;  Location: [facility] ORS;  Service: Gynecology;  Laterality: N/A;  . CHOLECYSTECTOMY      No current facility-administered medications on file prior to encounter.    Current Outpatient Medications on File Prior to Encounter  Medication Sig Dispense Refill  . prenatal vitamin w/FE, FA (NATACHEW) 29-1 MG CHEW chewable tablet Chew 1 tablet by mouth daily.       No Known Allergies  Social History   Socioeconomic History  . Marital status: Married    Spouse name: Not on file  . Number of children: 2  . Years of education: Not on file  . Highest education level: Not on file   Occupational History  . Not on file  Social Needs  . Financial resource strain: Not on file  . Food insecurity:    Worry: Not on file    Inability: Not on file  . Transportation needs:    Medical: Not on file    Non-medical: Not on file  Tobacco Use  . Smoking status: Never Smoker  . Smokeless tobacco: Never Used  Substance and Sexual Activity  . Alcohol use: Never    Frequency: Never  . Drug use: Never  . Sexual activity: Yes    Birth control/protection: None  Lifestyle  . Physical activity:    Days per week: Not on file    Minutes per session: Not on file  . Stress: Not on file  Relationships  . Social connections:    Talks on phone: Not on file    Gets together: Not on file    Attends religious service: Not on file    Active member of club or organization: Not on file    Attends meetings of clubs or organizations: Not on file    Relationship status: Not on file  . Intimate partner violence:    Fear of current or ex partner: Not on file    Emotionally abused: Not on file    Physically abused: Not on file    Forced sexual activity: Not on file  Other Topics Concern  . Not on file  Social History Narrative  . Not on file    Family  history: Negative/unremarkable except as detailed in HPI. No family history of birth defects.   Physical Exam: BP 99/79 (BP Location: Left Arm)   Pulse 86   Temp 98.4 F (36.9 C) (Oral)   Resp 18   Ht 5\' 8"  (1.727 m)   Wt 134.3 kg   LMP 03/16/2018 (Approximate)   SpO2 99%   BMI 45.01 kg/m   Gen: NAD CV: Regular rate Pulm: No increased work of breathing Pelvic: deferred, toco quiet and no signs of preterm labor Ext: no signs of DVT Neuro: Alert and oriented  FHT via Doppler: 155 Continuous tocometry for 2 hours, quiet  Consults: None  Significant Findings/ Diagnostic Studies: labs: UA positive for blood, bacteria, and leukocytes. Urine culture pending.  Procedures: None  Discharge Condition: good  Disposition:  Discharge disposition: 01-Home or Self Care       Diet: Regular diet  Discharge Activity: Activity as tolerated  Discharge Instructions    Discharge activity:  No Restrictions   Complete by:  As directed    Discharge diet:  No restrictions   Complete by:  As directed    No sexual activity restrictions   Complete by:  As directed    Notify physician for a general feeling that "something is not right"   Complete by:  As directed    Notify physician for increase or change in vaginal discharge   Complete by:  As directed    Notify physician for intestinal cramps, with or without diarrhea, sometimes described as "gas pain"   Complete by:  As directed    Notify physician for leaking of fluid   Complete by:  As directed    Notify physician for low, dull backache, unrelieved by heat or Tylenol   Complete by:  As directed    Notify physician for menstrual like cramps   Complete by:  As directed    Notify physician for pelvic pressure   Complete by:  As directed    Notify physician for uterine contractions.  These may be painless and feel like the uterus is tightening or the baby is  "balling up"   Complete by:  As directed    Notify physician for vaginal bleeding   Complete by:  As directed    PRETERM LABOR:  Includes any of the follwing symptoms that occur between 20 - [redacted] weeks gestation.  If these symptoms are not stopped, preterm labor can result in preterm delivery, placing your baby at risk   Complete by:  As directed      Allergies as of 08/17/2018   No Known Allergies     Medication List    TAKE these medications   cephALEXin 500 MG capsule Commonly known as:  KEFLEX Take 1 capsule (500 mg total) by mouth 2 (two) times daily for 7 days.   prenatal vitamin w/FE, FA 29-1 MG Chew chewable tablet Chew 1 tablet by mouth daily.      Follow-up Information    [facility], [place].   Contact information: [address] [place]  [address] [phone]          UA findings support empiric antibiotic therapy for UTI, which is the likely cause of her lower back pain. Rx sent for Keflex 500 mg BID for 7 days.  Will notify if urine culture results require change in therapy. Has follow up OB appointment on 3/2 already scheduled.  Signed: Oswaldo Conroy, CNM  08/17/2018

## 2018-08-18 LAB — URINE CULTURE

## 2018-08-23 ENCOUNTER — Ambulatory Visit (INDEPENDENT_AMBULATORY_CARE_PROVIDER_SITE_OTHER): Payer: Medicaid Other | Admitting: Obstetrics and Gynecology

## 2018-08-23 ENCOUNTER — Encounter: Payer: Self-pay | Admitting: Obstetrics and Gynecology

## 2018-08-23 VITALS — BP 130/84 | Wt 295.0 lb

## 2018-08-23 DIAGNOSIS — O3430 Maternal care for cervical incompetence, unspecified trimester: Secondary | ICD-10-CM

## 2018-08-23 DIAGNOSIS — O09292 Supervision of pregnancy with other poor reproductive or obstetric history, second trimester: Secondary | ICD-10-CM

## 2018-08-23 DIAGNOSIS — Z3A22 22 weeks gestation of pregnancy: Secondary | ICD-10-CM

## 2018-08-23 DIAGNOSIS — Z9889 Other specified postprocedural states: Secondary | ICD-10-CM

## 2018-08-23 DIAGNOSIS — O3432 Maternal care for cervical incompetence, second trimester: Secondary | ICD-10-CM

## 2018-08-23 DIAGNOSIS — O98112 Syphilis complicating pregnancy, second trimester: Secondary | ICD-10-CM

## 2018-08-23 DIAGNOSIS — O0990 Supervision of high risk pregnancy, unspecified, unspecified trimester: Secondary | ICD-10-CM

## 2018-08-23 DIAGNOSIS — O99212 Obesity complicating pregnancy, second trimester: Secondary | ICD-10-CM

## 2018-08-23 DIAGNOSIS — O09299 Supervision of pregnancy with other poor reproductive or obstetric history, unspecified trimester: Secondary | ICD-10-CM

## 2018-08-23 DIAGNOSIS — O98111 Syphilis complicating pregnancy, first trimester: Secondary | ICD-10-CM | POA: Insufficient documentation

## 2018-08-23 DIAGNOSIS — Z6841 Body Mass Index (BMI) 40.0 and over, adult: Secondary | ICD-10-CM

## 2018-08-23 NOTE — Progress Notes (Signed)
Routine Prenatal Care Visit  Subjective  Donna Navarro is a 33 y.o. [id number] at [redacted]w[redacted]d being seen today for ongoing prenatal care.  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  ----------------------------------------------------------------------------------- Patient reports no complaints.   Contractions: Not present. Vag. Bleeding: None.  Movement: Present. Denies leaking of fluid.  ----------------------------------------------------------------------------------- The following portions of the patient's history were reviewed and updated as appropriate: allergies, current medications, past family history, past medical history, past social history, past surgical history and problem list. Problem list updated.   Objective  Blood pressure 130/84, weight 295 lb (133.8 kg), last menstrual period 03/16/2018. Pregravid weight 289 lb (131.1 kg) Total Weight Gain 6 lb (2.722 kg) Urinalysis: Urine Protein    Urine Glucose    Fetal Status: Fetal Heart Rate (bpm): 150   Movement: Present     General:  Alert, oriented and cooperative. Patient is in no acute distress.  Skin: Skin is warm and dry. No rash noted.   Cardiovascular: Normal heart rate noted  Respiratory: Normal respiratory effort, no problems with respiration noted  Abdomen: Soft, gravid, appropriate for gestational age. Pain/Pressure: Absent     Pelvic:  Cervical exam performed Dilation: Closed Effacement (%): 50 Station: -3  Extremities: Normal range of motion.  Edema: None   Mental Status: Normal mood and affect. Normal behavior. Normal judgment and thought content.   Assessment   33 y.o. 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 08/23/2018 by Conard Novak, MD No   Overview Signed 08/23/2018 12:07 PM by Conard Novak, MD    Treated at [facility] - retest at 28 weeks (RPR)      High-risk pregnancy supervision, unspecified trimester 05/17/2018 by Natale Milch, MD No   Overview Addendum 07/26/2018  5:11 PM by Natale Milch, MD      Clinic [facility] Prenatal Labs  Dating  T1 [place] Blood type: --/--/A POS (12/23 1305)   Genetic Screen NIPS:   Normal XX Antibody:NEG (12/23 1305)  Anatomic [place]  Rubella: 2.87 (12/20 1636) Varicella: Immune  GTT Early: hgba1c 5.4       28 wk:      RPR: Reactive (12/20 1636)  Treatment at [facility]  Rhogam  not needed HBsAg: Negative (12/20 1636)   TDaP vaccine                       HIV: Non Reactive (12/20 1636)   Flu Shot    Declines                            GBS:   Contraception  BTL- [ ]  consents Pap: NIL 2019  CBB     CS/VBAC    Baby Food  bottle   Support Person   significant other           Cervical incompetence 05/17/2018 by Natale Milch, MD No   Hx of cerclage,  currently pregnant 05/17/2018 by Natale Milch, MD No   Overview Addendum 06/28/2018  4:16 PM by Oswaldo Conroy, CNM    - cerclage after [redacted] weeks gestation [x]            Preterm labor symptoms and general obstetric precautions including but not limited to vaginal bleeding, contractions, leaking of fluid and fetal movement were reviewed in detail with the patient. Please refer to After Visit Summary for other counseling recommendations.   - cervical exam reassuring today. Recheck in 2 weeks. - Needs RPR at 28 weeks with +syphilis this  pregnancy.   Return in about 2 weeks (around 09/06/2018) for Routine Prenatal Appointment/Cvx check.  Thomasene Mohair, MD, [facility], [facility] 08/23/2018 12:07 PM

## 2018-09-06 ENCOUNTER — Ambulatory Visit (INDEPENDENT_AMBULATORY_CARE_PROVIDER_SITE_OTHER): Payer: Medicaid Other | Admitting: Obstetrics & Gynecology

## 2018-09-06 ENCOUNTER — Other Ambulatory Visit: Payer: Self-pay

## 2018-09-06 ENCOUNTER — Encounter: Payer: Self-pay | Admitting: Obstetrics & Gynecology

## 2018-09-06 VITALS — BP 120/80 | Wt 295.0 lb

## 2018-09-06 DIAGNOSIS — O0990 Supervision of high risk pregnancy, unspecified, unspecified trimester: Secondary | ICD-10-CM

## 2018-09-06 DIAGNOSIS — O98112 Syphilis complicating pregnancy, second trimester: Secondary | ICD-10-CM

## 2018-09-06 DIAGNOSIS — Z9889 Other specified postprocedural states: Secondary | ICD-10-CM

## 2018-09-06 DIAGNOSIS — O99212 Obesity complicating pregnancy, second trimester: Secondary | ICD-10-CM

## 2018-09-06 DIAGNOSIS — Z3A24 24 weeks gestation of pregnancy: Secondary | ICD-10-CM

## 2018-09-06 DIAGNOSIS — O09292 Supervision of pregnancy with other poor reproductive or obstetric history, second trimester: Secondary | ICD-10-CM

## 2018-09-06 DIAGNOSIS — Z6841 Body Mass Index (BMI) 40.0 and over, adult: Secondary | ICD-10-CM

## 2018-09-06 DIAGNOSIS — O09299 Supervision of pregnancy with other poor reproductive or obstetric history, unspecified trimester: Secondary | ICD-10-CM

## 2018-09-06 NOTE — Progress Notes (Signed)
  Subjective  Fetal Movement? yes Contractions? no Leaking Fluid? no Vaginal Bleeding? no  Objective  BP 120/80   Wt 295 lb (133.8 kg)   LMP 03/16/2018 (Approximate)   BMI 44.85 kg/m  General: NAD Pumonary: no increased work of breathing Abdomen: gravid, non-tender Extremities: no edema Psychiatric: mood appropriate, affect full SVE 0/50/ballotable Assessment  33 y.o. 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

## 2018-09-08 ENCOUNTER — Encounter: Payer: Self-pay | Admitting: Emergency Medicine

## 2018-09-08 ENCOUNTER — Emergency Department
Admission: EM | Admit: 2018-09-08 | Discharge: 2018-09-08 | Disposition: A | Discharge status: Home / Self Care | Payer: Medicaid Other | Attending: Emergency Medicine | Admitting: Emergency Medicine

## 2018-09-08 ENCOUNTER — Other Ambulatory Visit: Payer: Self-pay

## 2018-09-08 DIAGNOSIS — Z3A25 25 weeks gestation of pregnancy: Secondary | ICD-10-CM | POA: Insufficient documentation

## 2018-09-08 DIAGNOSIS — O10012 Pre-existing essential hypertension complicating pregnancy, second trimester: Secondary | ICD-10-CM | POA: Insufficient documentation

## 2018-09-08 DIAGNOSIS — Z79899 Other long term (current) drug therapy: Secondary | ICD-10-CM | POA: Diagnosis not present

## 2018-09-08 DIAGNOSIS — O9989 Other specified diseases and conditions complicating pregnancy, childbirth and the puerperium: Secondary | ICD-10-CM | POA: Insufficient documentation

## 2018-09-08 DIAGNOSIS — R109 Unspecified abdominal pain: Secondary | ICD-10-CM

## 2018-09-08 DIAGNOSIS — K439 Ventral hernia without obstruction or gangrene: Secondary | ICD-10-CM | POA: Diagnosis not present

## 2018-09-08 LAB — CBC WITH DIFFERENTIAL/PLATELET
ABS IMMATURE GRANULOCYTES: 0.2 10*3/uL — AB (ref 0.00–0.07)
Basophils Absolute: 0.1 10*3/uL (ref 0.0–0.1)
Basophils Relative: 0 %
Eosinophils Absolute: 0.1 10*3/uL (ref 0.0–0.5)
Eosinophils Relative: 0 %
HCT: 37.2 % (ref 36.0–46.0)
Hemoglobin: 11.9 g/dL — ABNORMAL LOW (ref 12.0–15.0)
Immature Granulocytes: 1 %
LYMPHS PCT: 13 %
Lymphs Abs: 2.9 10*3/uL (ref 0.7–4.0)
MCH: 28.5 pg (ref 26.0–34.0)
MCHC: 32 g/dL (ref 30.0–36.0)
MCV: 89 fL (ref 80.0–100.0)
Monocytes Absolute: 0.9 10*3/uL (ref 0.1–1.0)
Monocytes Relative: 4 %
Neutro Abs: 17.7 10*3/uL — ABNORMAL HIGH (ref 1.7–7.7)
Neutrophils Relative %: 82 %
Platelets: 310 10*3/uL (ref 150–400)
RBC: 4.18 MIL/uL (ref 3.87–5.11)
RDW: 13.7 % (ref 11.5–15.5)
WBC: 21.8 10*3/uL — ABNORMAL HIGH (ref 4.0–10.5)
nRBC: 0 % (ref 0.0–0.2)

## 2018-09-08 LAB — COMPREHENSIVE METABOLIC PANEL
ALT: 11 U/L (ref 0–44)
AST: 14 U/L — ABNORMAL LOW (ref 15–41)
Albumin: 3.1 g/dL — ABNORMAL LOW (ref 3.5–5.0)
Alkaline Phosphatase: 84 U/L (ref 38–126)
Anion gap: 10 (ref 5–15)
BUN: 10 mg/dL (ref 6–20)
CO2: 21 mmol/L — ABNORMAL LOW (ref 22–32)
Calcium: 8.7 mg/dL — ABNORMAL LOW (ref 8.9–10.3)
Chloride: 106 mmol/L (ref 98–111)
Creatinine, Ser: 0.63 mg/dL (ref 0.44–1.00)
GFR calc Af Amer: >60 mL/min (ref >60)
GFR calc non Af Amer: >60 mL/min (ref >60)
Glucose, Bld: 134 mg/dL — ABNORMAL HIGH (ref 70–99)
Potassium: 3.3 mmol/L — ABNORMAL LOW (ref 3.5–5.1)
Sodium: 137 mmol/L (ref 135–145)
Total Bilirubin: 0.3 mg/dL (ref 0.3–1.2)
Total Protein: 6.8 g/dL (ref 6.5–8.1)

## 2018-09-08 MED ORDER — SUCRALFATE 1 G PO TABS: 1.0000 g | ORAL_TABLET | Freq: Four times a day (QID) | ORAL | 0 refills | Status: DC

## 2018-09-08 NOTE — ED Triage Notes (Signed)
Pt arrives with complaints of hernia pain. PT reports pain started "a couple of days ago."

## 2018-09-08 NOTE — ED Provider Notes (Signed)
[facility] Emergency Department Provider Note  ____________________________________________   I have reviewed the triage vital signs and the nursing notes.   HISTORY  Chief Complaint Hernia   History limited by: Not Limited   HPI Donna Navarro is a 33 y.o. female who presents to the emergency department today because of concern for worsening pain around her hernia. She states that she has a history of a ventral hernia and has felt like it has been getting worse during her pregnancy. She is roughly [redacted] weeks pregnant. She states that it now is starting to hurt when she gets up and walks. Pain is located in the epigastric and right upper quadrant. She will feel a bulge when she stands up but has been able to reduce it without any issue. She describes the pain as burning. She has had nausea throughout this pregnancy but it has not gotten any worse. She has also been having    Records reviewed. Per medical record review patient has a history of GERD, HTN.   Past Medical History:  Diagnosis Date  . GERD (gastroesophageal reflux disease)    with pregnancy-no meds  . Hypertension    lost weight and pcp took pt off bp meds 7 months  . Irregular periods   . Morbid obesity (HCC) 04/30/2017  . Ventral hernia without obstruction or gangrene 04/30/2017    Patient Active Problem List   Diagnosis Date Noted  . 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 Obesity affecting pregnancy in second trimester 08/09/2018  . Morbid obesity with BMI of 40.0-44.9, adult (HCC) 08/09/2018  . Cervical cerclage suture present, antepartum 06/28/2018  . High-risk pregnancy supervision, unspecified trimester 05/17/2018  . Cervical incompetence 05/17/2018  . Hx of cerclage, currently pregnant 05/17/2018  . 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 Ventral hernia without obstruction or gangrene 04/30/2017  . Morbid obesity (HCC) 04/30/2017    Past Surgical History:  Procedure  Laterality Date  . CERVICAL CERCLAGE N/A 06/17/2018   Procedure: CERCLAGE CERVICAL;  Surgeon: Natale Milch, MD;  Location: [facility] ORS;  Service: Gynecology;  Laterality: N/A;  . CHOLECYSTECTOMY      Prior to Admission medications   Medication Sig Start Date End Date Taking? Authorizing Provider  prenatal vitamin w/FE, FA (NATACHEW) 29-1 MG CHEW chewable tablet Chew 1 tablet by mouth daily.     [provider]    Allergies Patient has no known allergies.  Family History  Problem Relation Age of Onset  . Heart disease Mother        Massive Heart Attack    Social History Social History   Tobacco Use  . Smoking status: Never Smoker  . Smokeless tobacco: Never Used  Substance Use Topics  . Alcohol use: Never    Frequency: Never  . Drug use: Never    Review of Systems Constitutional: No fever/chills Eyes: No visual changes. ENT: No sore throat. Cardiovascular: Denies chest pain. Respiratory: Denies shortness of breath. Gastrointestinal: Positive for abdominal pain, positive for nausea.  Genitourinary: Negative for dysuria. Musculoskeletal: Negative for back pain. Skin: Negative for rash. Neurological: Negative for headaches, focal weakness or numbness.  ____________________________________________   PHYSICAL EXAM:  VITAL SIGNS: ED Triage Vitals  Enc Vitals Group     BP 09/08/18 1505 133/68     Pulse Rate 09/08/18 1505 (!) 104     Resp 09/08/18 1505 18     Temp 09/08/18 1505 97.9 F (36.6 C)     Temp Source 09/08/18  1505 Oral     SpO2 09/08/18 1505 98 %     Weight 09/08/18 1505 295 lb (133.8 kg)     Height 09/08/18 1505 5\' 7"  (1.702 m)     Head Circumference --      Peak Flow --      Pain Score 09/08/18 1504 4   Constitutional: Alert and oriented.  Eyes: Conjunctivae are normal.  ENT      Head: Normocephalic and atraumatic.      Nose: No congestion/rhinnorhea.      Mouth/Throat: Mucous membranes are moist.      Neck: No  stridor. Hematological/Lymphatic/Immunilogical: No cervical lymphadenopathy. Cardiovascular: Normal rate, regular rhythm.  No murmurs, rubs, or gallops. Respiratory: Normal respiratory effort without tachypnea nor retractions. Breath sounds are clear and equal bilaterally. No wheezes/rales/rhonchi. Gastrointestinal: Soft and minimally tender. Gravid. Genitourinary: Deferred Musculoskeletal: Normal range of motion in all extremities. No lower extremity edema. Neurologic:  Normal speech and language. No gross focal neurologic deficits are appreciated.  Skin:  Skin is warm, dry and intact. No rash noted. Psychiatric: Mood and affect are normal. Speech and behavior are normal. Patient exhibits appropriate insight and judgment.  ____________________________________________    LABS (pertinent positives/negatives)  CBC wbc 21.8, hgb 11.9, plt 310 CMP na 137, k 3.3, glu 134, cr 0.63  ____________________________________________   EKG  None  ____________________________________________    RADIOLOGY  None  ____________________________________________   PROCEDURES  Procedures  ____________________________________________   INITIAL IMPRESSION / ASSESSMENT AND PLAN / ED COURSE  Pertinent labs & imaging results that were available during my care of the patient were reviewed by me and considered in my medical decision making (see chart for details).   Patient presented to the emergency department today because of concern for worsening pain associated with a hernia. At this time doubt strangulation or incarceration given the fact she is able to reduce it and has not noticed any worsening in nausea or vomiting. Patient did have a leukocytosis in her blood work however this appears to be normal for the patient. The patient is status post cholecystectomy. At this time doubt other significant abdominal infection. Did discuss return precautions with the patient including worsening nausea,  vomiting fevers.  ____________________________________________   FINAL CLINICAL IMPRESSION(S) / ED DIAGNOSES  Final diagnoses:  Abdominal pain, unspecified abdominal location  Ventral hernia without obstruction or gangrene     Note: This dictation was prepared with Dragon dictation. Any transcriptional errors that result from this process are unintentional     Phineas Semen, MD 09/08/18 [phone]

## 2018-09-08 NOTE — Discharge Instructions (Addendum)
Please seek medical attention for any high fevers, chest pain, shortness of breath, change in behavior, persistent vomiting, bloody stool or any other new or concerning symptoms.  

## 2018-09-20 ENCOUNTER — Ambulatory Visit (INDEPENDENT_AMBULATORY_CARE_PROVIDER_SITE_OTHER): Payer: Medicaid Other | Admitting: Obstetrics and Gynecology

## 2018-09-20 ENCOUNTER — Other Ambulatory Visit: Payer: Self-pay

## 2018-09-20 ENCOUNTER — Encounter: Payer: Self-pay | Admitting: Obstetrics and Gynecology

## 2018-09-20 DIAGNOSIS — O98112 Syphilis complicating pregnancy, second trimester: Secondary | ICD-10-CM | POA: Diagnosis not present

## 2018-09-20 DIAGNOSIS — O0990 Supervision of high risk pregnancy, unspecified, unspecified trimester: Secondary | ICD-10-CM

## 2018-09-20 DIAGNOSIS — O99212 Obesity complicating pregnancy, second trimester: Secondary | ICD-10-CM

## 2018-09-20 DIAGNOSIS — Z9889 Other specified postprocedural states: Secondary | ICD-10-CM

## 2018-09-20 DIAGNOSIS — O3432 Maternal care for cervical incompetence, second trimester: Secondary | ICD-10-CM

## 2018-09-20 DIAGNOSIS — Z3A26 26 weeks gestation of pregnancy: Secondary | ICD-10-CM

## 2018-09-20 DIAGNOSIS — O09292 Supervision of pregnancy with other poor reproductive or obstetric history, second trimester: Secondary | ICD-10-CM | POA: Diagnosis not present

## 2018-09-20 DIAGNOSIS — O09299 Supervision of pregnancy with other poor reproductive or obstetric history, unspecified trimester: Secondary | ICD-10-CM

## 2018-09-20 DIAGNOSIS — O3430 Maternal care for cervical incompetence, unspecified trimester: Secondary | ICD-10-CM

## 2018-09-20 DIAGNOSIS — Z6841 Body Mass Index (BMI) 40.0 and over, adult: Secondary | ICD-10-CM

## 2018-09-20 DIAGNOSIS — O98111 Syphilis complicating pregnancy, first trimester: Secondary | ICD-10-CM

## 2018-09-20 NOTE — Progress Notes (Signed)
Routine Prenatal Care Visit- Virtual Visit  Subjective   Virtual Visit via Telephone Note  I connected with@ on 09/20/18 at 11:30 AM EDT by telephone and verified that I am speaking with the correct person using two identifiers.   I discussed the limitations, risks, security and privacy concerns of performing an evaluation and management service by telephone and the availability of in person appointments. I also discussed with the patient that there may be a patient responsible charge related to this service. The patient expressed understanding and agreed to proceed.  The patient was at home I spoke with the patient from my  office The names of people involved in this encounter were: Diona Foley.   Donna Navarro is a 33 y.o. [phone] at [redacted]w[redacted]d being seen today for ongoing prenatal care.  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  ----------------------------------------------------------------------------------- Patient reports no complaints. Feeling well. Has mostly been staying home, but has gone to church. Continues to have issues with hernia, but general surgery is delaying operation. Discussed PBTL not being offered, she is okay with this and would like Nexplanon, IUD, or depo instead.   Does not have scale or BP machine at home. Discussed COVID-19 PNC changes and prevention. Contractions: Not present. Vag. Bleeding: None.  Movement: Present. Denies leaking of fluid.   ----------------------------------------------------------------------------------- The following portions of the patient's history were reviewed and updated as appropriate: allergies, current medications, past family history, past medical history, past social history, past surgical history and problem list. Problem list updated.   Objective  Last menstrual period 03/16/2018. Pregravid weight 289 lb (131.1 kg) Total Weight Gain 6 lb (2.722 kg) Urinalysis:      Fetal Status:     Movement: Present     Physical Exam could not be performed. Because of the COVID-19 outbreak this visit was performed over the phone and not in person.   Assessment   33 y.o. 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 08/23/2018 by Conard Novak, MD No   Overview Signed 08/23/2018 12:07 PM by Conard Novak, MD    Treated at [facility] - retest at 28 weeks (RPR)      High-risk pregnancy supervision, unspecified trimester 05/17/2018 by Natale Milch, MD No   Overview Addendum 09/20/2018 12:35 PM by Natale Milch, MD      [facility]inic [facility] Prenatal Labs  Dating  T1 [place] Blood type: --/--/A POS (12/23 1305)   Genetic Screen NIPS:   Normal XX Antibody:NEG (12/23 1305)  Anatomic [place] complete Rubella: 2.87 (12/20 1636)  Varicella: Immune  GTT Early: hgba1c 5.4       28 wk:      RPR: Reactive (12/20 1636)  Treatment at [facility]  Rhogam  not needed HBsAg: Negative (12/20 1636)   TDaP vaccine                       HIV: Non Reactive (12/20 1636)  Flu Shot    Declines                            GBS:   Contraception  BTL- [ ]  consents Okay with IUD or Nexplanon after birth Pap: NIL 2019  CBB     CS/VBAC  not needed   Baby Food  bottle   Support Person   significant other- [name]           Cervical incompetence  05/17/2018 by Natale Milch, MD No   Hx of cerclage, currently pregnant 05/17/2018 by Natale Milch, MD No   Overview Addendum 06/28/2018  4:16 PM by Oswaldo Conroy, CNM    - cerclage after [redacted] weeks gestation [x]            Gestational age appropriate obstetric precautions including but not limited to vaginal bleeding, contractions, leaking of fluid and fetal movement were reviewed in detail with the patient.    Follow Up Instructions: Will follow up  For 1GTT and 28 wk labs soon.    I discussed the assessment and treatment plan with the patient. The patient was provided an opportunity to ask questions and all were answered. The patient agreed with the plan and demonstrated an understanding of the instructions.   The patient was advised to call back or seek an in-person evaluation if the symptoms worsen or if the condition fails to improve as anticipated.  I provided 20 minutes of non-face-to-face time during this encounter.  Return in about 1 week (around 09/27/2018) for ROB in-person, 1hr GTT.  Adelene Idler MD [facility] 09/20/2018 12:44 PM

## 2018-09-20 NOTE — Patient Instructions (Addendum)
 Hello,  Given the current COVID-19 pandemic, our practice is making changes in how we are providing care to our patients. We are limiting in-person visits for the safety of all of our patients.   As a practice, we have met to discuss the best way to minimize visits, but still provide excellent care to our expecting mothers.  We have decided on the following visit structure for low-risk pregnancies.  Initial Pregnancy visit will be conducted as a telephone or web visit.  Between 10-14 weeks  there will be one in-person visit for an ultrasound, lab work, and genetic screening. 20 weeks in-person visit with an anatomy ultrasound  28 weeks in-person office visit for a 1-hour glucose test and a TDAP vaccination 32 weeks in-person office visit 34 weeks telephone visit 36 weeks in-person office visit for GBS, chlamydia, and gonorrhea testing 38 weeks in-person office visit 40 weeks in-person office visit  Understandably, some patients will require more visits than what is outlined above. Additional visits will be determined on a case-by-case basis.   We will, as always, be available for emergencies or to address concerns that might arise between in-person visits. We ask that you allow us the opportunity to address any concerns over the phone or through a virtual visit first. We will be available to return your phone calls throughout the day.   If you are able to purchase a scale, a blood pressure machine, and a home fetal doppler visits could be limited further. This will help decrease your exposure risks, but these purchases are not a necessity.   Things seem to change daily and there is the possibility that this structure could change, please be patient as we adapt to a new way of caring for patients.   Thank you for trusting us with your prenatal care. Our practice values you and looks forward to providing you with excellent care.   Sincerely,   [facility], [facility]      COVID-19 and Your Pregnancy FAQ  How can I prevent infection with COVID-19 during my pregnancy? Social distancing is key. Please limit any interactions in public. Try and work from home if possible. Frequently wash your hands after touching possibly contaminated surfaces. Avoid touching your face.  Minimize trips to the store. Consider online ordering when possible.   Should I wear a mask? Masks should only be worn by those experiencing symptoms of COVID-19 or those with confirmed COVID-19 when they are in public or around other individuals.  What are the symptoms of COVID-19? Fever (greater than 100.4 F), dry cough, shortness of breath.  Am I more at risk for COVID-19 since I am pregnant? There is not currently data showing that pregnant women are more adversely impacted by COVID-19 than the general population. However, we know that pregnant women tend to have worse respiratory complications from similar diseases such as the flu and SARS and for this reason should be considered an at-risk population.  What do I do if I am experiencing the symptoms of COVID-19? Testing is being limited because of test availability. If you are experiencing symptoms you should quarantine yourself, and the members of your family, for at least 2 weeks at home.   Please visit this website for more information: https://www.cdc.gov/coronavirus/2019-ncov/if-you-are-sick/steps-when-sick.html  When should I go to the Emergency Room? Please go to the emergency room if you are experiencing ANY of these symptoms*:  1.    Difficulty breathing or shortness of breath 2.      Persistent pain or pressure in the chest 3.    Confusion or difficulty being aroused (or awakened) 4.    Bluish lips or face  *This list is not all inclusive. Please consult our office for any other symptoms that are severe or concerning.  What do I do if I am having difficulty breathing? You should go to the Emergency Room for  evaluation. At this time they have a tent set up for evaluating patients with COVID-19 symptoms.   How will my prenatal care be different because of the COVID-19 pandemic? It has been recommended to reduce the frequency of face-to-face visits and use resources such as telephone and virtual visits when possible. Using a scale, blood pressure machine and fetal doppler at home can further help reduce face-to-face visits. You will be provided with additional information on this topic.  We ask that you come to your visits alone to minimize potential exposures to  COVID-19.  How can I receive childbirth education? At this time in-person classes have been cancelled. You can register for online childbirth education, breastfeeding, and newborn care classes.  Please visit:  [email or web address] for more information  How will my hospital birth experience be different? The hospital is currently limiting visitors. This means that while you are in labor you can only have one person at the hospital with you. Additional family members will not be allowed to wait in the building or outside your room. Your one support person can be the father of the baby, a relative, a doula, or a friend. Once one support person is designated that person will wear a band. This band cannot be shared with multiple people.  How long will I stay in the hospital for after giving birth? It is also recommended that discharge home be expedited during the COVID-19 outbreak. This means staying for 1 day after a vaginal delivery and 2 days after a cesarean section.  What if I have COVID-19 and I am in labor? We ask that you wear a mask while on labor and delivery. We will try and accommodate you being placed in a room that is capable of filtering the air. Please call ahead if you are in labor and on your way to the hospital. The phone number for labor and delivery at [facility] is [phone].  If I have  COVID-19 when my baby is born how can I prevent my baby from contracting COVID-19? This is an issue that will have to be discussed on a case-by-case basis. Current recommendations suggest providing separate isolation rooms for both the mother and new infant as well as limiting visitors. However, there are practical challenges to this recommendation. The situation will assuredly change and decisions will be influenced by the desires of the mother and availability of space.  Some suggestions are the use of a curtain or physical barrier between mom and infant, hand hygiene, mom wearing a mask, or 6 feet of spacing between a mom and infant.   Can I breastfeed during the COVID-19 pandemic?   Yes, breastfeeding is encouraged.  Can I breastfeed if I have COVID-19? Yes. Covid-19 has not been found in breast milk. This means you cannot give COVID-19 to your child through breast milk. Breast feeding will also help pass antibodies to fight infection to your baby.   What precautions should I take when breastfeeding if I have COVID-19? If a mother and newborn do room-in and the mother wishes to feed at the breast, she should   put on a facemask and practice hand hygiene before each feeding.  What precautions should I take when pumping if I have COVID-19? Prior to expressing breast milk, mothers should practice hand hygiene. After each pumping session, all parts that come into contact with breast milk should be thoroughly washed and the entire pump should be appropriately disinfected per the manufacturer's instructions. This expressed breast milk should be fed to the newborn by a healthy caregiver.  What if I am pregnant and work in healthcare? Based on limited data regarding COVID-19 and pregnancy, ACOG currently does not propose creating additional restrictions on pregnant health care personnel because of COVID-19 alone. Pregnant women do not appear to be at higher risk of severe disease related to COVID-19.  Pregnant health care personnel should follow CDC risk assessment and infection control guidelines for health care personnel exposed to patients with suspected or confirmed COVID-19. Adherence to recommended infection prevention and control practices is an important part of protecting all health care personnel in health care settings.    Information on COVID-19 in pregnancy is very limited; however, facilities may want to consider limiting exposure of pregnant health care personnel to patients with confirmed or suspected COVID-19 infection, especially during higher-risk procedures (eg, aerosol-generating procedures), if feasible, based on staffing availability.    

## 2018-09-27 ENCOUNTER — Other Ambulatory Visit: Payer: Medicaid Other

## 2018-09-27 ENCOUNTER — Other Ambulatory Visit: Payer: Self-pay

## 2018-09-27 ENCOUNTER — Ambulatory Visit (INDEPENDENT_AMBULATORY_CARE_PROVIDER_SITE_OTHER): Payer: Medicaid Other | Admitting: Obstetrics & Gynecology

## 2018-09-27 ENCOUNTER — Encounter: Payer: Self-pay | Admitting: Obstetrics & Gynecology

## 2018-09-27 VITALS — BP 122/82 | Wt 307.0 lb

## 2018-09-27 DIAGNOSIS — O0990 Supervision of high risk pregnancy, unspecified, unspecified trimester: Secondary | ICD-10-CM

## 2018-09-27 DIAGNOSIS — O3430 Maternal care for cervical incompetence, unspecified trimester: Secondary | ICD-10-CM

## 2018-09-27 DIAGNOSIS — Z6841 Body Mass Index (BMI) 40.0 and over, adult: Secondary | ICD-10-CM

## 2018-09-27 DIAGNOSIS — Z3A27 27 weeks gestation of pregnancy: Secondary | ICD-10-CM

## 2018-09-27 DIAGNOSIS — O99212 Obesity complicating pregnancy, second trimester: Secondary | ICD-10-CM

## 2018-09-27 DIAGNOSIS — O98112 Syphilis complicating pregnancy, second trimester: Secondary | ICD-10-CM

## 2018-09-27 DIAGNOSIS — O3432 Maternal care for cervical incompetence, second trimester: Secondary | ICD-10-CM

## 2018-09-27 NOTE — Patient Instructions (Signed)
Third Trimester of Pregnancy The third trimester is from week 28 through week 40 (months 7 through 9). The third trimester is a time when the unborn baby (fetus) is growing rapidly. At the end of the ninth month, the fetus is about 20 inches in length and weighs 6-10 pounds. Body changes during your third trimester Your body will continue to go through many changes during pregnancy. The changes vary from woman to woman. During the third trimester:  Your weight will continue to increase. You can expect to gain 25-35 pounds (11-16 kg) by the end of the pregnancy.  You may begin to get stretch marks on your hips, abdomen, and breasts.  You may urinate more often because the fetus is moving lower into your pelvis and pressing on your bladder.  You may develop or continue to have heartburn. This is caused by increased hormones that slow down muscles in the digestive tract.  You may develop or continue to have constipation because increased hormones slow digestion and cause the muscles that push waste through your intestines to relax.  You may develop hemorrhoids. These are swollen veins (varicose veins) in the rectum that can itch or be painful.  You may develop swollen, bulging veins (varicose veins) in your legs.  You may have increased body aches in the pelvis, back, or thighs. This is due to weight gain and increased hormones that are relaxing your joints.  You may have changes in your hair. These can include thickening of your hair, rapid growth, and changes in texture. Some women also have hair loss during or after pregnancy, or hair that feels dry or thin. Your hair will most likely return to normal after your baby is born.  Your breasts will continue to grow and they will continue to become tender. A yellow fluid (colostrum) may leak from your breasts. This is the first milk you are producing for your baby.  Your belly button may stick out.  You may notice more swelling in your hands,  face, or ankles.  You may have increased tingling or numbness in your hands, arms, and legs. The skin on your belly may also feel numb.  You may feel short of breath because of your expanding uterus.  You may have more problems sleeping. This can be caused by the size of your belly, increased need to urinate, and an increase in your body's metabolism.  You may notice the fetus "dropping," or moving lower in your abdomen (lightening).  You may have increased vaginal discharge.  You may notice your joints feel loose and you may have pain around your pelvic bone. What to expect at prenatal visits You will have prenatal exams every 2 weeks until week 36. Then you will have weekly prenatal exams. During a routine prenatal visit:  You will be weighed to make sure you and the baby are growing normally.  Your blood pressure will be taken.  Your abdomen will be measured to track your baby's growth.  The fetal heartbeat will be listened to.  Any test results from the previous visit will be discussed.  You may have a cervical check near your due date to see if your cervix has softened or thinned (effaced).  You will be tested for Group B streptococcus. This happens between 35 and 37 weeks. Your health care provider may ask you:  What your birth plan is.  How you are feeling.  If you are feeling the baby move.  If you have had any abnormal   symptoms, such as leaking fluid, bleeding, severe headaches, or abdominal cramping.  If you are using any tobacco products, including cigarettes, chewing tobacco, and electronic cigarettes.  If you have any questions. Other tests or screenings that may be performed during your third trimester include:  Blood tests that check for low iron levels (anemia).  Fetal testing to check the health, activity level, and growth of the fetus. Testing is done if you have certain medical conditions or if there are problems during the pregnancy.  Nonstress test  (NST). This test checks the health of your baby to make sure there are no signs of problems, such as the baby not getting enough oxygen. During this test, a belt is placed around your belly. The baby is made to move, and its heart rate is monitored during movement. What is false labor? False labor is a condition in which you feel small, irregular tightenings of the muscles in the womb (contractions) that usually go away with rest, changing position, or drinking water. These are called Braxton Hicks contractions. Contractions may last for hours, days, or even weeks before true labor sets in. If contractions come at regular intervals, become more frequent, increase in intensity, or become painful, you should see your health care provider. What are the signs of labor?  Abdominal cramps.  Regular contractions that start at 10 minutes apart and become stronger and more frequent with time.  Contractions that start on the top of the uterus and spread down to the lower abdomen and back.  Increased pelvic pressure and dull back pain.  A watery or bloody mucus discharge that comes from the vagina.  Leaking of amniotic fluid. This is also known as your "water breaking." It could be a slow trickle or a gush. Let your health care provider know if it has a color or strange odor. If you have any of these signs, call your health care provider right away, even if it is before your due date. Follow these instructions at home: Medicines  Follow your health care provider's instructions regarding medicine use. Specific medicines may be either safe or unsafe to take during pregnancy.  Take a prenatal vitamin that contains at least 600 micrograms (mcg) of folic acid.  If you develop constipation, try taking a stool softener if your health care provider approves. Eating and drinking   Eat a balanced diet that includes fresh fruits and vegetables, whole grains, good sources of protein such as meat, eggs, or tofu,  and low-fat dairy. Your health care provider will help you determine the amount of weight gain that is right for you.  Avoid raw meat and uncooked cheese. These carry germs that can cause birth defects in the baby.  If you have low calcium intake from food, talk to your health care provider about whether you should take a daily calcium supplement.  Eat four or five small meals rather than three large meals a day.  Limit foods that are high in fat and processed sugars, such as fried and sweet foods.  To prevent constipation: ? Drink enough fluid to keep your urine clear or pale yellow. ? Eat foods that are high in fiber, such as fresh fruits and vegetables, whole grains, and beans. Activity  Exercise only as directed by your health care provider. Most women can continue their usual exercise routine during pregnancy. Try to exercise for 30 minutes at least 5 days a week. Stop exercising if you experience uterine contractions.  Avoid heavy lifting.  Do   not exercise in extreme heat or humidity, or at high altitudes.  Wear low-heel, comfortable shoes.  Practice good posture.  You may continue to have sex unless your health care provider tells you otherwise. Relieving pain and discomfort  Take frequent breaks and rest with your legs elevated if you have leg cramps or low back pain.  Take warm sitz baths to soothe any pain or discomfort caused by hemorrhoids. Use hemorrhoid cream if your health care provider approves.  Wear a good support bra to prevent discomfort from breast tenderness.  If you develop varicose veins: ? Wear support pantyhose or compression stockings as told by your healthcare provider. ? Elevate your feet for 15 minutes, 3-4 times a day. Prenatal care  Write down your questions. Take them to your prenatal visits.  Keep all your prenatal visits as told by your health care provider. This is important. Safety  Wear your seat belt at all times when driving.  Make  a list of emergency phone numbers, including numbers for family, friends, the hospital, and police and fire departments. General instructions  Avoid cat litter boxes and soil used by cats. These carry germs that can cause birth defects in the baby. If you have a cat, ask someone to clean the litter box for you.  Do not travel far distances unless it is absolutely necessary and only with the approval of your health care provider.  Do not use hot tubs, steam rooms, or saunas.  Do not drink alcohol.  Do not use any products that contain nicotine or tobacco, such as cigarettes and e-cigarettes. If you need help quitting, ask your health care provider.  Do not use any medicinal herbs or unprescribed drugs. These chemicals affect the formation and growth of the baby.  Do not douche or use tampons or scented sanitary pads.  Do not cross your legs for long periods of time.  To prepare for the arrival of your baby: ? Take prenatal classes to understand, practice, and ask questions about labor and delivery. ? Make a trial run to the hospital. ? Visit the hospital and tour the maternity area. ? Arrange for maternity or paternity leave through employers. ? Arrange for family and friends to take care of pets while you are in the hospital. ? Purchase a rear-facing car seat and make sure you know how to install it in your car. ? Pack your hospital bag. ? Prepare the baby's nursery. Make sure to remove all pillows and stuffed animals from the baby's crib to prevent suffocation.  Visit your dentist if you have not gone during your pregnancy. Use a soft toothbrush to brush your teeth and be gentle when you floss. Contact a health care provider if:  You are unsure if you are in labor or if your water has broken.  You become dizzy.  You have mild pelvic cramps, pelvic pressure, or nagging pain in your abdominal area.  You have lower back pain.  You have persistent nausea, vomiting, or  diarrhea.  You have an unusual or bad smelling vaginal discharge.  You have pain when you urinate. Get help right away if:  Your water breaks before 37 weeks.  You have regular contractions less than 5 minutes apart before 37 weeks.  You have a fever.  You are leaking fluid from your vagina.  You have spotting or bleeding from your vagina.  You have severe abdominal pain or cramping.  You have rapid weight loss or weight gain.  You have   shortness of breath with chest pain.  You notice sudden or extreme swelling of your face, hands, ankles, feet, or legs.  Your baby makes fewer than 10 movements in 2 hours.  You have severe headaches that do not go away when you take medicine.  You have vision changes. Summary  The third trimester is from week 28 through week 40, months 7 through 9. The third trimester is a time when the unborn baby (fetus) is growing rapidly.  During the third trimester, your discomfort may increase as you and your baby continue to gain weight. You may have abdominal, leg, and back pain, sleeping problems, and an increased need to urinate.  During the third trimester your breasts will keep growing and they will continue to become tender. A yellow fluid (colostrum) may leak from your breasts. This is the first milk you are producing for your baby.  False labor is a condition in which you feel small, irregular tightenings of the muscles in the womb (contractions) that eventually go away. These are called Braxton Hicks contractions. Contractions may last for hours, days, or even weeks before true labor sets in.  Signs of labor can include: abdominal cramps; regular contractions that start at 10 minutes apart and become stronger and more frequent with time; watery or bloody mucus discharge that comes from the vagina; increased pelvic pressure and dull back pain; and leaking of amniotic fluid. This information is not intended to replace advice given to you by your  health care provider. Make sure you discuss any questions you have with your health care provider. Document Released: 06/03/2001 Document Revised: 07/15/2016 Document Reviewed: 07/15/2016 Elsevier Interactive Patient Education  2019 [employer]  

## 2018-09-27 NOTE — Progress Notes (Signed)
  Subjective  Fetal Movement? yes Contractions? no Leaking Fluid? no Vaginal Bleeding? no Pt has some upper GI pains for the last 2 days, intermittent and mild and no radiation; no associated sx and no ctxs, VB , or ROM  Objective  BP 122/82   Wt (!) 307 lb (139.3 kg)   LMP 03/16/2018 (Approximate)   BMI 48.08 kg/m  General: NAD Pumonary: no increased work of breathing Abdomen: gravid, non-tender Extremities: no edema Psychiatric: mood appropriate, affect full  Assessment  33 y.o. [id number] at [redacted]w[redacted]d by  12/22/2018, Alternate EDD Entry presenting for routine prenatal visit  Plan   Problem List Items Addressed This Visit      Genitourinary   Cervical cerclage suture present, antepartum    No change, remove 36-37 weeks     Other   High-risk pregnancy supervision, unspecified trimester   Morbid obesity with BMI of 40.0-44.9, adult (HCC)    BMI >=40 [x ] early 1h gtt   [x ] u/s for dating  [x ] nutritional goals [x ] folic acid 1mg  [x ] bASA (>12 weeks) [x ] consider nutrition consult [x ] consider maternal EKG 1st trimester [x ] Growth u/s 28 [ ] , 32 [ ] , 36 weeks [ ]  [x ] NST/AFI weekly 36+ weeks (36[] , 37[] , 38[] , 39[] , 40[] ) [x ] IOL by 41 weeks (scheduled, prn [] ) [x ] Anesthesia consult 34 weeks    [redacted] weeks gestation of pregnancy        PNV    Glucola today   Syphilis affecting pregnancy in second trimester     Treated    RPR today         Annamarie Major, MD, [facility], [facility] 09/27/2018  9:24 AM

## 2018-09-29 LAB — 28 WEEK RH+PANEL
Basophils Absolute: 0 10*3/uL (ref 0.0–0.2)
Basos: 0 %
EOS (ABSOLUTE): 0.1 10*3/uL (ref 0.0–0.4)
Eos: 1 %
Gestational Diabetes Screen: 158 mg/dL — ABNORMAL HIGH (ref 65–139)
HIV Screen 4th Generation wRfx: NONREACTIVE
Hematocrit: 36.2 % (ref 34.0–46.6)
Hemoglobin: 11.6 g/dL (ref 11.1–15.9)
Immature Grans (Abs): 0.1 10*3/uL (ref 0.0–0.1)
Immature Granulocytes: 1 %
Lymphocytes Absolute: 2.8 10*3/uL (ref 0.7–3.1)
Lymphs: 14 %
MCH: 28.4 pg (ref 26.6–33.0)
MCHC: 32 g/dL (ref 31.5–35.7)
MCV: 89 fL (ref 79–97)
Monocytes Absolute: 0.8 10*3/uL (ref 0.1–0.9)
Monocytes: 4 %
Neutrophils Absolute: 16.8 10*3/uL — ABNORMAL HIGH (ref 1.4–7.0)
Neutrophils: 80 %
Platelets: 336 10*3/uL (ref 150–450)
RBC: 4.08 x10E6/uL (ref 3.77–5.28)
RDW: 13.1 % (ref 11.7–15.4)
RPR Ser Ql: REACTIVE — AB
WBC: 20.7 10*3/uL (ref 3.4–10.8)

## 2018-09-29 LAB — RPR, QUANT+TP ABS (REFLEX)
Rapid Plasma Reagin, Quant: 1:8 {titer} — ABNORMAL HIGH
T Pallidum Abs: REACTIVE — AB

## 2018-09-30 ENCOUNTER — Other Ambulatory Visit: Payer: Self-pay | Admitting: Obstetrics and Gynecology

## 2018-09-30 DIAGNOSIS — O99810 Abnormal glucose complicating pregnancy: Secondary | ICD-10-CM

## 2018-09-30 NOTE — Progress Notes (Signed)
Called ad discussed 3hr glucose, patient transferred to schedule this appointment.

## 2018-10-02 ENCOUNTER — Other Ambulatory Visit: Payer: Self-pay

## 2018-10-02 ENCOUNTER — Inpatient Hospital Stay
Admission: EM | Admit: 2018-10-02 | Discharge: 2018-10-02 | Disposition: A | Discharge status: Home / Self Care | Payer: Medicaid Other | Attending: Obstetrics and Gynecology | Admitting: Obstetrics and Gynecology

## 2018-10-02 DIAGNOSIS — K219 Gastro-esophageal reflux disease without esophagitis: Secondary | ICD-10-CM | POA: Insufficient documentation

## 2018-10-02 DIAGNOSIS — R109 Unspecified abdominal pain: Secondary | ICD-10-CM | POA: Insufficient documentation

## 2018-10-02 DIAGNOSIS — O98111 Syphilis complicating pregnancy, first trimester: Secondary | ICD-10-CM

## 2018-10-02 DIAGNOSIS — O99612 Diseases of the digestive system complicating pregnancy, second trimester: Secondary | ICD-10-CM | POA: Insufficient documentation

## 2018-10-02 DIAGNOSIS — O09299 Supervision of pregnancy with other poor reproductive or obstetric history, unspecified trimester: Secondary | ICD-10-CM

## 2018-10-02 DIAGNOSIS — Z3A28 28 weeks gestation of pregnancy: Secondary | ICD-10-CM | POA: Diagnosis not present

## 2018-10-02 DIAGNOSIS — Z9889 Other specified postprocedural states: Secondary | ICD-10-CM

## 2018-10-02 DIAGNOSIS — O0990 Supervision of high risk pregnancy, unspecified, unspecified trimester: Secondary | ICD-10-CM

## 2018-10-02 DIAGNOSIS — O0001 Abdominal pregnancy with intrauterine pregnancy: Secondary | ICD-10-CM | POA: Insufficient documentation

## 2018-10-02 DIAGNOSIS — N883 Incompetence of cervix uteri: Secondary | ICD-10-CM

## 2018-10-02 DIAGNOSIS — O26893 Other specified pregnancy related conditions, third trimester: Secondary | ICD-10-CM | POA: Diagnosis not present

## 2018-10-02 DIAGNOSIS — M549 Dorsalgia, unspecified: Secondary | ICD-10-CM | POA: Diagnosis not present

## 2018-10-02 DIAGNOSIS — O26892 Other specified pregnancy related conditions, second trimester: Secondary | ICD-10-CM | POA: Insufficient documentation

## 2018-10-02 DIAGNOSIS — O09213 Supervision of pregnancy with history of pre-term labor, third trimester: Secondary | ICD-10-CM | POA: Diagnosis not present

## 2018-10-02 LAB — CBC WITH DIFFERENTIAL/PLATELET
Abs Immature Granulocytes: 0.19 10*3/uL — ABNORMAL HIGH (ref 0.00–0.07)
Basophils Absolute: 0 10*3/uL (ref 0.0–0.1)
Basophils Relative: 0 %
Eosinophils Absolute: 0.2 10*3/uL (ref 0.0–0.5)
Eosinophils Relative: 1 %
HCT: 37.2 % (ref 36.0–46.0)
Hemoglobin: 12 g/dL (ref 12.0–15.0)
Immature Granulocytes: 1 %
Lymphocytes Relative: 12 %
Lymphs Abs: 2.7 10*3/uL (ref 0.7–4.0)
MCH: 28.6 pg (ref 26.0–34.0)
MCHC: 32.3 g/dL (ref 30.0–36.0)
MCV: 88.6 fL (ref 80.0–100.0)
Monocytes Absolute: 1.1 10*3/uL — ABNORMAL HIGH (ref 0.1–1.0)
Monocytes Relative: 5 %
Neutro Abs: 18.6 10*3/uL — ABNORMAL HIGH (ref 1.7–7.7)
Neutrophils Relative %: 81 %
Platelets: 325 10*3/uL (ref 150–400)
RBC: 4.2 MIL/uL (ref 3.87–5.11)
RDW: 13.9 % (ref 11.5–15.5)
WBC: 22.8 10*3/uL — ABNORMAL HIGH (ref 4.0–10.5)
nRBC: 0 % (ref 0.0–0.2)

## 2018-10-02 LAB — URINALYSIS, ROUTINE W REFLEX MICROSCOPIC
Bilirubin Urine: NEGATIVE
Glucose, UA: NEGATIVE mg/dL
Hgb urine dipstick: NEGATIVE
Ketones, ur: NEGATIVE mg/dL
Leukocytes,Ua: NEGATIVE
Nitrite: NEGATIVE
Protein, ur: NEGATIVE mg/dL
Specific Gravity, Urine: 1.024 (ref 1.005–1.030)
pH: 5 (ref 5.0–8.0)

## 2018-10-02 NOTE — Final Progress Note (Signed)
Physician Final Progress Note  Patient ID: Donna Navarro MRN: [id number] DOB/AGE: 1986/02/06 33 y.o.  Admit date: 10/02/2018 Admitting provider: Conard Novak, MD Discharge date: 10/02/2018   Admission Diagnoses:  1) intrauterine pregnancy at [redacted]w[redacted]d  2) abdominal pain in pregnancy, third trimester 3) back pain in pregnancy, third trimester 4) history of preterm delivery, patient has cerclage 5) history of syphilis during current pregnancy 6) Neutrophilia in pregnancy, third trimester  Discharge Diagnoses:  1) intrauterine pregnancy at [redacted]w[redacted]d  2) abdominal pain in pregnancy, third trimester 3) back pain in pregnancy, third trimester 4) history of preterm delivery, patient has cerclage 5) history of syphilis during current pregnancy 6) Neutrophilia in pregnancy, third trimester  History of Present Illness: The patient is a 33 y.o. female [phone] at [redacted]w[redacted]d who presents for three days of lower abdominal cramping, better today. She has had some associated right lower back pain. Her cramping is much better today. The intensity was described as moderate, mild today.  Aggravating factors: movement and activity. Alleviating factors: lying down and resting. Associated symptoms: right lower back pain (near iliac crest).  Denies fevers, chills. She has had some mild nausea with one episode of emesis. She had a single episode of diarrhea yesterday. She denies any contacts with similar symptoms.  Notably, in her pregnancy she has had a positive RPR, +TPA test (quantitative 1:4 in December).  RPR 1 week ago was positive (quantitaive 1:8).  She denies any symptoms of syphilis. She states she had been treated in December/January x 3 IM doses. She had three weekly injections. She states her partner was given a single dose of treatment and he was tested negative. So, he received no further treatment. She denies eye pain, issues with her vision.  She denies any other eye and visual issues.   Past Medical  History:  Diagnosis Date  . GERD (gastroesophageal reflux disease)    with pregnancy-no meds  . Hypertension    lost weight and pcp took pt off bp meds 7 months  . Irregular periods   . Morbid obesity (HCC) 04/30/2017  . Ventral hernia without obstruction or gangrene 04/30/2017    Past Surgical History:  Procedure Laterality Date  . CERVICAL CERCLAGE N/A 06/17/2018   Procedure: CERCLAGE CERVICAL;  Surgeon: Natale Milch, MD;  Location: [facility] ORS;  Service: Gynecology;  Laterality: N/A;  . CHOLECYSTECTOMY      No current facility-administered medications on file prior to encounter.    Current Outpatient Medications on File Prior to Encounter  Medication Sig Dispense Refill  . acetaminophen (TYLENOL) 325 MG tablet Take 650 mg by mouth every 6 (six) hours as needed.    . calcium carbonate (TUMS - DOSED IN MG ELEMENTAL CALCIUM) 500 MG chewable tablet Chew 1 tablet by mouth daily.    . prenatal vitamin w/FE, FA (NATACHEW) 29-1 MG CHEW chewable tablet Chew 1 tablet by mouth daily.     . sucralfate (CARAFATE) 1 g tablet Take 1 tablet (1 g total) by mouth 4 (four) times daily. (Patient not taking: Reported on 10/02/2018) 60 tablet 0    No Known Allergies  Social History   Socioeconomic History  . Marital status: Married    Spouse name: Not on file  . Number of children: 2  . Years of education: Not on file  . Highest education level: Not on file  Occupational History  . Not on file  Social Needs  . Financial resource strain: Not hard at all  . Food  insecurity:    Worry: Never true    Inability: Never true  . Transportation needs:    Medical: No    Non-medical: No  Tobacco Use  . Smoking status: Never Smoker  . Smokeless tobacco: Never Used  Substance and Sexual Activity  . Alcohol use: Never    Frequency: Never  . Drug use: Never  . Sexual activity: Yes    Birth control/protection: Surgical    Comment: BTL  Lifestyle  . Physical activity:    Days per week: 4  days    Minutes per session: 30 min  . Stress: Only a little  Relationships  . Social connections:    Talks on phone: Patient refused    Gets together: Patient refused    Attends religious service: Patient refused    Active member of club or organization: Patient refused    Attends meetings of clubs or organizations: Patient refused    Relationship status: Patient refused  . Intimate partner violence:    Fear of current or ex partner: No    Emotionally abused: No    Physically abused: No    Forced sexual activity: No  Other Topics Concern  . Not on file  Social History Narrative  . Not on file    Family History  Problem Relation Age of Onset  . Heart disease Mother        Massive Heart Attack     Review of Systems  Constitutional: Negative.  Negative for chills and fever.  HENT: Negative.   Eyes: Negative.  Negative for blurred vision, double vision, photophobia, pain, discharge and redness.  Respiratory: Negative.  Negative for cough, hemoptysis, shortness of breath and wheezing.   Cardiovascular: Negative.  Negative for chest pain and palpitations.  Gastrointestinal: Positive for abdominal pain (see HPI), diarrhea, nausea and vomiting. Negative for blood in stool, constipation, heartburn and melena.  Genitourinary: Negative.  Negative for dysuria, flank pain, frequency, hematuria and urgency.  Musculoskeletal: Negative.   Skin: Negative.   Neurological: Negative.   Psychiatric/Behavioral: Negative.      Physical Exam: BP 138/85 (BP Location: Left Arm)   Pulse (!) 115   Temp 98.1 F (36.7 C) (Oral)   Resp 16   Ht 5\' 8"  (1.727 m)   Wt (!) 137.9 kg   LMP 03/16/2018 (Approximate)   BMI 46.22 kg/m   Physical Exam Constitutional:      General: She is not in acute distress.    Appearance: Normal appearance. She is obese.  HENT:     Head: Normocephalic and atraumatic.  Eyes:     General: No scleral icterus.    Conjunctiva/sclera: Conjunctivae normal.  Neck:      Musculoskeletal: Normal range of motion. No neck rigidity.  Cardiovascular:     Rate and Rhythm: Tachycardia present.     Heart sounds: No murmur. No friction rub. No gallop.   Pulmonary:     Effort: Pulmonary effort is normal. No respiratory distress.     Breath sounds: Normal breath sounds. No wheezing or rales.  Abdominal:     Tenderness: There is no right CVA tenderness or left CVA tenderness.     Comments: Gravid, NT, hernia previously noted  Musculoskeletal: Normal range of motion.  Neurological:     General: No focal deficit present.     Mental Status: She is alert and oriented to person, place, and time.     Cranial Nerves: No cranial nerve deficit.  Skin:    General: Skin  is warm and dry.     Coloration: Skin is not jaundiced.  Psychiatric:        Mood and Affect: Mood normal.        Behavior: Behavior normal.        Judgment: Judgment normal.    Consults: Infectious disease Dr. Rivka Safer by telephone for handling of positive RPR with increase in titer since December. This was an unofficial consult. See Hospital Course  Significant Findings/ Diagnostic Studies:  Lab Results  Component Value Date   APPEARANCEUR CLOUDY (A) 10/02/2018   GLUCOSEU NEGATIVE 10/02/2018   BILIRUBINUR NEGATIVE 10/02/2018   KETONESUR NEGATIVE 10/02/2018   LABSPEC 1.024 10/02/2018   HGBUR NEGATIVE 10/02/2018   PHURINE 5.0 10/02/2018   NITRITE NEGATIVE 10/02/2018   LEUKOCYTESUR NEGATIVE 10/02/2018   RBCU 0-5 08/17/2018   WBCU 0-5 08/17/2018   BACTERIA MANY (A) 08/17/2018   EPIU 6-10 08/17/2018   CBC    Component Value Date/Time   WBC 22.8 (H) 10/02/2018 1443   RBC 4.20 10/02/2018 1443   HGB 12.0 10/02/2018 1443   HCT 37.2 10/02/2018 1443   PLT 325 10/02/2018 1443   MCV 88.6 10/02/2018 1443   MCH 28.6 10/02/2018 1443   MCHC 32.3 10/02/2018 1443   RDW 13.9 10/02/2018 1443   LYMPHSABS 2.7 10/02/2018 1443   MONOABS 1.1 (H) 10/02/2018 1443   EOSABS 0.2 10/02/2018 1443   BASOSABS  0.0 10/02/2018 1443   Pending: peripheral blood smear, attempted urine culture add-on  Procedures:  NST: Baseline FHR: 145 beats/min Variability: moderate Accelerations: present (10 x 10) Decelerations: absent Tocometry: quiet  Interpretation:  INDICATIONS: rule out uterine contractions RESULTS:  A NST procedure was performed with FHR monitoring and a normal baseline established, appropriate time of 20-40 minutes of evaluation, and accels >2 seen w 15x15 characteristics.  Results show a REACTIVE NST.    Hospital Course: The patient was admitted to Labor and Delivery Triage for observation. She had no focal findings to explain her pain. However, by the time of assessment she was feeling much better and in the position she was lying she felt much better.  Her fetal tracing was reassuring.  Her vital signs were normal, apart from mild, asymptomatic tachycardia.  Her urinalysis was mildly suggestive of infection. A urine culture was added on.  It was noted that her quantified RPR was increase from 1:4 to 1:8 from December to 4/6.  I contacted the Ravishankar from Infectious Disease and discussed the findings and her CBCs with elevated WBC for nearly a year. She recommended repeat of her RPR at 32 weeks, giving the antibiotic time to lower the RPR.  She also recommended ordering a CBC with peripheral smear with consideration of referral to MFM and/or hematology for the WBC and continued elevated RPR titers.  The patient had no neurosyphilis symptoms.  She was feeling good fetal movement.  Given her clinical stability, she was discharged to home in stable condition.   Discharge Condition: improved.  Disposition: Discharge disposition: 01-Home or Self Care       Diet: Regular diet  Discharge Activity: Activity as tolerated   Allergies as of 10/02/2018   No Known Allergies     Medication List    STOP taking these medications   sucralfate 1 g tablet Commonly known as:  Carafate      TAKE these medications   acetaminophen 325 MG tablet Commonly known as:  TYLENOL Take 650 mg by mouth every 6 (six) hours as needed.  calcium carbonate 500 MG chewable tablet Commonly known as:  TUMS - dosed in mg elemental calcium Chew 1 tablet by mouth daily.   prenatal vitamin w/FE, FA 29-1 MG Chew chewable tablet Chew 1 tablet by mouth daily.        Total time spent taking care of this patient: 60 minutes  Signed: Thomasene MohairStephen Annitta Fifield, MD  10/02/2018, 2:54 PM

## 2018-10-02 NOTE — OB Triage Note (Signed)
Pt is a 33 yo [id number] at [redacted]w[redacted]d that presents from home with c/o ctx. Pt states ctx started last week and the pt was seen at [facility] and provider told her it was possibly braxton hicks. Pt has a cerclage in place and denies VB, LOF and states positive FM. Pt states the ctx have gotten more intense the past 3 days. Pt states the ctx or "here and there but very intense". Pt rates the ctx 7/10 pain scale. Monitors applied and initial FHT 145. Provider notified of pt arrival to unit.

## 2018-10-02 NOTE — Discharge Summary (Signed)
See Final Progress note 

## 2018-10-02 NOTE — OB Triage Note (Signed)
Discharge instructions reviewed with patient. Pt instructed to follow up with provider and when to call the OB for concerns. Pt discharged in stable condition with all questions answered and this time.

## 2018-10-04 LAB — PATHOLOGIST SMEAR REVIEW

## 2018-10-05 LAB — URINE CULTURE

## 2018-10-06 ENCOUNTER — Other Ambulatory Visit: Payer: Self-pay

## 2018-10-06 ENCOUNTER — Other Ambulatory Visit: Payer: Self-pay | Admitting: Obstetrics and Gynecology

## 2018-10-06 ENCOUNTER — Other Ambulatory Visit: Payer: Medicaid Other

## 2018-10-06 DIAGNOSIS — O99810 Abnormal glucose complicating pregnancy: Secondary | ICD-10-CM

## 2018-10-06 DIAGNOSIS — R7309 Other abnormal glucose: Secondary | ICD-10-CM

## 2018-10-07 LAB — GESTATIONAL GLUCOSE TOLERANCE
Glucose, Fasting: 90 mg/dL (ref 65–94)
Glucose, GTT - 1 Hour: 175 mg/dL (ref 65–179)
Glucose, GTT - 2 Hour: 193 mg/dL — ABNORMAL HIGH (ref 65–154)
Glucose, GTT - 3 Hour: 127 mg/dL (ref 65–139)

## 2018-10-08 NOTE — Progress Notes (Signed)
Only one elevated value, released to mychart with note. Note gestational diabetic.

## 2018-10-11 ENCOUNTER — Other Ambulatory Visit: Payer: Self-pay

## 2018-10-11 ENCOUNTER — Ambulatory Visit (INDEPENDENT_AMBULATORY_CARE_PROVIDER_SITE_OTHER): Payer: Medicaid Other | Admitting: Maternal Newborn

## 2018-10-11 DIAGNOSIS — O99213 Obesity complicating pregnancy, third trimester: Secondary | ICD-10-CM

## 2018-10-11 DIAGNOSIS — Z3A29 29 weeks gestation of pregnancy: Secondary | ICD-10-CM | POA: Diagnosis not present

## 2018-10-11 DIAGNOSIS — O0990 Supervision of high risk pregnancy, unspecified, unspecified trimester: Secondary | ICD-10-CM

## 2018-10-11 NOTE — Progress Notes (Signed)
Prenatal Care Visit   Virtual Visit via Telephone Note  I connected with Donna Navarro on 10/13/18 at 10:20 AM EDT by telephone and verified that I am speaking with the correct person using two identifiers. [facility]. The patient's location was at her home.   I discussed the limitations, risks, security and privacy concerns of performing an evaluation and management service by telephone and the availability of in person appointments.The patient expressed understanding and agreed to proceed.  See OB note below for content of the telephone visit.   I discussed the assessment and treatment plan with the patient. The patient was provided an opportunity to ask questions and all were answered. The patient agreed with the plan and demonstrated an understanding of the instructions.   The patient was advised to call back or seek an in-person evaluation if the symptoms worsen or if the condition fails to improve as anticipated.  I provided 10 minutes of non-face-to-face time during this encounter.  Oswaldo ConroyJacelyn Y , CNM   OB Note  Subjective  Donna [name] is a 33 y.o. [phone] at 1255w5d contacted today for ongoing prenatal care.  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  ----------------------------------------------------------------------------------- Patient reports some pain in the area of her hernia. Sometimes she has to stay in bed and rest because of the pain; this happened two days last week.  She does not have nausea or vomiting and is able to eat regular meals.  No vaginal bleeding or contractions. No leaking of fluid. Baby is moving well. ----------------------------------------------------------------------------------- The following portions of the patient's history were reviewed and updated as appropriate: allergies, current medications, past family history, past medical history, past social history, past surgical history and problem list. Problem list updated.  Objective  Last menstrual period 03/16/2018. Pregravid weight 289 lb (131.1 kg) Total Weight Gain 15 lb (6.804 kg)  Fetal Status:     Movement: Present     Physical exam not done due to telephone visit.   Assessment   33 y.o. [id number] at 8055w5d EDD 12/22/2018, Alternate EDD Entry presenting for a routine prenatal visit.  Plan   Pregnancy #6 Problems (from 05/17/18 to present)    Problem Noted Resolved   Syphilis affecting pregnancy in first trimester 08/23/2018 by Conard NovakJackson, Stephen D, MD No   Overview Signed 08/23/2018 12:07 PM by Conard NovakJackson, Stephen D, MD    Treated at [facility] - retest at 28 weeks (RPR)      High-risk pregnancy supervision, unspecified trimester 05/17/2018 by Natale MilchSchuman, Christanna R, MD No   Overview Addendum 09/20/2018 12:35 PM by Natale MilchSchuman, Christanna R, MD      [facility] Prenatal Labs  Dating  T1 US Blood type: --/--/A POS (12/23 1305)   Genetic Screen NIPS:   Normal XX Antibody:NEG (12/23 1305)  Anatomic US complete Rubella: 2.87 (12/20 1636)  Varicella: Immune  GTT Early: hgba1c 5.4       28 wk:      RPR: Reactive (12/20 1636)  Treatment at [facility]  Rhogam  not needed HBsAg: Negative (12/20 1636)   TDaP vaccine  HIV: Non Reactive (12/20 1636)   Flu Shot    Declines                            GBS:   Contraception  BTL- [ ]  consents Okay with IUD or Nexplanon after birth Pap: NIL 2019  CBB     CS/VBAC  not needed   Baby Food  bottle   Support Person   significant other-  [name]           Cervical incompetence 05/17/2018 by Natale Milch, MD No   Hx of cerclage, currently pregnant 05/17/2018 by Natale Milch, MD No   Overview Addendum 06/28/2018  4:16 PM by Oswaldo Conroy, CNM    - cerclage after [redacted] weeks gestation [x]            Preterm labor symptoms and general obstetric precautions were reviewed.  Please refer to After Visit Summary for other counseling recommendations.   Keep scheduled in person visit and ultrasound on 10/25/2018.  10/11/2018

## 2018-10-11 NOTE — Progress Notes (Signed)
Patient stated address & DOB to verify identity.

## 2018-10-13 ENCOUNTER — Encounter: Payer: Self-pay | Admitting: Maternal Newborn

## 2018-10-13 NOTE — Patient Instructions (Signed)
Third Trimester of Pregnancy The third trimester is from week 28 through week 40 (months 7 through 9). The third trimester is a time when the unborn baby (fetus) is growing rapidly. At the end of the ninth month, the fetus is about 20 inches in length and weighs 6-10 pounds. Body changes during your third trimester Your body will continue to go through many changes during pregnancy. The changes vary from woman to woman. During the third trimester:  Your weight will continue to increase. You can expect to gain 25-35 pounds (11-16 kg) by the end of the pregnancy.  You may begin to get stretch marks on your hips, abdomen, and breasts.  You may urinate more often because the fetus is moving lower into your pelvis and pressing on your bladder.  You may develop or continue to have heartburn. This is caused by increased hormones that slow down muscles in the digestive tract.  You may develop or continue to have constipation because increased hormones slow digestion and cause the muscles that push waste through your intestines to relax.  You may develop hemorrhoids. These are swollen veins (varicose veins) in the rectum that can itch or be painful.  You may develop swollen, bulging veins (varicose veins) in your legs.  You may have increased body aches in the pelvis, back, or thighs. This is due to weight gain and increased hormones that are relaxing your joints.  You may have changes in your hair. These can include thickening of your hair, rapid growth, and changes in texture. Some women also have hair loss during or after pregnancy, or hair that feels dry or thin. Your hair will most likely return to normal after your baby is born.  Your breasts will continue to grow and they will continue to become tender. A yellow fluid (colostrum) may leak from your breasts. This is the first milk you are producing for your baby.  Your belly button may stick out.  You may notice more swelling in your hands,  face, or ankles.  You may have increased tingling or numbness in your hands, arms, and legs. The skin on your belly may also feel numb.  You may feel short of breath because of your expanding uterus.  You may have more problems sleeping. This can be caused by the size of your belly, increased need to urinate, and an increase in your body's metabolism.  You may notice the fetus "dropping," or moving lower in your abdomen (lightening).  You may have increased vaginal discharge.  You may notice your joints feel loose and you may have pain around your pelvic bone. What to expect at prenatal visits You will have prenatal exams every 2 weeks until week 36. Then you will have weekly prenatal exams. During a routine prenatal visit:  You will be weighed to make sure you and the baby are growing normally.  Your blood pressure will be taken.  Your abdomen will be measured to track your baby's growth.  The fetal heartbeat will be listened to.  Any test results from the previous visit will be discussed.  You may have a cervical check near your due date to see if your cervix has softened or thinned (effaced).  You will be tested for Group B streptococcus. This happens between 35 and 37 weeks. Your health care provider may ask you:  What your birth plan is.  How you are feeling.  If you are feeling the baby move.  If you have had any abnormal   symptoms, such as leaking fluid, bleeding, severe headaches, or abdominal cramping.  If you are using any tobacco products, including cigarettes, chewing tobacco, and electronic cigarettes.  If you have any questions. Other tests or screenings that may be performed during your third trimester include:  Blood tests that check for low iron levels (anemia).  Fetal testing to check the health, activity level, and growth of the fetus. Testing is done if you have certain medical conditions or if there are problems during the pregnancy.  Nonstress test  (NST). This test checks the health of your baby to make sure there are no signs of problems, such as the baby not getting enough oxygen. During this test, a belt is placed around your belly. The baby is made to move, and its heart rate is monitored during movement. What is false labor? False labor is a condition in which you feel small, irregular tightenings of the muscles in the womb (contractions) that usually go away with rest, changing position, or drinking water. These are called Braxton Hicks contractions. Contractions may last for hours, days, or even weeks before true labor sets in. If contractions come at regular intervals, become more frequent, increase in intensity, or become painful, you should see your health care provider. What are the signs of labor?  Abdominal cramps.  Regular contractions that start at 10 minutes apart and become stronger and more frequent with time.  Contractions that start on the top of the uterus and spread down to the lower abdomen and back.  Increased pelvic pressure and dull back pain.  A watery or bloody mucus discharge that comes from the vagina.  Leaking of amniotic fluid. This is also known as your "water breaking." It could be a slow trickle or a gush. Let your health care provider know if it has a color or strange odor. If you have any of these signs, call your health care provider right away, even if it is before your due date. Follow these instructions at home: Medicines  Follow your health care provider's instructions regarding medicine use. Specific medicines may be either safe or unsafe to take during pregnancy.  Take a prenatal vitamin that contains at least 600 micrograms (mcg) of folic acid.  If you develop constipation, try taking a stool softener if your health care provider approves. Eating and drinking   Eat a balanced diet that includes fresh fruits and vegetables, whole grains, good sources of protein such as meat, eggs, or tofu,  and low-fat dairy. Your health care provider will help you determine the amount of weight gain that is right for you.  Avoid raw meat and uncooked cheese. These carry germs that can cause birth defects in the baby.  If you have low calcium intake from food, talk to your health care provider about whether you should take a daily calcium supplement.  Eat four or five small meals rather than three large meals a day.  Limit foods that are high in fat and processed sugars, such as fried and sweet foods.  To prevent constipation: ? Drink enough fluid to keep your urine clear or pale yellow. ? Eat foods that are high in fiber, such as fresh fruits and vegetables, whole grains, and beans. Activity  Exercise only as directed by your health care provider. Most women can continue their usual exercise routine during pregnancy. Try to exercise for 30 minutes at least 5 days a week. Stop exercising if you experience uterine contractions.  Avoid heavy lifting.  Do   not exercise in extreme heat or humidity, or at high altitudes.  Wear low-heel, comfortable shoes.  Practice good posture.  You may continue to have sex unless your health care provider tells you otherwise. Relieving pain and discomfort  Take frequent breaks and rest with your legs elevated if you have leg cramps or low back pain.  Take warm sitz baths to soothe any pain or discomfort caused by hemorrhoids. Use hemorrhoid cream if your health care provider approves.  Wear a good support bra to prevent discomfort from breast tenderness.  If you develop varicose veins: ? Wear support pantyhose or compression stockings as told by your healthcare provider. ? Elevate your feet for 15 minutes, 3-4 times a day. Prenatal care  Write down your questions. Take them to your prenatal visits.  Keep all your prenatal visits as told by your health care provider. This is important. Safety  Wear your seat belt at all times when driving.  Make  a list of emergency phone numbers, including numbers for family, friends, the hospital, and police and fire departments. General instructions  Avoid cat litter boxes and soil used by cats. These carry germs that can cause birth defects in the baby. If you have a cat, ask someone to clean the litter box for you.  Do not travel far distances unless it is absolutely necessary and only with the approval of your health care provider.  Do not use hot tubs, steam rooms, or saunas.  Do not drink alcohol.  Do not use any products that contain nicotine or tobacco, such as cigarettes and e-cigarettes. If you need help quitting, ask your health care provider.  Do not use any medicinal herbs or unprescribed drugs. These chemicals affect the formation and growth of the baby.  Do not douche or use tampons or scented sanitary pads.  Do not cross your legs for long periods of time.  To prepare for the arrival of your baby: ? Take prenatal classes to understand, practice, and ask questions about labor and delivery. ? Make a trial run to the hospital. ? Visit the hospital and tour the maternity area. ? Arrange for maternity or paternity leave through employers. ? Arrange for family and friends to take care of pets while you are in the hospital. ? Purchase a rear-facing car seat and make sure you know how to install it in your car. ? Pack your hospital bag. ? Prepare the baby's nursery. Make sure to remove all pillows and stuffed animals from the baby's crib to prevent suffocation.  Visit your dentist if you have not gone during your pregnancy. Use a soft toothbrush to brush your teeth and be gentle when you floss. Contact a health care provider if:  You are unsure if you are in labor or if your water has broken.  You become dizzy.  You have mild pelvic cramps, pelvic pressure, or nagging pain in your abdominal area.  You have lower back pain.  You have persistent nausea, vomiting, or  diarrhea.  You have an unusual or bad smelling vaginal discharge.  You have pain when you urinate. Get help right away if:  Your water breaks before 37 weeks.  You have regular contractions less than 5 minutes apart before 37 weeks.  You have a fever.  You are leaking fluid from your vagina.  You have spotting or bleeding from your vagina.  You have severe abdominal pain or cramping.  You have rapid weight loss or weight gain.  You have   shortness of breath with chest pain.  You notice sudden or extreme swelling of your face, hands, ankles, feet, or legs.  Your baby makes fewer than 10 movements in 2 hours.  You have severe headaches that do not go away when you take medicine.  You have vision changes. Summary  The third trimester is from week 28 through week 40, months 7 through 9. The third trimester is a time when the unborn baby (fetus) is growing rapidly.  During the third trimester, your discomfort may increase as you and your baby continue to gain weight. You may have abdominal, leg, and back pain, sleeping problems, and an increased need to urinate.  During the third trimester your breasts will keep growing and they will continue to become tender. A yellow fluid (colostrum) may leak from your breasts. This is the first milk you are producing for your baby.  False labor is a condition in which you feel small, irregular tightenings of the muscles in the womb (contractions) that eventually go away. These are called Braxton Hicks contractions. Contractions may last for hours, days, or even weeks before true labor sets in.  Signs of labor can include: abdominal cramps; regular contractions that start at 10 minutes apart and become stronger and more frequent with time; watery or bloody mucus discharge that comes from the vagina; increased pelvic pressure and dull back pain; and leaking of amniotic fluid. This information is not intended to replace advice given to you by your  health care provider. Make sure you discuss any questions you have with your health care provider. Document Released: 06/03/2001 Document Revised: 07/15/2016 Document Reviewed: 07/15/2016 Elsevier Interactive Patient Education  2019 [employer]  

## 2018-10-14 ENCOUNTER — Other Ambulatory Visit: Payer: Self-pay

## 2018-10-14 ENCOUNTER — Telehealth: Payer: Self-pay

## 2018-10-14 ENCOUNTER — Encounter: Payer: Self-pay | Admitting: Advanced Practice Midwife

## 2018-10-14 ENCOUNTER — Ambulatory Visit (INDEPENDENT_AMBULATORY_CARE_PROVIDER_SITE_OTHER): Payer: Medicaid Other | Admitting: Advanced Practice Midwife

## 2018-10-14 VITALS — BP 120/68 | Wt 313.0 lb

## 2018-10-14 DIAGNOSIS — Z3A30 30 weeks gestation of pregnancy: Secondary | ICD-10-CM

## 2018-10-14 DIAGNOSIS — O0990 Supervision of high risk pregnancy, unspecified, unspecified trimester: Secondary | ICD-10-CM

## 2018-10-14 DIAGNOSIS — O26893 Other specified pregnancy related conditions, third trimester: Secondary | ICD-10-CM

## 2018-10-14 DIAGNOSIS — R102 Pelvic and perineal pain: Secondary | ICD-10-CM

## 2018-10-14 DIAGNOSIS — N949 Unspecified condition associated with female genital organs and menstrual cycle: Secondary | ICD-10-CM

## 2018-10-14 LAB — POCT URINALYSIS DIPSTICK OB
Appearance: NORMAL
Bilirubin, UA: NEGATIVE
Blood, UA: NEGATIVE
Glucose, UA: NEGATIVE
Ketones, UA: NEGATIVE
Leukocytes, UA: NEGATIVE
Nitrite, UA: NEGATIVE
Odor: NORMAL
Spec Grav, UA: 1.025
Urobilinogen, UA: 0.2 U/dL
pH, UA: 5

## 2018-10-14 NOTE — Progress Notes (Signed)
Pelvic pressure intermittent past two weeks but increasing over past few days. Uncomfortable.

## 2018-10-14 NOTE — Telephone Encounter (Signed)
Pt called triage reporting pressure feels like something is going to fall out, also frequent urination, pt advised to come in and have cervical check and urine checked pt aware sara will call to schedule

## 2018-10-14 NOTE — Patient Instructions (Signed)
Pelvic Floor Dysfunction    Pelvic floor dysfunction (PFD) is a condition that results when the group of muscles and connective tissues that support the organs in the pelvis (pelvic floor muscles) do not work well. These muscles and their connections form a sling that supports the colon and bladder. In men, these muscles also support the prostate gland. In women, they also support the uterus.  PFD causes pelvic floor muscles to be too weak, too tight, or a combination of both. In PFD, muscle movements are not coordinated. This condition may cause bowel or bladder problems. It may also cause pain.  What are the causes?  This condition may be caused by an injury to the pelvic area or by a weakening of pelvic muscles. This often results from pregnancy and childbirth or other types of strain. In many cases, the exact cause is not known.  What increases the risk?  The following factors may make you more likely to develop this condition:  · Having a condition of chronic bladder tissue inflammation (interstitial cystitis).  · Being an older person.  · Being overweight.  · Radiation treatment for cancer in the pelvic region.  · Previous pelvic surgery, such as removal of the uterus (hysterectomy) or prostate gland (prostatectomy).  What are the signs or symptoms?  Symptoms of this condition vary and may include:  · Bladder symptoms, such as:  ? Trouble starting urination and emptying the bladder.  ? Frequent urinary tract infections.  ? Leaking urine when coughing, laughing, or exercising (stress incontinence).  ? Having to pass urine urgently or frequently.  ? Pain when passing urine.  · Bowel symptoms, such as:  ? Constipation.  ? Urgent or frequent bowel movements.  ? Incomplete bowel movements.  ? Painful bowel movements.  ? Leaking stool or gas.  · Unexplained genital or rectal pain.  · Genital or rectal muscle spasms.  · Low back pain.  In women, symptoms of PFD may also include:  · A heavy, full, or aching feeling in  the vagina.  · A bulge that protrudes into the vagina.  · Pain during or after sexual intercourse.  How is this diagnosed?  This condition may be diagnosed based on:  · Your symptoms and medical history.  · A physical exam. During the exam, your health care provider may check your pelvic muscles for tightness, spasm, pain, or weakness. This may include a rectal exam and a pelvic exam for women.  In some cases, you may have diagnostic tests, such as:  · Electrical muscle function tests.  · Urine flow testing.  · X-ray tests of bowel function.  · Ultrasound of the pelvic organs.  How is this treated?  Treatment for this condition depends on your symptoms. Treatment options include:  · Physical therapy. This may include Kegel exercises to help relax or strengthen the pelvic floor muscles.  · Biofeedback. This type of therapy provides feedback on how tight your pelvic floor muscles are so that you can learn to control them.  · Internal or external massage therapy.  · A treatment that involves electrical stimulation of the pelvic floor muscles to help control pain (transcutaneous electrical nerve stimulation, or TENS).  · Sound wave therapy (ultrasound) to reduce muscle spasms.  · Medicines, such as:  ? Muscle relaxants.  ? Bladder control medicines.  Surgery to reconstruct or support pelvic floor muscles may be an option if other treatments do not help.  Follow these instructions at home:  Activity  ·   Do your usual activities as told by your health care provider. Ask your health care provider if you should modify any activities.  · Do pelvic floor strengthening or relaxing exercises at home as told by your physical therapist.  Lifestyle  · Maintain a healthy weight.  · Eat foods that are high in fiber, such as beans, whole grains, and fresh fruits and vegetables.  · Limit foods that are high in fat and processed sugars, such as fried or sweet foods.  · Manage stress with relaxation techniques such as yoga or  meditation.  General instructions  · If you have problems with leakage:  ? Use absorbable pads or wear padded underwear.  ? Wash frequently with mild soap.  ? Keep your genital and anal area as clean and dry as possible.  ? Ask your health care provider if you should try a barrier cream to prevent skin irritation.  · Take warm baths to relieve pelvic muscle tension or spasms.  · Take over-the-counter and prescription medicines only as told by your health care provider.  · Keep all follow-up visits as told by your health care provider. This is important.  Contact a health care provider if you:  · Are not improving with home care.  · Have signs or symptoms of PFD that get worse at home.  · Develop new signs or symptoms at home.  · Have signs of a urinary tract infection, such as:  ? Fever.  ? Chills.  ? Urinary frequency.  ? A burning feeling when urinating.  · Have not had a bowel movement in 3 days (constipation).  Summary  · Pelvic floor dysfunction results when the muscles and connective tissues in your pelvic floor do not work well.  · These muscles and their connections form a sling that supports your colon and bladder. In men, these muscles also support the prostate gland. In women, they also support the uterus.  · PFD may be caused by an injury to the pelvic area or by a weakening of pelvic muscles.  · PFD causes pelvic floor muscles to be too weak, too tight, or a combination of both. Symptoms may vary from person to person.  · In most cases, PFD can be treated with physical therapies and medicines. Surgery may be an option if other treatments do not help.  This information is not intended to replace advice given to you by your health care provider. Make sure you discuss any questions you have with your health care provider.  Document Released: 12/28/2017 Document Revised: 12/28/2017 Document Reviewed: 12/28/2017  [employer]vier Interactive Patient Education © 2019 [employer]

## 2018-10-14 NOTE — Progress Notes (Signed)
Routine Prenatal Care Visit  Subjective  Donna Navarro is a 33 y.o. [id number] at [redacted]w[redacted]d being seen today for ongoing prenatal care.  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  ----------------------------------------------------------------------------------- Patient reports intermittent pelvic pressure that she first noticed 2 weeks ago and then again more frequently in the past 2 days. She describes it mostly as brief- a few to 30 seconds- pressure mainly on her bladder. She denies any burning, irritation, itching, discharge or odor. She denies contractions.   Contractions: Not present. Vag. Bleeding: None.  Movement: Present. Denies leaking of fluid.  ----------------------------------------------------------------------------------- The following portions of the patient's history were reviewed and updated as appropriate: allergies, current medications, past family history, past medical history, past social history, past surgical history and problem list. Problem list updated.   Objective  Blood pressure 120/68, weight (!) 313 lb (142 kg), last menstrual period 03/16/2018. Pregravid weight 289 lb (131.1 kg) Total Weight Gain 24 lb (10.9 kg) Urinalysis: Urine Protein Trace  Urine Glucose Negative  Fetal Status: Fetal Heart Rate (bpm): 145   Movement: Present     General:  Alert, oriented and cooperative. Patient is in no acute distress.  Skin: Skin is warm and dry. No rash noted.   Cardiovascular: Normal heart rate noted   Respiratory: Normal respiratory effort, no problems with respiration noted  Abdomen: Soft, gravid, appropriate for gestational age. Pain/Pressure: Present     Pelvic:  Cervical exam performed      mild bulging of bladder in vagina, cerclage appears to be intact, no cervical dilation  Extremities: Normal range of motion.     Mental Status: Normal mood and affect. Normal behavior. Normal judgment and thought content.   Assessment   33 y.o. 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 08/23/2018 by Conard Novak, MD No   Overview Signed 08/23/2018 12:07 PM by Conard Novak, MD    Treated at [facility] - retest at 28 weeks (RPR)      High-risk pregnancy supervision, unspecified trimester 05/17/2018 by Natale Milch, MD No   Overview Addendum 09/20/2018 12:35 PM by Natale Milch, MD      Clinic [facility] Prenatal Labs  Dating  T1 [place] Blood type: --/--/A POS (12/23 1305)   Genetic Screen NIPS:   Normal XX Antibody:NEG (12/23 1305)  Anatomic [place] complete Rubella: 2.87 (12/20 1636)  Varicella: Immune  GTT Early: hgba1c 5.4       28 wk:      RPR: Reactive (12/20 1636)  Treatment at [facility]  Rhogam  not needed HBsAg: Negative (12/20 1636)   TDaP vaccine                       HIV: Non Reactive (12/20 1636)   Flu Shot    Declines                            GBS:  Contraception  BTL- [ ]  consents Okay with IUD or Nexplanon after birth Pap: NIL 2019  CBB     CS/VBAC  not needed   Baby Food  bottle   Support Person   significant other- [name]           Cervical incompetence 05/17/2018 by Natale MilchSchuman, Christanna R, MD No   Hx of cerclage, currently pregnant 05/17/2018 by Natale MilchSchuman, Christanna R, MD No   Overview Addendum 06/28/2018  4:16 PM by Oswaldo ConroySchmid, Jacelyn Y, CNM    - cerclage after [redacted] weeks gestation [x]             Preterm labor symptoms and general obstetric precautions including but not limited to vaginal bleeding, contractions, leaking of fluid and fetal movement were reviewed in detail with the patient. [facility] for worsening symptoms/labor symptoms  Try Hands and Knees for relief of pressure feeling Please refer to After Visit Summary for other counseling recommendations.   Return for has follow up scheduled.  Tresea MallJane Rashada Klontz, CNM 10/14/2018 4:21 PM

## 2018-10-25 ENCOUNTER — Ambulatory Visit (INDEPENDENT_AMBULATORY_CARE_PROVIDER_SITE_OTHER): Payer: Medicaid Other | Admitting: Obstetrics and Gynecology

## 2018-10-25 ENCOUNTER — Encounter: Payer: Self-pay | Admitting: Obstetrics and Gynecology

## 2018-10-25 ENCOUNTER — Ambulatory Visit (INDEPENDENT_AMBULATORY_CARE_PROVIDER_SITE_OTHER): Payer: Medicaid Other

## 2018-10-25 ENCOUNTER — Other Ambulatory Visit: Payer: Self-pay

## 2018-10-25 VITALS — BP 110/62 | Wt 304.0 lb

## 2018-10-25 DIAGNOSIS — Z9889 Other specified postprocedural states: Secondary | ICD-10-CM

## 2018-10-25 DIAGNOSIS — K439 Ventral hernia without obstruction or gangrene: Secondary | ICD-10-CM

## 2018-10-25 DIAGNOSIS — Z362 Encounter for other antenatal screening follow-up: Secondary | ICD-10-CM

## 2018-10-25 DIAGNOSIS — O0990 Supervision of high risk pregnancy, unspecified, unspecified trimester: Secondary | ICD-10-CM

## 2018-10-25 DIAGNOSIS — Z6841 Body Mass Index (BMI) 40.0 and over, adult: Principal | ICD-10-CM

## 2018-10-25 DIAGNOSIS — O09293 Supervision of pregnancy with other poor reproductive or obstetric history, third trimester: Secondary | ICD-10-CM

## 2018-10-25 DIAGNOSIS — O09299 Supervision of pregnancy with other poor reproductive or obstetric history, unspecified trimester: Secondary | ICD-10-CM

## 2018-10-25 DIAGNOSIS — O98113 Syphilis complicating pregnancy, third trimester: Secondary | ICD-10-CM

## 2018-10-25 DIAGNOSIS — Z3A31 31 weeks gestation of pregnancy: Secondary | ICD-10-CM

## 2018-10-25 DIAGNOSIS — O99213 Obesity complicating pregnancy, third trimester: Secondary | ICD-10-CM

## 2018-10-25 NOTE — Progress Notes (Signed)
ROB C/o nausea/diherrea BT consent today/Tdaps are out  Good FM, no vb, no lof

## 2018-10-25 NOTE — Progress Notes (Signed)
Routine Prenatal Care Visit  Subjective  Donna Navarro is a 33 y.o. [id number] at [redacted]w[redacted]d being seen today for ongoing prenatal care.  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  ----------------------------------------------------------------------------------- Patient reports no complaints.   Contractions: Not present. Vag. Bleeding: None.  Movement: Present. Denies leaking of fluid.  ----------------------------------------------------------------------------------- The following portions of the patient's history were reviewed and updated as appropriate: allergies, current medications, past family history, past medical history, past social history, past surgical history and problem list. Problem list updated.   Objective  Blood pressure 110/62, weight (!) 304 lb (137.9 kg), last menstrual period 03/16/2018. Pregravid weight 289 lb (131.1 kg) Total Weight Gain 15 lb (6.804 kg) Urinalysis:      Fetal Status: Fetal Heart Rate (bpm): 127   Movement: Present     General:  Alert, oriented and cooperative. Patient is in no acute distress.  Skin: Skin is warm and dry. No rash noted.   Cardiovascular: Normal heart rate noted  Respiratory: Normal respiratory effort, no problems with respiration noted  Abdomen: Soft, gravid, appropriate for gestational age. Pain/Pressure: Absent     Pelvic:  Cervical exam deferred        Extremities: Normal range of motion.  Edema: None  Mental Status: Normal mood and affect. Normal behavior.  Normal judgment and thought content.     Assessment   33 y.o. 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 08/23/2018 by Conard Novak, MD No   Overview Signed 08/23/2018 12:07 PM by Conard Novak, MD    Treated at [facility] - retest at 28 weeks (RPR)      Obesity affecting pregnancy, antepartum, third trimester 08/09/2018 by Nadara Mustard, MD No   High-risk pregnancy supervision, unspecified trimester 05/17/2018 by Natale Milch, MD No   Overview Addendum 09/20/2018 12:35 PM by Natale Milch, MD      Clinic [facility] Prenatal Labs  Dating  T1 [place] Blood type: --/--/A POS (12/23 1305)   Genetic Screen NIPS:   Normal XX Antibody:NEG (12/23 1305)  Anatomic [place] complete Rubella: 2.87 (12/20 1636)  Varicella: Immune  GTT Early: hgba1c 5.4       28 wk:      RPR: Reactive (12/20 1636)  Treatment at [facility]  Rhogam  not needed HBsAg: Negative (12/20 1636)   TDaP vaccine                       HIV: Non Reactive (12/20 1636)   Flu Shot    Declines                            GBS:   Contraception  BTL- [ ]  consents Okay with IUD or Nexplanon after birth Pap: NIL 2019  CBB     CS/VBAC  not needed   Baby Food  bottle   Support Person   significant other- [name]  Cervical incompetence 05/17/2018 by Natale MilchSchuman, Christanna R, MD No   Hx of cerclage, currently pregnant 05/17/2018 by Natale MilchSchuman, Christanna R, MD No   Overview Addendum 06/28/2018  4:16 PM by Oswaldo ConroySchmid, Jacelyn Y, CNM    - cerclage after [redacted] weeks gestation [x]            Gestational age appropriate obstetric precautions including but not limited to vaginal bleeding, contractions, leaking of fluid and fetal movement were reviewed in detail with the patient.    Growth US WNL today RPR and CBC today  TDAP out in office, will need next  visit Tubal consent to be signed  At next visit, although patient understands she may not be able to have a tubal because of covid-19 outbreak.   Return in about 2 weeks (around 11/08/2018) for ROB in person, 4 week ROB & US in person with Dr. Jerene PitchSchuman.  Natale Milchhristanna R Schuman MD [facility], [facility] 10/25/2018, 10:01 AM

## 2018-10-25 NOTE — Patient Instructions (Signed)
 COVID-19 and Your Pregnancy FAQ  How can I prevent infection with COVID-19 during my pregnancy? Social distancing is key. Please limit any interactions in public. Try and work from home if possible. Frequently wash your hands after touching possibly contaminated surfaces. Avoid touching your face.  Minimize trips to the store. Consider online ordering when possible.   Should I wear a mask? YES. It is recommended by the CDC that all people wear a cloth mask or facial covering in public. You should wear a mask to your visits in the office. This will help reduce transmission as well as your risk or acquiring COVID-19. New studies are showing that even asymptomatic individuals can spread the virus from talking.   Where can I get a mask? [place] and the city of [place] are partnering to provide masks to community members. You can pick up a mask from several locations. This website also has instructions about how to make a mask by sewing or without sewing by using a t-shirt or bandana.  https://www.[place].gov/i-want-to/learn-about/covid-19-information-and-updates/covid-19-face-mask-project  Studies have shown that if you were a tube or nylon stocking from pantyhose over a cloth mask it makes the cloth mask almost as effective as a N95 mask.  https://www.npr.org/sections/goatsandsoda/2018/10/13/840146830/adding-a-nylon-stocking-layer-could-boost-protection-from-cloth-masks-study-find  What are the symptoms of COVID-19? Fever (greater than 100.4 F), dry cough, shortness of breath.  Am I more at risk for COVID-19 since I am pregnant? There is not currently data showing that pregnant women are more adversely impacted by COVID-19 than the general population. However, we know that pregnant women tend to have worse respiratory complications from similar diseases such as the flu and SARS and for this reason should be considered an at-risk population.  What do I do if I am experiencing the  symptoms of COVID-19? Testing is being limited because of test availability. If you are experiencing symptoms you should quarantine yourself, and the members of your family, for at least 2 weeks at home.   Please visit this website for more information: https://www.cdc.gov/coronavirus/2019-ncov/if-you-are-sick/steps-when-sick.html  When should I go to the Emergency Room? Please go to the emergency room if you are experiencing ANY of these symptoms*:  1.    Difficulty breathing or shortness of breath 2.    Persistent pain or pressure in the chest 3.    Confusion or difficulty being aroused (or awakened) 4.    Bluish lips or face  *This list is not all inclusive. Please consult our office for any other symptoms that are severe or concerning.  What do I do if I am having difficulty breathing? You should go to the Emergency Room for evaluation. At this time they have a tent set up for evaluating patients with COVID-19 symptoms.   How will my prenatal care be different because of the COVID-19 pandemic? It has been recommended to reduce the frequency of face-to-face visits and use resources such as telephone and virtual visits when possible. Using a scale, blood pressure machine and fetal doppler at home can further help reduce face-to-face visits. You will be provided with additional information on this topic.  We ask that you come to your visits alone to minimize potential exposures to  COVID-19.  How can I receive childbirth education? At this time in-person classes have been cancelled. You can register for online childbirth education, breastfeeding, and newborn care classes.  Please visit:  [email or web address] for more information  How will my hospital birth experience be different? The hospital is currently limiting visitors. This means that while you are in   labor you can only have one person at the hospital with you. Additional family members will not be allowed to wait in the  building or outside your room. Your one support person can be the father of the baby, a relative, a doula, or a friend. Once one support person is designated that person will wear a band. This band cannot be shared with multiple people.  Nitrous Gas is not being offered for pain relief since the tubing and filter for the machine can not be sanitized in a way to guarantee prevention of transmission of COVID-19.  Nasal cannula use of oxygen for fetal indications has also been discontinued.  Currently a clear plastic sheet is being hung between mom and the delivering provider during pushing and delivery to help prevent transmission of COVID-19.      How long will I stay in the hospital for after giving birth? It is also recommended that discharge home be expedited during the COVID-19 outbreak. This means staying for 1 day after a vaginal delivery and 2 days after a cesarean section. Patients who need to stay longer for medical reasons are allowed to do so, but the goal will be for expedited discharge home.   What if I have COVID-19 and I am in labor? We ask that you wear a mask while on labor and delivery. We will try and accommodate you being placed in a room that is capable of filtering the air. Please call ahead if you are in labor and on your way to the hospital. The phone number for labor and delivery at [facility] is [phone].  If I have COVID-19 when my baby is born how can I prevent my baby from contracting COVID-19? This is an issue that will have to be discussed on a case-by-case basis. Current recommendations suggest providing separate isolation rooms for both the mother and new infant as well as limiting visitors. However, there are practical challenges to this recommendation. The situation will assuredly change and decisions will be influenced by the desires of the mother and availability of space.  Some suggestions are the use of a curtain or physical barrier  between mom and infant, hand hygiene, mom wearing a mask, or 6 feet of spacing between a mom and infant.   Can I breastfeed during the COVID-19 pandemic?   Yes, breastfeeding is encouraged.  Can I breastfeed if I have COVID-19? Yes. Covid-19 has not been found in breast milk. This means you cannot give COVID-19 to your child through breast milk. Breast feeding will also help pass antibodies to fight infection to your baby.   What precautions should I take when breastfeeding if I have COVID-19? If a mother and newborn do room-in and the mother wishes to feed at the breast, she should put on a facemask and practice hand hygiene before each feeding.  What precautions should I take when pumping if I have COVID-19? Prior to expressing breast milk, mothers should practice hand hygiene. After each pumping session, all parts that come into contact with breast milk should be thoroughly washed and the entire pump should be appropriately disinfected per the manufacturer's instructions. This expressed breast milk should be fed to the newborn by a healthy caregiver.  What if I am pregnant and work in healthcare? Based on limited data regarding COVID-19 and pregnancy, ACOG currently does not propose creating additional restrictions on pregnant health care personnel because of COVID-19 alone. Pregnant women do not appear to be at higher   risk of severe disease related to COVID-19. Pregnant health care personnel should follow CDC risk assessment and infection control guidelines for health care personnel exposed to patients with suspected or confirmed COVID-19. Adherence to recommended infection prevention and control practices is an important part of protecting all health care personnel in health care settings.    Information on COVID-19 in pregnancy is very limited; however, facilities may want to consider limiting exposure of pregnant health care personnel to patients with confirmed or suspected COVID-19  infection, especially during higher-risk procedures (eg, aerosol-generating procedures), if feasible, based on staffing availability.    

## 2018-10-27 LAB — CBC
Hematocrit: 36.3 % (ref 34.0–46.6)
Hemoglobin: 11.7 g/dL (ref 11.1–15.9)
MCH: 28.2 pg (ref 26.6–33.0)
MCHC: 32.2 g/dL (ref 31.5–35.7)
MCV: 88 fL (ref 79–97)
Platelets: 361 10*3/uL (ref 150–450)
RBC: 4.15 x10E6/uL (ref 3.77–5.28)
RDW: 13.4 % (ref 11.7–15.4)
WBC: 16.9 10*3/uL — ABNORMAL HIGH (ref 3.4–10.8)

## 2018-10-27 LAB — RPR, QUANT+TP ABS (REFLEX)
Rapid Plasma Reagin, Quant: 1:8 {titer} — ABNORMAL HIGH
T Pallidum Abs: REACTIVE — AB

## 2018-10-27 LAB — RPR: RPR Ser Ql: REACTIVE — AB

## 2018-10-27 NOTE — Progress Notes (Signed)
Note sent to mychart.

## 2018-11-02 ENCOUNTER — Observation Stay
Admission: EM | Admit: 2018-11-02 | Discharge: 2018-11-02 | Disposition: A | Discharge status: Home / Self Care | Payer: Medicaid Other | Attending: Obstetrics and Gynecology | Admitting: Obstetrics and Gynecology

## 2018-11-02 ENCOUNTER — Other Ambulatory Visit: Payer: Self-pay

## 2018-11-02 DIAGNOSIS — M79604 Pain in right leg: Secondary | ICD-10-CM | POA: Diagnosis not present

## 2018-11-02 DIAGNOSIS — Z3A32 32 weeks gestation of pregnancy: Secondary | ICD-10-CM

## 2018-11-02 DIAGNOSIS — M549 Dorsalgia, unspecified: Secondary | ICD-10-CM

## 2018-11-02 DIAGNOSIS — O09299 Supervision of pregnancy with other poor reproductive or obstetric history, unspecified trimester: Secondary | ICD-10-CM

## 2018-11-02 DIAGNOSIS — O99213 Obesity complicating pregnancy, third trimester: Secondary | ICD-10-CM

## 2018-11-02 DIAGNOSIS — O98111 Syphilis complicating pregnancy, first trimester: Secondary | ICD-10-CM

## 2018-11-02 DIAGNOSIS — M79606 Pain in leg, unspecified: Secondary | ICD-10-CM | POA: Diagnosis present

## 2018-11-02 DIAGNOSIS — N883 Incompetence of cervix uteri: Secondary | ICD-10-CM

## 2018-11-02 DIAGNOSIS — O26893 Other specified pregnancy related conditions, third trimester: Secondary | ICD-10-CM

## 2018-11-02 DIAGNOSIS — Z9889 Other specified postprocedural states: Secondary | ICD-10-CM

## 2018-11-02 DIAGNOSIS — M5430 Sciatica, unspecified side: Secondary | ICD-10-CM | POA: Diagnosis present

## 2018-11-02 DIAGNOSIS — M545 Low back pain: Secondary | ICD-10-CM | POA: Diagnosis not present

## 2018-11-02 DIAGNOSIS — O0990 Supervision of high risk pregnancy, unspecified, unspecified trimester: Secondary | ICD-10-CM

## 2018-11-02 MED ORDER — CYCLOBENZAPRINE HCL 10 MG PO TABS: 10.0000 mg | ORAL_TABLET | Freq: Three times a day (TID) | ORAL | 0 refills | Status: DC | PRN

## 2018-11-02 MED ORDER — ACETAMINOPHEN 325 MG PO TABS: 650.0000 mg | ORAL_TABLET | ORAL | Status: DC | PRN

## 2018-11-02 NOTE — Discharge Summary (Signed)
Physician Final Progress Note  Patient ID: Donna Navarro MRN: [id number] DOB/AGE: 1985/11/19 33 y.o.  Admit date: 11/02/2018 Admitting provider: Vena Austria, MD Discharge date: 11/02/2018   Admission Diagnoses: Back and leg pain  Discharge Diagnoses:  Active Problems:   Sciatica  33 y.o. [id number] at [redacted]w[redacted]d presenting with lower back pain, shooting into her right leg.  Pain is positional, worse at the end of the day.  No contractions, some pressure.  Denies LOF, VB.  Positive fetal movement.  The patient does have a cerclage in place this pregnancy which was checked, cervix closed cerclage not on tension.  Fetal monitoring was reactive.  Straight leg test positive on right.  Patient with trial of flexeril.  Blood pressure 127/69, pulse 93, temperature 99.4 F (37.4 C), temperature source Oral, resp. rate 16, height 5\' 7"  (1.702 m), weight (!) 139.3 kg, last menstrual period 03/16/2018.  Consults: None  Significant Findings/ Diagnostic Studies: none  Procedures:   Baseline: 150 Variability: moderate Accelerations: present Decelerations: absent Tocometry: none The patient was monitored for 30 minutes, fetal heart rate tracing was deemed reactive, category I tracing,  Discharge Condition: good  Disposition: Discharge disposition: 01-Home or Self Care       Diet: Regular diet  Discharge Activity: Activity as tolerated  Discharge Instructions    Discharge activity:  No Restrictions   Complete by:  As directed    Discharge diet:  No restrictions   Complete by:  As directed    No sexual activity restrictions   Complete by:  As directed    Notify physician for a general feeling that "something is not right"   Complete by:  As directed    Notify physician for increase or change in vaginal discharge   Complete by:  As directed    Notify physician for intestinal cramps, with or without diarrhea, sometimes described as "gas pain"   Complete by:  As directed    Notify physician for leaking of fluid   Complete by:  As directed    Notify physician for low, dull backache, unrelieved by heat or Tylenol   Complete by:  As directed    Notify physician for menstrual like cramps   Complete by:  As directed    Notify physician for pelvic pressure   Complete by:  As directed    Notify physician for uterine contractions.  These may be painless and feel like the uterus is tightening or the baby is  "balling up"   Complete by:  As directed    Notify physician for vaginal bleeding   Complete by:  As directed    PRETERM LABOR:  Includes any of the follwing symptoms that occur between 20 - [redacted] weeks gestation.  If these symptoms are not stopped, preterm labor can result in preterm delivery, placing your baby at risk   Complete by:  As directed      Allergies as of 11/02/2018   No Known Allergies     Medication List    TAKE these medications   acetaminophen 325 MG tablet Commonly known as:  TYLENOL Take 650 mg by mouth every 6 (six) hours as needed.   calcium carbonate 500 MG chewable tablet Commonly known as:  TUMS - dosed in mg elemental calcium Chew 1 tablet by mouth daily.   cyclobenzaprine 10 MG tablet Commonly known as:  FLEXERIL Take 1 tablet (10 mg total) by mouth 3 (three) times daily as needed for muscle spasms.   prenatal vitamin w/FE,  FA 29-1 MG Chew chewable tablet Chew 1 tablet by mouth daily.        Total time spent taking care of this patient: 30 minutes  Signed: Vena Austriandreas Suzzette Gasparro 11/02/2018, 12:11 PM

## 2018-11-02 NOTE — OB Triage Note (Signed)
Pt G6P2, [redacted]w[redacted]d presents with c/o lower back pain and cramps that radiates down R leg. States pain started yesterday and remains intermittent. Denies LOF, vaginal bleeding, or decreased fetal movement. External monitors applied and assessing, initial FHT 171, VS WNL. Provider notified. Will continue to assess.

## 2018-11-08 ENCOUNTER — Other Ambulatory Visit: Payer: Self-pay

## 2018-11-08 ENCOUNTER — Ambulatory Visit (INDEPENDENT_AMBULATORY_CARE_PROVIDER_SITE_OTHER): Payer: Medicaid Other | Admitting: Obstetrics & Gynecology

## 2018-11-08 VITALS — BP 120/80 | Wt 316.0 lb

## 2018-11-08 DIAGNOSIS — Z23 Encounter for immunization: Secondary | ICD-10-CM | POA: Diagnosis not present

## 2018-11-08 DIAGNOSIS — O99213 Obesity complicating pregnancy, third trimester: Secondary | ICD-10-CM

## 2018-11-08 DIAGNOSIS — O0990 Supervision of high risk pregnancy, unspecified, unspecified trimester: Secondary | ICD-10-CM

## 2018-11-08 DIAGNOSIS — Z3A33 33 weeks gestation of pregnancy: Secondary | ICD-10-CM

## 2018-11-08 DIAGNOSIS — Z9889 Other specified postprocedural states: Secondary | ICD-10-CM

## 2018-11-08 DIAGNOSIS — O09293 Supervision of pregnancy with other poor reproductive or obstetric history, third trimester: Secondary | ICD-10-CM

## 2018-11-08 DIAGNOSIS — O09299 Supervision of pregnancy with other poor reproductive or obstetric history, unspecified trimester: Secondary | ICD-10-CM

## 2018-11-08 LAB — POCT URINALYSIS DIPSTICK OB
Glucose, UA: NEGATIVE
POC,PROTEIN,UA: NEGATIVE

## 2018-11-08 NOTE — Progress Notes (Signed)
  Subjective  Fetal Movement? yes Contractions? no Leaking Fluid? no Vaginal Bleeding? no  Objective  BP 120/80   Wt (!) 316 lb (143.3 kg)   LMP 03/16/2018 (Approximate)   BMI 49.49 kg/m  General: NAD Pumonary: no increased work of breathing Abdomen: gravid, non-tender Extremities: no edema Psychiatric: mood appropriate, affect full  Assessment  33 y.o. 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 08/23/2018 by Conard Novak, MD No   Overview Signed 08/23/2018 12:07 PM by Conard Novak, MD    Treated at [facility] - retest at 28 weeks (RPR)      Obesity affecting pregnancy, antepartum, third trimester  BMI >=40 [x ] early 1h gtt -  [x ] u/s for dating [ ]   [x ] nutritional goals [x ] folic acid 1mg  [x ] bASA (>12 weeks) [x ] Growth u/s 53 [x ], 36 weeks [ ]  [x ] NST/AFI weekly 36+ weeks (36[] , 37[] , 38[] , 39[] , 40[] ) [x ] IOL by 41 weeks (scheduled, prn [] )  08/09/2018 by Nadara Mustard, MD No   High-risk pregnancy supervision, unspecified trimester 05/17/2018 by Natale Milch, MD No   Overview Addendum 11/08/2018  8:53 AM by Nadara Mustard, MD      Clinic [facility] Prenatal Labs  Dating  T1 [place] Blood type: --/--/A POS (12/23 1305)   Genetic Screen NIPS:   Normal XX Antibody:NEG (12/23 1305)  Anatomic [place] complete Rubella: 2.87 (12/20 1636)  Varicella: Immune  GTT Early: hgba1c 5.4       28 wk:      RPR: Reactive (12/20 1636)  Treatment at [facility]  Rhogam  not needed HBsAg: Negative (12/20 1636)   TDaP vaccine       5/18                HIV: Non Reactive  (12/20 1636)   Flu Shot    Declines                            GBS:   Contraception  BTL- [x]  consents Okay with IUD or Nexplanon after birth Pap: NIL 2019  CBB  no   CS/VBAC  not needed   Baby Food  bottle   Support Person   significant other- [name]           Cervical incompetence 05/17/2018 by Natale Milch, MD No   Hx of cerclage, currently pregnant 05/17/2018 by Natale Milch, MD No       Annamarie Major, MD, [facility], [facility] 11/08/2018  9:01 AM

## 2018-11-08 NOTE — Patient Instructions (Signed)
Braxton Hicks Contractions Contractions of the uterus can occur throughout pregnancy, but they are not always a sign that you are in labor. You may have practice contractions called Braxton Hicks contractions. These false labor contractions are sometimes confused with true labor. What are Braxton Hicks contractions? Braxton Hicks contractions are tightening movements that occur in the muscles of the uterus before labor. Unlike true labor contractions, these contractions do not result in opening (dilation) and thinning of the cervix. Toward the end of pregnancy (32-34 weeks), Braxton Hicks contractions can happen more often and may become stronger. These contractions are sometimes difficult to tell apart from true labor because they can be very uncomfortable. You should not feel embarrassed if you go to the hospital with false labor. Sometimes, the only way to tell if you are in true labor is for your health care provider to look for changes in the cervix. The health care provider will do a physical exam and may monitor your contractions. If you are not in true labor, the exam should show that your cervix is not dilating and your water has not broken. If there are no other health problems associated with your pregnancy, it is completely safe for you to be sent home with false labor. You may continue to have Braxton Hicks contractions until you go into true labor. How to tell the difference between true labor and false labor True labor  Contractions last 30-70 seconds.  Contractions become very regular.  Discomfort is usually felt in the top of the uterus, and it spreads to the lower abdomen and low back.  Contractions do not go away with walking.  Contractions usually become more intense and increase in frequency.  The cervix dilates and gets thinner. False labor  Contractions are usually shorter and not as strong as true labor contractions.  Contractions are usually irregular.  Contractions  are often felt in the front of the lower abdomen and in the groin.  Contractions may go away when you walk around or change positions while lying down.  Contractions get weaker and are shorter-lasting as time goes on.  The cervix usually does not dilate or become thin. Follow these instructions at home:   Take over-the-counter and prescription medicines only as told by your health care provider.  Keep up with your usual exercises and follow other instructions from your health care provider.  Eat and drink lightly if you think you are going into labor.  If Braxton Hicks contractions are making you uncomfortable: ? Change your position from lying down or resting to walking, or change from walking to resting. ? Sit and rest in a tub of warm water. ? Drink enough fluid to keep your urine pale yellow. Dehydration may cause these contractions. ? Do slow and deep breathing several times an hour.  Keep all follow-up prenatal visits as told by your health care provider. This is important. Contact a health care provider if:  You have a fever.  You have continuous pain in your abdomen. Get help right away if:  Your contractions become stronger, more regular, and closer together.  You have fluid leaking or gushing from your vagina.  You pass blood-tinged mucus (bloody show).  You have bleeding from your vagina.  You have low back pain that you never had before.  You feel your baby's head pushing down and causing pelvic pressure.  Your baby is not moving inside you as much as it used to. Summary  Contractions that occur before labor are   called Braxton Hicks contractions, false labor, or practice contractions.  Braxton Hicks contractions are usually shorter, weaker, farther apart, and less regular than true labor contractions. True labor contractions usually become progressively stronger and regular, and they become more frequent.  Manage discomfort from Braxton Hicks contractions  by changing position, resting in a warm bath, drinking plenty of water, or practicing deep breathing. This information is not intended to replace advice given to you by your health care provider. Make sure you discuss any questions you have with your health care provider. Document Released: 10/23/2016 Document Revised: 03/24/2017 Document Reviewed: 10/23/2016 Elsevier Interactive Patient Education  2019 [employer]vier Inc.  

## 2018-11-16 ENCOUNTER — Telehealth: Payer: Self-pay

## 2018-11-16 NOTE — Telephone Encounter (Signed)
Pt is 35wks; sharpe pain, diarrhea, and vomiting. [phone]  Pt very upset as she was telling me what was going on.  Adv her to calm down and breathe.  Sxs started yesterday c pain and diarrhea; vomiting started this am.  Adv to let system rest - clear liquids today, bland diet tomorrow, and work up to reg diet as tolerated the next day.  Also adv may take Immodium, kaopectate, or donnagel PG.  Pt aware if she doesn't keep liquids down for 24hrs she needs to be seen.

## 2018-11-22 ENCOUNTER — Ambulatory Visit (INDEPENDENT_AMBULATORY_CARE_PROVIDER_SITE_OTHER): Payer: Medicaid Other

## 2018-11-22 ENCOUNTER — Other Ambulatory Visit: Payer: Self-pay

## 2018-11-22 ENCOUNTER — Ambulatory Visit (INDEPENDENT_AMBULATORY_CARE_PROVIDER_SITE_OTHER): Payer: Medicaid Other | Admitting: Obstetrics and Gynecology

## 2018-11-22 ENCOUNTER — Encounter: Payer: Self-pay | Admitting: Obstetrics and Gynecology

## 2018-11-22 VITALS — BP 130/82 | Wt 316.0 lb

## 2018-11-22 DIAGNOSIS — Z362 Encounter for other antenatal screening follow-up: Secondary | ICD-10-CM | POA: Diagnosis not present

## 2018-11-22 DIAGNOSIS — O0990 Supervision of high risk pregnancy, unspecified, unspecified trimester: Secondary | ICD-10-CM

## 2018-11-22 DIAGNOSIS — O99213 Obesity complicating pregnancy, third trimester: Secondary | ICD-10-CM | POA: Diagnosis not present

## 2018-11-22 DIAGNOSIS — O98113 Syphilis complicating pregnancy, third trimester: Secondary | ICD-10-CM | POA: Diagnosis not present

## 2018-11-22 DIAGNOSIS — K439 Ventral hernia without obstruction or gangrene: Secondary | ICD-10-CM | POA: Diagnosis not present

## 2018-11-22 DIAGNOSIS — Z3A39 39 weeks gestation of pregnancy: Secondary | ICD-10-CM

## 2018-11-22 DIAGNOSIS — Z6841 Body Mass Index (BMI) 40.0 and over, adult: Secondary | ICD-10-CM

## 2018-11-22 DIAGNOSIS — O98111 Syphilis complicating pregnancy, first trimester: Secondary | ICD-10-CM | POA: Diagnosis not present

## 2018-11-22 NOTE — Progress Notes (Signed)
Routine Prenatal Care Visit  Delayed chart closure.  Subjective  Donna Navarro is a 33 Navarro.o. [id number] at [redacted]w[redacted]d being seen today for ongoing prenatal care.  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  ----------------------------------------------------------------------------------- Patient reports no complaints.   Reports normal fetal movement. Denies contractions. Denies vaginal bleeding.  Denies leaking of fluid.  ----------------------------------------------------------------------------------- The following portions of the patient's history were reviewed and updated as appropriate: allergies, current medications, past family history, past medical history, past social history, past surgical history and problem list. Problem list updated.   Objective  Blood pressure 130/82, weight (!) 316 lb (143.3 kg), last menstrual period 03/16/2018, not currently breastfeeding. Pregravid weight 289 lb (131.1 kg) Total Weight Gain 27 lb (12.2 kg) Urinalysis:      Fetal Status: heart tones present  General:  Alert, oriented and cooperative. Patient is in no  acute distress.  Skin: Skin is warm and dry. No rash noted.   Cardiovascular: Normal heart rate noted  Respiratory: Normal respiratory effort, no problems with respiration noted  Abdomen: Soft, gravid, appropriate for gestational age.       Pelvic:  Cervical exam deferred        Extremities: Normal range of motion.     Mental Status: Normal mood and affect. Normal behavior. Normal judgment and thought content.     Assessment   60 Navarro.o. 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 08/23/2018 by Donna Novak, MD No   Overview Signed 08/23/2018 12:07 PM by Donna Novak, MD    Treated at [facility] - retest at 28 weeks (RPR)      Obesity affecting pregnancy, antepartum, third trimester 08/09/2018 by Donna Mustard, MD No   High-risk pregnancy supervision, unspecified trimester 05/17/2018 by Donna Milch, MD No   Overview Addendum 12/15/2018  8:50 AM by Donna Navarro, CNM      Clinic [facility] Prenatal Labs  Dating  T1 [place] Blood type: --/--/A POS (12/23 1305)   Genetic Screen NIPS:   Normal XX Antibody:NEG (12/23 1305)  Anatomic [place] complete Rubella: 2.87 (12/20 1636)  Varicella: Immune  GTT Early: hgba1c 5.4       28 wk:  158, 3hr 1 elevated value    RPR: Reactive (12/20 1636)  Treatment at [facility]  Rhogam  not needed HBsAg: Negative (12/20 1636)   TDaP vaccine       5/18                HIV: Non Reactive (12/20 1636)   Flu Shot    Declines  GBS:   Contraception  BTL- [x]  consents Okay with IUD or Nexplanon after birth Pap: NIL 2019  CBB  no   CS/VBAC  not needed   Baby Food  bottle   Support Person   significant other- Donna Navarro           Previous Version   Cervical incompetence 05/17/2018 by  Donna Navarro,  R, MD No   Hx of cerclage, currently pregnant 05/17/2018 by Donna Navarro,  R, MD No   Overview Addendum 06/28/2018  4:16 PM by Donna Navarro, Donna Navarro, CNM    - cerclage after [redacted] weeks gestation [x]        Previous Version       Gestational age appropriate obstetric precautions including but not limited to vaginal bleeding, contractions, leaking of fluid and fetal movement were reviewed in detail with the patient.    Return for ROB in office/ NST& AFI, cerclage removal- schedule with  .  Donna Milchhristanna R  MD Donna Navarro, Donna Navarro 08/27/2020, 9:08 AM

## 2018-11-22 NOTE — Progress Notes (Signed)
ROB/NST C/o pelvic pain/pressure

## 2018-11-29 ENCOUNTER — Ambulatory Visit (INDEPENDENT_AMBULATORY_CARE_PROVIDER_SITE_OTHER): Payer: Medicaid Other

## 2018-11-29 ENCOUNTER — Ambulatory Visit (INDEPENDENT_AMBULATORY_CARE_PROVIDER_SITE_OTHER): Payer: Medicaid Other | Admitting: Obstetrics and Gynecology

## 2018-11-29 ENCOUNTER — Encounter: Payer: Self-pay | Admitting: Obstetrics and Gynecology

## 2018-11-29 ENCOUNTER — Encounter: Payer: Medicaid Other | Admitting: Obstetrics and Gynecology

## 2018-11-29 ENCOUNTER — Other Ambulatory Visit (HOSPITAL_COMMUNITY)
Admission: RE | Admit: 2018-11-29 | Discharge: 2018-11-29 | Disposition: A | Discharge status: Home / Self Care | Payer: Medicaid Other | Source: Ambulatory Visit | Attending: Obstetrics and Gynecology | Admitting: Obstetrics and Gynecology

## 2018-11-29 ENCOUNTER — Other Ambulatory Visit: Payer: Self-pay

## 2018-11-29 VITALS — BP 138/90 | Wt 325.0 lb

## 2018-11-29 DIAGNOSIS — O09293 Supervision of pregnancy with other poor reproductive or obstetric history, third trimester: Secondary | ICD-10-CM | POA: Diagnosis not present

## 2018-11-29 DIAGNOSIS — Z3689 Encounter for other specified antenatal screening: Secondary | ICD-10-CM

## 2018-11-29 DIAGNOSIS — O0990 Supervision of high risk pregnancy, unspecified, unspecified trimester: Secondary | ICD-10-CM | POA: Insufficient documentation

## 2018-11-29 DIAGNOSIS — Z3A36 36 weeks gestation of pregnancy: Secondary | ICD-10-CM

## 2018-11-29 DIAGNOSIS — K439 Ventral hernia without obstruction or gangrene: Secondary | ICD-10-CM

## 2018-11-29 DIAGNOSIS — O09299 Supervision of pregnancy with other poor reproductive or obstetric history, unspecified trimester: Secondary | ICD-10-CM

## 2018-11-29 DIAGNOSIS — O0993 Supervision of high risk pregnancy, unspecified, third trimester: Secondary | ICD-10-CM

## 2018-11-29 DIAGNOSIS — O98111 Syphilis complicating pregnancy, first trimester: Secondary | ICD-10-CM

## 2018-11-29 DIAGNOSIS — O98113 Syphilis complicating pregnancy, third trimester: Secondary | ICD-10-CM

## 2018-11-29 DIAGNOSIS — Z9889 Other specified postprocedural states: Secondary | ICD-10-CM

## 2018-11-29 DIAGNOSIS — Z6841 Body Mass Index (BMI) 40.0 and over, adult: Secondary | ICD-10-CM

## 2018-11-29 DIAGNOSIS — N883 Incompetence of cervix uteri: Secondary | ICD-10-CM

## 2018-11-29 DIAGNOSIS — O99213 Obesity complicating pregnancy, third trimester: Secondary | ICD-10-CM

## 2018-11-29 LAB — POCT URINALYSIS DIPSTICK OB: Glucose, UA: NEGATIVE

## 2018-11-29 NOTE — Progress Notes (Signed)
ROB AFI/NST Cerclage removal GBS/Aptima

## 2018-11-29 NOTE — Patient Instructions (Signed)

## 2018-11-29 NOTE — Progress Notes (Signed)
Routine Prenatal Care Visit  Subjective  Donna Navarro is a 33 y.o. [id number] at [redacted]w[redacted]d being seen today for ongoing prenatal care.  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  ----------------------------------------------------------------------------------- Patient reports backache.   Contractions: Not present. Vag. Bleeding: None.  Movement: Present. Denies leaking of fluid.  ----------------------------------------------------------------------------------- The following portions of the patient's history were reviewed and updated as appropriate: allergies, current medications, past family history, past medical history, past social history, past surgical history and problem list. Problem list updated.   Objective  Blood pressure 138/90, weight (!) 325 lb (147.4 kg), last menstrual period 03/16/2018. Pregravid weight 289 lb (131.1 kg) Total Weight Gain 36 lb (16.3 kg) Urinalysis:      Fetal Status: Fetal Heart Rate (bpm): 140 Fundal Height: 40 cm Movement: Present     General:  Alert, oriented and cooperative. Patient is in no acute distress.  Skin: Skin is warm and dry. No rash noted.   Cardiovascular: Normal heart rate noted  Respiratory: Normal respiratory effort, no problems with respiration noted  Abdomen: Soft, gravid, appropriate for gestational age. Pain/Pressure: Present     Pelvic:  Cervical exam deferred       Cerclage removed, intact suture, intact button  Extremities: Normal range of motion.      Mental Status: Normal mood and affect. Normal behavior. Normal judgment and thought content.   NST: 135 bpm baseline, moderate variability, 15x15 accelerations, no decelerations.   Assessment   33 y.o. 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 08/23/2018 by Will Bonnet, MD No   Overview Signed 08/23/2018 12:07 PM by Will Bonnet, MD    Treated at [facility] - retest at 28 weeks (RPR)      Obesity affecting pregnancy, antepartum, third trimester 08/09/2018 by Gae Dry, MD No   High-risk pregnancy supervision, unspecified trimester 05/17/2018 by Homero Fellers, MD No   Overview Addendum 11/08/2018  8:53 AM by Gae Dry, MD      Clinic [facility] Prenatal Labs  Dating  T1 [place] Blood type: --/--/A POS (12/23 1305)   Genetic Screen NIPS:   Normal XX Antibody:NEG (12/23 1305)  Anatomic [place] complete Rubella: 2.87 (12/20 1636)  Varicella: Immune  GTT Early: hgba1c 5.4       28 wk:      RPR: Reactive (12/20 1636)  Treatment at [facility]  Rhogam  not needed HBsAg: Negative (12/20 1636)   TDaP vaccine       5/18                HIV: Non Reactive (12/20 1636)   Flu Shot    Declines                            GBS:   Contraception  BTL- [x]  consents Okay with IUD or Nexplanon after birth Pap: NIL 2019  CBB  no   CS/VBAC  not needed   Baby Food  bottle   Support Person   significant other- [name]           Cervical incompetence 05/17/2018 by Natale MilchSchuman, [place]i Coupland R, MD No   Hx of cerclage, currently pregnant 05/17/2018 by Natale MilchSchuman, Oakley Kossman R, MD No   Overview Addendum 06/28/2018  4:16 PM by Oswaldo ConroySchmid, Jacelyn Y, CNM    - cerclage after [redacted] weeks gestation [x]            Gestational age appropriate obstetric precautions including but not limited to vaginal bleeding, contractions, leaking of  fluid and fetal movement were reviewed in detail with the patient.    BP slightly elevated, patient reports occasional headaches at home. Will see in 3 days for repeat BP check. Cerclage removed today, GBS, GC/CT collected NST reactive, AFI normal Anesthesia referral Discussed that ppBTL not possible with hernia, will need IUD or nexplanon until tubal can be done Referral for ventral hernia placed.   Return in about 3 days (around 12/02/2018) for rob, NST.  Natale Milchhristanna R Berma Harts MD [facility], [facility], [facility] 11/29/2018, 9:25 AM

## 2018-11-30 ENCOUNTER — Encounter
Admission: RE | Admit: 2018-11-30 | Discharge: 2018-11-30 | Disposition: A | Discharge status: Home / Self Care | Payer: Medicaid Other | Source: Ambulatory Visit | Attending: Anesthesiology | Admitting: Anesthesiology

## 2018-11-30 LAB — CERVICOVAGINAL ANCILLARY ONLY
Chlamydia: NEGATIVE
Neisseria Gonorrhea: NEGATIVE

## 2018-11-30 NOTE — Consult Note (Signed)
[facility] Anesthesia Consultation  Unknown FoleyJessica N Vanvoorhis NWG:[id number] DOB: [birth year] DOA: 11/30/2018 [facility]   Requesting physician: Dr. Jerene PitchSchuman Date of consultation: 11/30/18 Reason for consultation: Obesity during pregnancy  CHIEF COMPLAINT:  Obesity during pregnancy  HISTORY OF PRESENT ILLNESS: Diona FoleyJessica Fraser  is a 33 y.o. female with a known history of obesity during pregnancy. This is her sixth pregnancy, has hx of stillborn birth and need for cerclage during pregnancy. Had her cerclage removed yesterday. Hx of chronic hypertension, has not needed medication during pregnancy. Denies hx of asthma. Denies personal or family hx of bleeding disorders. Has had epidural with prior deliveries.  PAST MEDICAL HISTORY:   Past Medical History:  Diagnosis Date  . GERD (gastroesophageal reflux disease)    with pregnancy-no meds  . Hypertension    lost weight and pcp took pt off bp meds 7 months  . Irregular periods   . Morbid obesity (HCC) 04/30/2017  . Obesity affecting pregnancy in second trimester 08/09/2018  . Ventral hernia without obstruction or gangrene 04/30/2017    PAST SURGICAL HISTORY:  Past Surgical History:  Procedure Laterality Date  . CERVICAL CERCLAGE N/A 06/17/2018   Procedure: CERCLAGE CERVICAL;  Surgeon: Natale MilchSchuman, Christanna R, MD;  Location: [facility] ORS;  Service: Gynecology;  Laterality: N/A;  . CHOLECYSTECTOMY      SOCIAL HISTORY:  Social History   Tobacco Use  . Smoking status: Never Smoker  . Smokeless tobacco: Never Used  Substance Use Topics  . Alcohol use: Never    Frequency: Never    FAMILY HISTORY:  Family History  Problem Relation Age of Onset  . Heart disease Mother        Massive Heart Attack    DRUG ALLERGIES: No Known Allergies  REVIEW OF SYSTEMS:   RESPIRATORY: No cough, shortness of breath, wheezing.  CARDIOVASCULAR: No chest pain, orthopnea, edema.  HEMATOLOGY: No anemia, easy  bruising or bleeding SKIN: No rash or lesion. NEUROLOGIC: No tingling, numbness, weakness.  PSYCHIATRY: No anxiety or depression.   MEDICATIONS AT HOME:  Prior to Admission medications   Medication Sig Start Date End Date Taking? Authorizing Provider  acetaminophen (TYLENOL) 325 MG tablet Take 650 mg by mouth every 6 (six) hours as needed.    [provider]  calcium carbonate (TUMS - DOSED IN MG ELEMENTAL CALCIUM) 500 MG chewable tablet Chew 1 tablet by mouth daily.    [provider]  cyclobenzaprine (FLEXERIL) 10 MG tablet Take 1 tablet (10 mg total) by mouth 3 (three) times daily as needed for muscle spasms. Patient not taking: Reported on 11/22/2018 11/02/18   Vena AustriaStaebler, Andreas, MD  prenatal vitamin w/FE, FA (NATACHEW) 29-1 MG CHEW chewable tablet Chew 1 tablet by mouth daily.     [provider]      PHYSICAL EXAMINATION:   VITAL SIGNS: Blood pressure (!) 142/88, pulse 90, temperature 37.1 C, temperature source Oral, height 5\' 7"  (1.702 m), weight (!) 146.6 kg, last menstrual period 03/16/2018, SpO2 100 %.  GENERAL:  33 y.o.-year-old patient no acute distress.  HEENT: Head atraumatic, normocephalic. Oropharynx and nasopharynx clear. MP 2, TM distance >3 cm, normal mouth opening. LUNGS: Normal breath sounds bilaterally, no wheezing, rales,rhonchi. No use of accessory muscles of respiration.  CARDIOVASCULAR: S1, S2 normal. No murmurs, rubs, or gallops.  EXTREMITIES: No pedal edema, cyanosis, or clubbing.  NEUROLOGIC: normal gait PSYCHIATRIC: The patient is alert and oriented x 3.  SKIN: No obvious rash, lesion, or ulcer.  IMPRESSION AND PLAN:   Kindsey Eblin  is a 33 y.o. female presenting with obesity during pregnancy. BMI is currently 50 at [redacted] weeks gestation.   Airway exam reassuring, also intubated during this pregnancy for cerclage placement without difficulty. Significant back adiposity but midline easily identified and interspaces palpable.    We discussed analgesic options during labor including epidural analgesia. Discussed that in obesity there can be increased difficulty with epidural placement or even failure of successful epidural. We also discussed that even after successful epidural placement there is increased risk of catheter migration out of the epidural space that would require catheter replacement. Discussed use of epidural vs spinal vs GA if cesarean delivery is required. Discussed increased risk of difficult intubation during pregnancy should an emergency cesarean delivery be required.   Plan for delivery at [facility].

## 2018-12-02 ENCOUNTER — Ambulatory Visit (INDEPENDENT_AMBULATORY_CARE_PROVIDER_SITE_OTHER): Payer: Medicaid Other | Admitting: Obstetrics and Gynecology

## 2018-12-02 ENCOUNTER — Encounter: Payer: Self-pay | Admitting: Obstetrics and Gynecology

## 2018-12-02 ENCOUNTER — Other Ambulatory Visit: Payer: Self-pay

## 2018-12-02 VITALS — BP 138/88 | Wt 326.0 lb

## 2018-12-02 DIAGNOSIS — Z9889 Other specified postprocedural states: Secondary | ICD-10-CM

## 2018-12-02 DIAGNOSIS — O09293 Supervision of pregnancy with other poor reproductive or obstetric history, third trimester: Secondary | ICD-10-CM | POA: Diagnosis not present

## 2018-12-02 DIAGNOSIS — Z3A37 37 weeks gestation of pregnancy: Secondary | ICD-10-CM | POA: Diagnosis not present

## 2018-12-02 DIAGNOSIS — O09299 Supervision of pregnancy with other poor reproductive or obstetric history, unspecified trimester: Secondary | ICD-10-CM

## 2018-12-02 DIAGNOSIS — O98111 Syphilis complicating pregnancy, first trimester: Secondary | ICD-10-CM

## 2018-12-02 DIAGNOSIS — O0990 Supervision of high risk pregnancy, unspecified, unspecified trimester: Secondary | ICD-10-CM

## 2018-12-02 DIAGNOSIS — Z6841 Body Mass Index (BMI) 40.0 and over, adult: Secondary | ICD-10-CM

## 2018-12-02 DIAGNOSIS — O99213 Obesity complicating pregnancy, third trimester: Secondary | ICD-10-CM

## 2018-12-02 DIAGNOSIS — O98113 Syphilis complicating pregnancy, third trimester: Secondary | ICD-10-CM

## 2018-12-02 DIAGNOSIS — O0993 Supervision of high risk pregnancy, unspecified, third trimester: Secondary | ICD-10-CM

## 2018-12-02 LAB — POCT URINALYSIS DIPSTICK OB: Glucose, UA: NEGATIVE

## 2018-12-02 NOTE — Progress Notes (Signed)
ROB/NST C/o pelvic pressure

## 2018-12-02 NOTE — Progress Notes (Signed)
Routine Prenatal Care Visit  Subjective  Donna Navarro is a 33 y.o. [id number] at [redacted]w[redacted]d being seen today for ongoing prenatal care.  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  ----------------------------------------------------------------------------------- Patient reports no complaints.   Contractions: Not present. Vag. Bleeding: None.  Movement: Present. Denies leaking of fluid.  ----------------------------------------------------------------------------------- The following portions of the patient's history were reviewed and updated as appropriate: allergies, current medications, past family history, past medical history, past social history, past surgical history and problem list. Problem list updated.   Objective  Blood pressure 138/88, weight (!) 326 lb (147.9 kg), last menstrual period 03/16/2018. Pregravid weight 289 lb (131.1 kg) Total Weight Gain 37 lb (16.8 kg) Urinalysis:      Fetal Status: Fetal Heart Rate (bpm): 135 Fundal Height: 40 cm Movement: Present  Presentation: Vertex  General:  Alert, oriented and cooperative. Patient is in no acute distress.  Skin: Skin is warm and dry. No rash noted.   Cardiovascular: Normal heart rate noted  Respiratory: Normal respiratory effort, no problems with respiration noted  Abdomen: Soft, gravid, appropriate for gestational age. Pain/Pressure: Present     Pelvic:  Cervical exam performed Dilation: 1 Effacement (%): 50 Station: -3  Extremities: Normal  range of motion.  Edema: Trace  Mental Status: Normal mood and affect. Normal behavior. Normal judgment and thought content.     Assessment   33 y.o. 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 08/23/2018 by Will Bonnet, MD No   Overview Signed 08/23/2018 12:07 PM by Will Bonnet, MD    Treated at [facility] - retest at 28 weeks (RPR)      Obesity affecting pregnancy, antepartum, third trimester 08/09/2018 by Gae Dry, MD No   High-risk pregnancy supervision, unspecified trimester 05/17/2018 by Homero Fellers, MD No   Overview Addendum 11/29/2018  9:46 AM by Homero Fellers, [place] Prenatal Labs  Dating  T1 [place] Blood type: --/--/A POS (12/23 1305)   Genetic Screen NIPS:   Normal XX Antibody:NEG (12/23 1305)  Anatomic [place] complete Rubella: 2.87 (12/20 1636)  Varicella: Immune  GTT Early: hgba1c 5.4       28 wk:  158, 3hr 1 elevated value    RPR: Reactive (12/20 1636)  Treatment at [place]  not needed HBsAg: Negative (12/20 1636)   TDaP vaccine       5/18                HIV: Non Reactive (12/20 1636)   Flu Shot    Declines                            GBS:   Contraception  BTL- [x]  consents Okay with IUD or Nexplanon after birth Pap: NIL 2019  CBB  no   CS/VBAC  not needed   Baby Food  bottle  Support Person   significant other- [name]           Cervical incompetence 05/17/2018 by Natale MilchSchuman, Danni Leabo R, MD No   Hx of cerclage, currently pregnant 05/17/2018 by Natale MilchSchuman, Carolanne Mercier R, MD No   Overview Addendum 06/28/2018  4:16 PM by Oswaldo ConroySchmid, Jacelyn Y, CNM    - cerclage after [redacted] weeks gestation [x]           NST: 130 bpm baseline, moderate variability, 15x15 accelerations, no decelerations.  Gestational age appropriate obstetric precautions including but  not limited to vaginal bleeding, contractions, leaking of fluid and fetal movement were reviewed in detail with the patient.    Return in about 1 week (around 12/09/2018) for ROB/NST/US.  Natale Milchhristanna R Tatem Fesler MD [facility], [facility] 12/02/2018, 11:16 AM

## 2018-12-03 ENCOUNTER — Observation Stay
Admission: EM | Admit: 2018-12-03 | Discharge: 2018-12-03 | Disposition: A | Discharge status: Home / Self Care | Payer: Medicaid Other | Attending: Certified Nurse Midwife | Admitting: Certified Nurse Midwife

## 2018-12-03 ENCOUNTER — Other Ambulatory Visit: Payer: Self-pay

## 2018-12-03 ENCOUNTER — Encounter: Payer: Self-pay | Admitting: Certified Nurse Midwife

## 2018-12-03 DIAGNOSIS — M5430 Sciatica, unspecified side: Secondary | ICD-10-CM | POA: Insufficient documentation

## 2018-12-03 DIAGNOSIS — N883 Incompetence of cervix uteri: Secondary | ICD-10-CM

## 2018-12-03 DIAGNOSIS — O3433 Maternal care for cervical incompetence, third trimester: Secondary | ICD-10-CM | POA: Diagnosis not present

## 2018-12-03 DIAGNOSIS — R1030 Lower abdominal pain, unspecified: Secondary | ICD-10-CM | POA: Insufficient documentation

## 2018-12-03 DIAGNOSIS — Z3A37 37 weeks gestation of pregnancy: Secondary | ICD-10-CM | POA: Diagnosis not present

## 2018-12-03 DIAGNOSIS — O98111 Syphilis complicating pregnancy, first trimester: Secondary | ICD-10-CM

## 2018-12-03 DIAGNOSIS — O26893 Other specified pregnancy related conditions, third trimester: Secondary | ICD-10-CM | POA: Diagnosis not present

## 2018-12-03 DIAGNOSIS — O09299 Supervision of pregnancy with other poor reproductive or obstetric history, unspecified trimester: Secondary | ICD-10-CM

## 2018-12-03 DIAGNOSIS — K439 Ventral hernia without obstruction or gangrene: Secondary | ICD-10-CM | POA: Insufficient documentation

## 2018-12-03 DIAGNOSIS — Z9889 Other specified postprocedural states: Secondary | ICD-10-CM

## 2018-12-03 DIAGNOSIS — R109 Unspecified abdominal pain: Secondary | ICD-10-CM

## 2018-12-03 DIAGNOSIS — O99213 Obesity complicating pregnancy, third trimester: Secondary | ICD-10-CM | POA: Diagnosis not present

## 2018-12-03 DIAGNOSIS — O0990 Supervision of high risk pregnancy, unspecified, unspecified trimester: Secondary | ICD-10-CM

## 2018-12-03 DIAGNOSIS — O26899 Other specified pregnancy related conditions, unspecified trimester: Secondary | ICD-10-CM | POA: Diagnosis present

## 2018-12-03 LAB — CULTURE, BETA STREP (GROUP B ONLY): Strep Gp B Culture: POSITIVE — AB

## 2018-12-03 NOTE — Progress Notes (Signed)
Positive, released to [employer]

## 2018-12-03 NOTE — Progress Notes (Signed)
Received discharge orders from provider, provided pt with education regarding signs of labor and to call with questions/concerns. Pt. Verbalized understanding, discharged home.

## 2018-12-03 NOTE — Discharge Instructions (Signed)
Please return for regular contractions every 5 minutes or less  x1 hour, vaginal bleeding like a period, leaking water from the vagina, or decreased fetal movement. Labor and Delivery [phone]

## 2018-12-03 NOTE — OB Triage Note (Signed)
Pt 33 yo, [id number], [redacted]w[redacted]d, presents w/ c/o lower abdominal cramping that began this morning. Denies LOF, vaginal discharge, or decreased fetal movement. Vitals stable, monitors applied and assessing.

## 2018-12-03 NOTE — Final Progress Note (Signed)
Physician Final Progress Note  Patient ID: [name] MRN: [id number] DOB/AGE: 1985-09-02 33 y.o.  Admit date: 12/03/2018 Admitting provider: Vena AustriaAndreas Staebler, MD/ Donna Navarro, CNM Discharge date: 12/03/2018   Admission Diagnoses: IUP at 37wk2d with cramping in lower abdomen  Discharge Diagnoses:  Same as above  Consults: None  Significant Findings/ Diagnostic Studies: 33 year old G6 P2122 at 37.[redacted] weeks gestation [facility]. She has been having lower abdominal cramping since this AM. No vaginal bleeding, leakage of fluid. Baby active. Had her cerclage removed 11/29/2018. Cervix on 6/11 was 1/50%/-3. Her prenatal care has also been complicated by a hx of an incompetent cervix, a large ventral hernia, morbid obesity with current BMI of 50 kg/m2, sciatica, and syphilis in the first trimester (treated at [facility]).    Prenatal care was also remarkable for   [facility] Prenatal Labs  Dating  T1 US Blood type: --/--/A POS (12/23 1305)   Genetic Screen NIPS:   Normal XX Antibody:NEG (12/23 1305)  Anatomic US complete Rubella: 2.87 (12/20 1636)  Varicella: Immune  GTT Early: hgba1c 5.4       28 wk:  158, 3hr 1 elevated value    RPR: Reactive (12/20 1636) Titer 1:4 with +TP Treatment at [facility] RPR titer at 28 weeks was 1:8 RPR titer at  32 weeks was 1:8  Rhogam  not needed HBsAg: Negative (12/20 1636)   TDaP vaccine       5/18                HIV: Non Reactive (12/20 1636)   Flu Shot    Declines                            GBS:   Contraception  BTL- [x]  consents Okay with IUD or Nexplanon after birth Pap: NIL 2019  CBB  no   CS/VBAC  not needed   Baby Food  bottle   Support Person   significant other- [name]     Exam: Vital signs: BP 128/74 (BP Location: Left Arm)   Pulse (!) 107   Temp 98 F (36.7 C) (Oral)   Resp 18   LMP 03/16/2018 (Approximate)    General: gravid BF in NAD Abdomen: large ventral hernia to right of  midline. Ultrasound: OP presentation FHR: 140s with accelerations to 160s, moderate variability Toco: some irritability, 2 contractions in over 1 hour of monitoring  Cervix: 1/60%/-2 to -3  A: IUP at 37wk2d. Not in labor P; Discharge home with labor precautions  Procedures: none  Discharge Condition: stable  Disposition: Discharge disposition: 01-Home or Self Care       Diet: Regular diet  Discharge Activity: Activity as tolerated   Allergies as of 12/03/2018   No Known Allergies     Medication List    TAKE these medications   acetaminophen 325 MG tablet Commonly known as: TYLENOL Take 650 mg by mouth every 6 (six) hours as needed.   calcium carbonate 500 MG chewable tablet Commonly known as: TUMS - dosed in mg elemental calcium Chew 1 tablet by mouth daily.   cyclobenzaprine 10 MG tablet Commonly known as: FLEXERIL Take 1 tablet (10 mg total) by mouth 3 (three) times daily as needed for muscle spasms.   prenatal vitamin w/FE, FA 29-1 MG Chew chewable tablet Chew 1 tablet by mouth daily.        Total time spent  taking care of this patient: 15 minutes  Signed: Dalia Heading 12/03/2018, 3:12 PM

## 2018-12-04 ENCOUNTER — Other Ambulatory Visit: Payer: Self-pay

## 2018-12-04 ENCOUNTER — Observation Stay
Admission: EM | Admit: 2018-12-04 | Discharge: 2018-12-04 | Disposition: A | Discharge status: Home / Self Care | Payer: Medicaid Other | Attending: Certified Nurse Midwife | Admitting: Certified Nurse Midwife

## 2018-12-04 ENCOUNTER — Encounter: Payer: Self-pay | Admitting: *Deleted

## 2018-12-04 DIAGNOSIS — O99213 Obesity complicating pregnancy, third trimester: Secondary | ICD-10-CM | POA: Diagnosis not present

## 2018-12-04 DIAGNOSIS — M5430 Sciatica, unspecified side: Secondary | ICD-10-CM | POA: Insufficient documentation

## 2018-12-04 DIAGNOSIS — O26893 Other specified pregnancy related conditions, third trimester: Secondary | ICD-10-CM | POA: Insufficient documentation

## 2018-12-04 DIAGNOSIS — Z3A37 37 weeks gestation of pregnancy: Secondary | ICD-10-CM | POA: Diagnosis not present

## 2018-12-04 DIAGNOSIS — K439 Ventral hernia without obstruction or gangrene: Secondary | ICD-10-CM | POA: Diagnosis not present

## 2018-12-04 DIAGNOSIS — R197 Diarrhea, unspecified: Secondary | ICD-10-CM

## 2018-12-04 DIAGNOSIS — Z9889 Other specified postprocedural states: Secondary | ICD-10-CM

## 2018-12-04 DIAGNOSIS — O471 False labor at or after 37 completed weeks of gestation: Secondary | ICD-10-CM | POA: Diagnosis not present

## 2018-12-04 DIAGNOSIS — O09299 Supervision of pregnancy with other poor reproductive or obstetric history, unspecified trimester: Secondary | ICD-10-CM

## 2018-12-04 DIAGNOSIS — O3433 Maternal care for cervical incompetence, third trimester: Secondary | ICD-10-CM | POA: Insufficient documentation

## 2018-12-04 DIAGNOSIS — O0990 Supervision of high risk pregnancy, unspecified, unspecified trimester: Secondary | ICD-10-CM

## 2018-12-04 DIAGNOSIS — N883 Incompetence of cervix uteri: Secondary | ICD-10-CM

## 2018-12-04 DIAGNOSIS — Z1159 Encounter for screening for other viral diseases: Secondary | ICD-10-CM | POA: Diagnosis not present

## 2018-12-04 DIAGNOSIS — O98111 Syphilis complicating pregnancy, first trimester: Secondary | ICD-10-CM

## 2018-12-04 LAB — SARS CORONAVIRUS 2 BY RT PCR (HOSPITAL ORDER, PERFORMED IN ~~LOC~~ HOSPITAL LAB): SARS Coronavirus 2: NEGATIVE

## 2018-12-04 NOTE — OB Triage Note (Signed)
Patient came in today with complaint of cramping and diarrhea.  Diarrhea started this morning.  Cramping began last night after walking. She doesn't know of anyone who has been sick that she was exposed to.,

## 2018-12-04 NOTE — Discharge Instructions (Signed)
Food Choices to Help Relieve Diarrhea, Adult  When you have diarrhea, the foods you eat and your eating habits are very important. Choosing the right foods and drinks can help:   Relieve diarrhea.   Replace lost fluids and nutrients.   Prevent dehydration.  What general guidelines should I follow?    Relieving diarrhea   Choose foods with less than 2 g or .07 oz. of fiber per serving.   Limit fats to less than 8 tsp (38 g or 1.34 oz.) a day.   Avoid the following:  ? Foods and beverages sweetened with high-fructose corn syrup, honey, or sugar alcohols such as xylitol, sorbitol, and mannitol.  ? Foods that contain a lot of fat or sugar.  ? Fried, greasy, or spicy foods.  ? High-fiber grains, breads, and cereals.  ? Raw fruits and vegetables.   Eat foods that are rich in probiotics. These foods include dairy products such as yogurt and fermented milk products. They help increase healthy bacteria in the stomach and intestines (gastrointestinal tract, or GI tract).   If you have lactose intolerance, avoid dairy products. These may make your diarrhea worse.   Take medicine to help stop diarrhea (antidiarrheal medicine) only as told by your health care provider.  Replacing nutrients   Eat small meals or snacks every 3-4 hours.   Eat bland foods, such as white rice, toast, or baked potato, until your diarrhea starts to get better. Gradually reintroduce nutrient-rich foods as tolerated or as told by your health care provider. This includes:  ? Well-cooked protein foods.  ? Peeled, seeded, and soft-cooked fruits and vegetables.  ? Low-fat dairy products.   Take vitamin and mineral supplements as told by your health care provider.  Preventing dehydration   Start by sipping water or a special solution to prevent dehydration (oral rehydration solution, ORS). Urine that is clear or pale yellow means that you are getting enough fluid.   Try to drink at least 8-10 cups of fluid each day to help replace lost  fluids.   You may add other liquids in addition to water, such as clear juice or decaffeinated sports drinks, as tolerated or as told by your health care provider.   Avoid drinks with caffeine, such as coffee, tea, or soft drinks.   Avoid alcohol.  What foods are recommended?         The items listed may not be a complete list. Talk with your health care provider about what dietary choices are best for you.  Grains  White rice. White, French, or pita breads (fresh or toasted), including plain rolls, buns, or bagels. White pasta. Saltine, soda, or graham crackers. Pretzels. Low-fiber cereal. Cooked cereals made with water (such as cornmeal, farina, or cream cereals). Plain muffins. Matzo. Melba toast. Zwieback.  Vegetables  Potatoes (without the skin). Most well-cooked and canned vegetables without skins or seeds. Tender lettuce.  Fruits  Apple sauce. Fruits canned in juice. Cooked apricots, cherries, grapefruit, peaches, pears, or plums. Fresh bananas and cantaloupe.  Meats and other protein foods  Baked or boiled chicken. Eggs. Tofu. Fish. Seafood. Smooth nut butters. Ground or well-cooked tender beef, ham, veal, lamb, pork, or poultry.  Dairy  Plain yogurt, kefir, and unsweetened liquid yogurt. Lactose-free milk, buttermilk, skim milk, or soy milk. Low-fat or nonfat hard cheese.  Beverages  Water. Low-calorie sports drinks. Fruit juices without pulp. Strained tomato and vegetable juices. Decaffeinated teas. Sugar-free beverages not sweetened with sugar alcohols. Oral rehydration solutions, if   approved by your health care provider.  Seasoning and other foods  Bouillon, broth, or soups made from recommended foods.  What foods are not recommended?  The items listed may not be a complete list. Talk with your health care provider about what dietary choices are best for you.  Grains  Whole grain, whole wheat, bran, or rye breads, rolls, pastas, and crackers. Wild or brown rice. Whole grain or bran cereals. Barley.  Oats and oatmeal. Corn tortillas or taco shells. Granola. Popcorn.  Vegetables  Raw vegetables. Fried vegetables. Cabbage, broccoli, Brussels sprouts, artichokes, baked beans, beet greens, corn, kale, legumes, peas, sweet potatoes, and yams. Potato skins. Cooked spinach and cabbage.  Fruits  Dried fruit, including raisins and dates. Raw fruits. Stewed or dried prunes. Canned fruits with syrup.  Meat and other protein foods  Fried or fatty meats. Deli meats. Chunky nut butters. Nuts and seeds. Beans and lentils. Bacon. Hot dogs. Sausage.  Dairy  High-fat cheeses. Whole milk, chocolate milk, and beverages made with milk, such as milk shakes. Half-and-half. Cream. sour cream. Ice cream.  Beverages  Caffeinated beverages (such as coffee, tea, soda, or energy drinks). Alcoholic beverages. Fruit juices with pulp. Prune juice. Soft drinks sweetened with high-fructose corn syrup or sugar alcohols. High-calorie sports drinks.  Fats and oils  Butter. Cream sauces. Margarine. Salad oils. Plain salad dressings. Olives. Avocados. Mayonnaise.  Sweets and desserts  Sweet rolls, doughnuts, and sweet breads. Sugar-free desserts sweetened with sugar alcohols such as xylitol and sorbitol.  Seasoning and other foods  Honey. Hot sauce. Chili powder. Gravy. Cream-based or milk-based soups. Pancakes and waffles.  Summary   When you have diarrhea, the foods you eat and your eating habits are very important.   Make sure you get at least 8-10 cups of fluid each day, or enough to keep your urine clear or pale yellow.   Eat bland foods and gradually reintroduce healthy, nutrient-rich foods as tolerated, or as told by your health care provider.   Avoid high-fiber, fried, greasy, or spicy foods.  This information is not intended to replace advice given to you by your health care provider. Make sure you discuss any questions you have with your health care provider.  Document Released: 08/30/2003 Document Revised: 06/06/2016 Document Reviewed:  06/06/2016  Elsevier Interactive Patient Education  2019 [employer]vier Inc[employer]

## 2018-12-04 NOTE — Final Progress Note (Signed)
Physician Final Progress Note  Patient ID: Donna Navarro MRN: [id number] DOB/AGE: [birth year] 33 y.o.  Admit date: 12/04/2018 Admitting provider: Malachy Mood, MD/ Jesus Genera. Danise Mina, CNM Discharge date: 12/04/2018   Admission Diagnoses: IUP at 37wk3d with diarrhea  Discharge Diagnoses:  Same as above Braxton Hicks contractions  Consults: None  Significant Findings/ Diagnostic Studies: 33 year old G6 P2122 at 37.[redacted] weeks gestation by 12/22/2018 [facility] presented with complaints of watery stools since after wakening this AM. Has had more than 10 stools. Started off brown in color and now the stools are more green in color. Can hear gurgling in her abdomen prior to feeling the need to have a BM. Has also been feeling the baby "ball up" in her left fundal area intermittently. Denies fever, nausea and vomiting. Had Asian wings last night before going to bed. Has been able to tolerate cheese crackers, a slushy, water this AM.  No vaginal bleeding, leakage of fluid. Has not felt the baby move as much today since the diarrhea started. Seen in L&D yesterday for lower abdominal cramping and was not in labor. Cervix 1/50%/-2 to -3 Her prenatal care has also been complicated by a hx of an incompetent cervix (had her cerclage removed 11/29/2018) , a large ventral hernia, morbid obesity with current BMI of 50 kg/m2, sciatica, and syphilis in the first trimester (treated at [facility] with PCN x 3 doses).    Prenatal care was also remarkable for   [facility] Prenatal Labs  Dating  T1 [place] Blood type: --/--/A POS (12/23 1305)   Genetic Screen NIPS:   Normal XX Antibody:NEG (12/23 1305)  Anatomic [place] complete Rubella: 2.87 (12/20 1636)  Varicella: Immune  GTT Early: hgba1c 5.4       28 wk:  158, 3hr 1 elevated value    RPR: Reactive (12/20 1636) Titer 1:4 with +TP Treatment at [facility] RPR titer at 28 weeks was 1:8 RPR titer at  32 weeks was 1:8  Rhogam  not needed HBsAg: Negative (12/20 1636)   TDaP vaccine        5/18                HIV: Non Reactive (12/20 1636)   Flu Shot    Declines                            GBS: positive  Contraception  BTL- [x]  consents Okay with IUD or Nexplanon after birth Pap: NIL 2019  CBB  no   CS/VBAC  not needed   Baby Food  bottle   Support Person   significant other- [name]     Exam: Vital signs: BP 122/71   Pulse (!) 115   Temp 98.3 F (36.8 C) (Oral)   Resp 16   LMP 03/16/2018 (Approximate)  General: gravid BF in NAD. Had one episode of diarrhea since arrival 1.5 hours ago. Respiratory: normal respiratory effort Abdomen: large, soft, non tender ventral hernia to right of midline, bowel sounds heard without stethoscope, unable to feel uterine contractions with palpation when patient feeling tightening. Did pick some contractions up with toco  FHR: 135 baseline with accelerations to 160s to 170, moderate variability Toco: contractions every 4-10 min apart, mild  Cervix: 1/60%/-2 to -3 Results for orders placed or performed during the hospital encounter of 12/04/18 (from the past 24 hour(s))  SARS Coronavirus 2 (CEPHEID - Performed in [place] lab), [facility]  Status: None   Collection Time: 12/04/18  4:50 PM   Specimen: Nasopharyngeal Swab  Result Value Ref Range   SARS Coronavirus 2 NEGATIVE NEGATIVE   A: IUP at 37wk3d. Diarrhea-seems to be abating. Possible acute gastroenteritis Uterine irritability/contractions due to diarrhea. No cervical change  P; Discharge home with labor precautions BRAT diet today Hydrate Avoid greasy, spicy foods for a couple days.  Procedures: none  Discharge Condition: stable  Disposition: Discharge disposition: 01-Home or Self Care       Diet: Regular diet  Discharge Activity: Activity as tolerated  Discharge Instructions    Discharge patient   Complete by: As directed    Discharge disposition: 01-Home or Self Care   Discharge patient date: 12/04/2018     Allergies as of 12/04/2018    No Known Allergies     Medication List    STOP taking these medications   cyclobenzaprine 10 MG tablet Commonly known as: FLEXERIL     TAKE these medications   acetaminophen 325 MG tablet Commonly known as: TYLENOL Take 650 mg by mouth every 6 (six) hours as needed.   calcium carbonate 500 MG chewable tablet Commonly known as: TUMS - dosed in mg elemental calcium Chew 1 tablet by mouth daily.   prenatal vitamin w/FE, FA 29-1 MG Chew chewable tablet Chew 1 tablet by mouth daily.        Total time spent taking care of this patient: 15 minutes  Signed: Farrel Connersolleen Lilah Mijangos 12/04/2018, 6:00 PM

## 2018-12-06 ENCOUNTER — Telehealth: Payer: Self-pay

## 2018-12-06 ENCOUNTER — Other Ambulatory Visit: Payer: Self-pay | Admitting: Certified Nurse Midwife

## 2018-12-06 ENCOUNTER — Inpatient Hospital Stay
Admission: EM | Admit: 2018-12-06 | Discharge: 2018-12-06 | Disposition: A | Discharge status: Home / Self Care | Payer: Medicaid Other | Attending: Obstetrics and Gynecology | Admitting: Obstetrics and Gynecology

## 2018-12-06 ENCOUNTER — Other Ambulatory Visit: Payer: Self-pay

## 2018-12-06 ENCOUNTER — Ambulatory Visit: Payer: Medicaid Other | Admitting: Surgery

## 2018-12-06 DIAGNOSIS — O0990 Supervision of high risk pregnancy, unspecified, unspecified trimester: Secondary | ICD-10-CM

## 2018-12-06 DIAGNOSIS — N898 Other specified noninflammatory disorders of vagina: Secondary | ICD-10-CM

## 2018-12-06 DIAGNOSIS — Z3A37 37 weeks gestation of pregnancy: Secondary | ICD-10-CM | POA: Diagnosis not present

## 2018-12-06 DIAGNOSIS — Z9889 Other specified postprocedural states: Secondary | ICD-10-CM

## 2018-12-06 DIAGNOSIS — K439 Ventral hernia without obstruction or gangrene: Secondary | ICD-10-CM | POA: Insufficient documentation

## 2018-12-06 DIAGNOSIS — O99213 Obesity complicating pregnancy, third trimester: Secondary | ICD-10-CM | POA: Diagnosis not present

## 2018-12-06 DIAGNOSIS — O3433 Maternal care for cervical incompetence, third trimester: Secondary | ICD-10-CM | POA: Diagnosis not present

## 2018-12-06 DIAGNOSIS — O26893 Other specified pregnancy related conditions, third trimester: Secondary | ICD-10-CM | POA: Diagnosis present

## 2018-12-06 DIAGNOSIS — O98111 Syphilis complicating pregnancy, first trimester: Secondary | ICD-10-CM

## 2018-12-06 DIAGNOSIS — O09299 Supervision of pregnancy with other poor reproductive or obstetric history, unspecified trimester: Secondary | ICD-10-CM

## 2018-12-06 DIAGNOSIS — N883 Incompetence of cervix uteri: Secondary | ICD-10-CM

## 2018-12-06 NOTE — Telephone Encounter (Signed)
Pt calling c questions; she has appt for u/s tomorrow and comes back Friday for u/s (?) and strip membranes.  She is tired of going back and forth; has been to hosp 3x in 3d.  Can she do everything at appt tomorrow instead of having to come tomorrow and Fri.?  [phone]

## 2018-12-06 NOTE — Telephone Encounter (Signed)
Please advise 

## 2018-12-06 NOTE — OB Triage Note (Signed)
Pt reports to unit c/o of a large gush of clear fluid that occurred around 0100 this morning. Pt reports feeling "some cramping" that comes and goes. Pt reports positive fetal movement and denies vaginal bleeding. Vital signs WDL. Initial FHT 132. Will continue to monitor.

## 2018-12-06 NOTE — Discharge Instructions (Signed)
Please return to L&D for persistent leakage of fluid, regular/strong contractions, decreased fetal movement or bleeding like a period.

## 2018-12-06 NOTE — Final Progress Note (Signed)
Physician Final Progress Note  Patient ID: Donna Navarro MRN: [id number] DOB/AGE: February 22, 1986 33 y.o.  Admit date: 12/06/2018 Admitting provider: Malachy Mood, MD/ Donna Navarro. Donna Navarro, CNM Discharge date: 12/06/2018   Admission Diagnoses: leaking fluid at 37wk5d  Discharge Diagnoses:  IUP at 37wk5d with no evidence of PROM.   Consults: None  Significant Findings/ Diagnostic Studies:  32 year old G6 P2122 at 37.[redacted] weeks gestation by 12/22/2018 [name] with complaints of a large gush of clear water accompanied by some blood tinged mucous around 0100 this AM. She also had intercourse this evening. Having some cramping in her lower abdomen.  THis is her third presentation to L&D in the past 4 days. Cervix 1/60%/-2 to -3 on her last visit 6/13. Baby active. Her prenatal care has also been complicated by a hx of an incompetent cervix (had her cerclage removed 11/29/2018) , a large ventral hernia, morbid obesity with current BMI of 50 kg/m2, sciatica, and syphilis in the first trimester (treated at [facility] with PCN x 3 doses).    Prenatal care was also remarkable for   [facility] Prenatal Labs  Dating  T1 [place] Blood type: --/--/A POS (12/23 1305)   Genetic Screen NIPS:   Normal XX Antibody:NEG (12/23 1305)  Anatomic [place] complete Rubella: 2.87 (12/20 1636)  Varicella: Immune  GTT Early: hgba1c 5.4       28 wk:  158, 3hr 1 elevated value    RPR: Reactive (12/20 1636) Titer 1:4 with +TP Treatment at [facility] RPR titer at 28 weeks was 1:8 RPR titer at  32 weeks was 1:8  Rhogam  not needed HBsAg: Negative (12/20 1636)   TDaP vaccine       5/18                HIV: Non Reactive (12/20 1636)   Flu Shot    Declines                            GBS: positive  Contraception  BTL- [x]  consents Okay with IUD or Nexplanon after birth Pap: NIL 2019  CBB  no   CS/VBAC  not needed   Baby Food  bottle   Support Person   significant other- Donna Navarro     Exam: Vital signs: BP 135/88   Pulse  85   Temp 98.8 F (37.1 C) (Oral)   Resp 18   LMP 03/16/2018 (Approximate)    General: gravid BF in NAD. Respiratory: normal respiratory effort Abdomen: large, soft, non tender ventral hernia to right of midline,   FHR: 125 baseline with accelerations to 150s, moderate variability Toco: contractions every 2-9 min apart, mild  Sterile speculum exam: External/ BUS: introitus appears inflamed Vagina: scant mucoid discharge Wet prep: negative for hyphae, Trich, clue cells. +sperm seen No pooling or ferning Cervix: 1/75%/-1 to -2  Ultrasound: pockets of 2.5cm + 2.2 cm + 2.9BM= 8.4XL Cephalic (OP): difficulty performing AFI due to body habitus and large ventral hernia Lab Results Last 24 Hours       Results for orders placed or performed during the hospital encounter of 12/04/18 (from the past 24 hour(s))  SARS Coronavirus 2 (CEPHEID - Performed in [place] hospital lab), Hosp Order     Status: None   Collection Time: 12/04/18  4:50 PM   Specimen: Nasopharyngeal Swab  Result Value Ref Range   SARS Coronavirus 2 NEGATIVE NEGATIVE     A: IUP at  37wk5d. No evidence of PROM, but decreased AFI c/w AFI on 11/29/2018 (11cm), but difficulty doing AFI   P; Discharge home with labor precautions Will have AFI repeated in office today or tomorrow  Procedures: none  Discharge Condition: stable  Disposition: Discharge disposition: 01-Home or Self Care       Diet: Regular diet  Discharge Activity: Activity as tolerated   Allergies as of 12/06/2018   No Known Allergies     Medication List    TAKE these medications   acetaminophen 325 MG tablet Commonly known as: TYLENOL Take 650 mg by mouth every 6 (six) hours as needed.   calcium carbonate 500 MG chewable tablet Commonly known as: TUMS - dosed in mg elemental calcium Chew 1 tablet by mouth daily.   prenatal vitamin w/FE, FA 29-1 MG Chew chewable tablet Chew 1 tablet by mouth daily.        Total time spent  taking care of this patient: 20 minutes  Signed: Farrel Connersolleen Emilio Navarro 12/06/2018, 2:42 AM

## 2018-12-07 ENCOUNTER — Ambulatory Visit (INDEPENDENT_AMBULATORY_CARE_PROVIDER_SITE_OTHER): Payer: Medicaid Other

## 2018-12-07 DIAGNOSIS — Z3689 Encounter for other specified antenatal screening: Secondary | ICD-10-CM | POA: Diagnosis not present

## 2018-12-07 DIAGNOSIS — O99213 Obesity complicating pregnancy, third trimester: Secondary | ICD-10-CM

## 2018-12-07 NOTE — Telephone Encounter (Signed)
May need both appointments, can be decided by [facility] tomorrow

## 2018-12-10 ENCOUNTER — Ambulatory Visit (INDEPENDENT_AMBULATORY_CARE_PROVIDER_SITE_OTHER): Payer: Medicaid Other

## 2018-12-10 ENCOUNTER — Ambulatory Visit (INDEPENDENT_AMBULATORY_CARE_PROVIDER_SITE_OTHER): Payer: Medicaid Other | Admitting: Advanced Practice Midwife

## 2018-12-10 ENCOUNTER — Other Ambulatory Visit: Payer: Self-pay

## 2018-12-10 ENCOUNTER — Encounter: Payer: Self-pay | Admitting: Advanced Practice Midwife

## 2018-12-10 VITALS — BP 120/80 | Wt 325.0 lb

## 2018-12-10 DIAGNOSIS — O09293 Supervision of pregnancy with other poor reproductive or obstetric history, third trimester: Secondary | ICD-10-CM | POA: Diagnosis not present

## 2018-12-10 DIAGNOSIS — Z3A38 38 weeks gestation of pregnancy: Secondary | ICD-10-CM

## 2018-12-10 DIAGNOSIS — Z3689 Encounter for other specified antenatal screening: Secondary | ICD-10-CM

## 2018-12-10 DIAGNOSIS — O0990 Supervision of high risk pregnancy, unspecified, unspecified trimester: Secondary | ICD-10-CM

## 2018-12-10 DIAGNOSIS — O99213 Obesity complicating pregnancy, third trimester: Secondary | ICD-10-CM

## 2018-12-10 DIAGNOSIS — O0993 Supervision of high risk pregnancy, unspecified, third trimester: Secondary | ICD-10-CM

## 2018-12-10 LAB — POCT URINALYSIS DIPSTICK OB: Glucose, UA: NEGATIVE

## 2018-12-10 NOTE — Progress Notes (Signed)
Routine Prenatal Care Visit  Subjective  Donna Navarro is a 33 y.o. [id number] at 583w2d being seen today for ongoing prenatal care.  She is currently monitored for the following issues for this high-risk pregnancy and has Ventral hernia without obstruction or gangrene; Morbid obesity (HCC); Oligomenorrhea; High-risk pregnancy supervision, unspecified trimester; Cervical incompetence; Hx of cerclage, currently pregnant; Cervical cerclage suture present, antepartum; Obesity affecting pregnancy, antepartum, third trimester; Morbid obesity with BMI of 40.0-44.9, adult (HCC); Syphilis affecting pregnancy in first trimester; Sciatica; Pregnancy with abdominal cramping of lower quadrant, antepartum; and Diarrhea on their problem list.  ----------------------------------------------------------------------------------- Patient reports doing well and is requesting cervical sweep today and 39 week elective induction.   Contractions: Irregular. Vag. Bleeding: None.  Movement: Present. Denies leaking of fluid.  ----------------------------------------------------------------------------------- The following portions of the patient's history were reviewed and updated as appropriate: allergies, current medications, past family history, past medical history, past social history, past surgical history and problem list. Problem list updated.   Objective  Blood pressure 120/80, weight (!) 325 lb (147.4 kg), last menstrual period 03/16/2018. Pregravid weight 289 lb (131.1 kg) Total Weight Gain 36 lb (16.3 kg) Urinalysis: Urine Protein Small (1+)  Urine Glucose Negative  Fetal Status: Fetal Heart Rate (bpm): 130   Movement: Present  Presentation: Vertex  AFI today 14.5 NST reactive 20 minute tracing, 130 bpm baseline, moderate variability, + 15x15 accelerations, -decelerations  General:  Alert, oriented and cooperative. Patient is in no acute distress.  Skin: Skin is warm and dry. No rash noted.    Cardiovascular: Normal heart rate noted  Respiratory: Normal respiratory effort, no problems with respiration noted  Abdomen: Soft, gravid, appropriate for gestational age. Pain/Pressure: Present     Pelvic:  Cervical exam performed Dilation: 2.5 Effacement (%): 70 Station: -2, cervical sweep  Extremities: Normal range of motion.  Edema: Trace  Mental Status: Normal mood and affect. Normal behavior. Normal judgment and thought content.   Assessment   33 y.o. [id number] at 543w2d by  12/22/2018, [facility] presenting for routine prenatal visit  Plan   Pregnancy #6 Problems (from 05/17/18 to present)    Problem Noted Resolved   Syphilis affecting pregnancy in first trimester 08/23/2018 by Conard NovakJackson, Stephen D, MD No   Overview Signed 08/23/2018 12:07 PM by Conard NovakJackson, Stephen D, MD    Treated at [facility] - retest at 28 weeks (RPR)      Obesity affecting pregnancy, antepartum, third trimester 08/09/2018 by Nadara MustardHarris, Robert P, MD No   High-risk pregnancy supervision, unspecified trimester 05/17/2018 by Natale MilchSchuman, Christanna R, MD No   Overview Addendum 11/29/2018  9:46 AM by Natale MilchSchuman, Christanna R, MD      [facility] Prenatal Labs  Dating  T1 US Blood type: --/--/A POS (12/23 1305)   Genetic Screen NIPS:   Normal XX Antibody:NEG (12/23 1305)  Anatomic US complete Rubella: 2.87 (12/20 1636)  Varicella: Immune  GTT Early: hgba1c 5.4       28 wk:  158, 3hr 1 elevated value    RPR: Reactive (12/20 1636)  Treatment at [facility]  Rhogam  not needed HBsAg: Negative (12/20 1636)   TDaP vaccine       5/18                HIV: Non Reactive (12/20 1636)   Flu Shot    Declines  GBS:   Contraception  BTL- [x]  consents Okay with IUD or Nexplanon after birth Pap: NIL 2019  CBB  no   CS/VBAC  not needed   Baby Food  bottle   Support Person   significant other- [name]           Cervical incompetence 05/17/2018 by Homero Fellers, MD No   Hx of cerclage, currently pregnant  05/17/2018 by Homero Fellers, MD No   Overview Addendum 06/28/2018  4:16 PM by Rexene Agent, CNM    - cerclage after [redacted] weeks gestation [x]            Term labor symptoms and general obstetric precautions including but not limited to vaginal bleeding, contractions, leaking of fluid and fetal movement were reviewed in detail with the patient. Please refer to After Visit Summary for other counseling recommendations.   Return for next week Tuesday NST/ROB and Friday AFI/NST/ROB.   IOL scheduled 12/15/18 at 5 am Pre-iol covid [facility]  Rod Can, [place] 12/10/2018 11:35 AM

## 2018-12-10 NOTE — Progress Notes (Signed)
oc

## 2018-12-12 ENCOUNTER — Other Ambulatory Visit: Payer: Self-pay

## 2018-12-12 ENCOUNTER — Observation Stay
Admission: EM | Admit: 2018-12-12 | Discharge: 2018-12-12 | Disposition: A | Discharge status: Home / Self Care | Payer: Medicaid Other | Attending: Obstetrics and Gynecology | Admitting: Obstetrics and Gynecology

## 2018-12-12 DIAGNOSIS — O368130 Decreased fetal movements, third trimester, not applicable or unspecified: Principal | ICD-10-CM | POA: Insufficient documentation

## 2018-12-12 DIAGNOSIS — Z3A38 38 weeks gestation of pregnancy: Secondary | ICD-10-CM | POA: Insufficient documentation

## 2018-12-12 DIAGNOSIS — Z3490 Encounter for supervision of normal pregnancy, unspecified, unspecified trimester: Secondary | ICD-10-CM

## 2018-12-12 DIAGNOSIS — O99613 Diseases of the digestive system complicating pregnancy, third trimester: Secondary | ICD-10-CM | POA: Diagnosis not present

## 2018-12-12 DIAGNOSIS — K219 Gastro-esophageal reflux disease without esophagitis: Secondary | ICD-10-CM | POA: Diagnosis not present

## 2018-12-12 DIAGNOSIS — O99213 Obesity complicating pregnancy, third trimester: Secondary | ICD-10-CM | POA: Diagnosis not present

## 2018-12-12 DIAGNOSIS — O368190 Decreased fetal movements, unspecified trimester, not applicable or unspecified: Secondary | ICD-10-CM

## 2018-12-12 MED ORDER — ACETAMINOPHEN 325 MG PO TABS: 650.0000 mg | ORAL_TABLET | ORAL | Status: DC | PRN

## 2018-12-12 NOTE — Discharge Summary (Signed)
Physician Final Progress Note  Patient ID: Donna Navarro MRN: [id number] DOB/AGE: June 03, 1986 33 y.o.  Admit date: 12/12/2018 Admitting provider: Natale Milchhristanna R Schuman, MD Discharge date: 12/12/2018   Admission Diagnoses: Decreased fetal movement  Discharge Diagnoses:  Active Problems:   Pregnancy   Indication for care in labor and delivery, antepartum   Decreased fetal movement IUP at 38 weeks Reactive NST  History of Present Illness: The patient is a 33 y.o. female [id number] at 358w4d who presents for decreased fetal movement today. She denies regular cramping contractions but has had some braxton hicks. She denies leakage of fluid or vaginal bleeding.   By the time of discharge she admits feeling the baby move. She has an induction of labor scheduled for 12/15/2018.   The patient was seen by RN while in the hospital. She was comfortable being discharged and verbal orders were given to RN to discharge patient to home. Patient was seen by me in the office 2 days ago and had a reactive NST at that time.   Past Medical History:  Diagnosis Date  . GERD (gastroesophageal reflux disease)    with pregnancy-no meds  . Hypertension    lost weight and pcp took pt off bp meds 7 months  . Irregular periods   . Morbid obesity (HCC) 04/30/2017  . Obesity affecting pregnancy in second trimester 08/09/2018  . Ventral hernia without obstruction or gangrene 04/30/2017    Past Surgical History:  Procedure Laterality Date  . CERVICAL CERCLAGE N/A 06/17/2018   Procedure: CERCLAGE CERVICAL;  Surgeon: Donna MilchSchuman, Christanna R, MD;  Location: [facility] ORS;  Service: Gynecology;  Laterality: N/A;  . CHOLECYSTECTOMY      No current facility-administered medications on file prior to encounter.    Current Outpatient Medications on File Prior to Encounter  Medication Sig Dispense Refill  . acetaminophen (TYLENOL) 325 MG tablet Take 650 mg by mouth every 6 (six) hours as needed.    . calcium carbonate (TUMS  - DOSED IN MG ELEMENTAL CALCIUM) 500 MG chewable tablet Chew 1 tablet by mouth daily.    . prenatal vitamin w/FE, FA (NATACHEW) 29-1 MG CHEW chewable tablet Chew 1 tablet by mouth daily.       No Known Allergies  Social History   Socioeconomic History  . Marital status: Married    Spouse name: Not on file  . Number of children: 2  . Years of education: Not on file  . Highest education level: Not on file  Occupational History  . Not on file  Social Needs  . Financial resource strain: Not hard at all  . Food insecurity    Worry: Never true    Inability: Never true  . Transportation needs    Medical: No    Non-medical: No  Tobacco Use  . Smoking status: Never Smoker  . Smokeless tobacco: Never Used  Substance and Sexual Activity  . Alcohol use: Never    Frequency: Never  . Drug use: Never  . Sexual activity: Yes    Birth control/protection: Surgical, Injection    Comment: BTL  Lifestyle  . Physical activity    Days per week: 4 days    Minutes per session: 30 min  . Stress: Only a little  Relationships  . Social Musicianconnections    Talks on phone: Patient refused    Gets together: Patient refused    Attends religious service: Patient refused    Active member of club or organization: Patient refused    Attends  meetings of clubs or organizations: Patient refused    Relationship status: Patient refused  . Intimate partner violence    Fear of current or ex partner: No    Emotionally abused: No    Physically abused: No    Forced sexual activity: No  Other Topics Concern  . Not on file  Social History Narrative  . Not on file    Family History  Problem Relation Age of Onset  . Heart disease Mother        Massive Heart Attack     Review of Systems  Constitutional: Negative.   HENT: Negative.   Eyes: Negative.   Respiratory: Negative.   Cardiovascular: Negative.   Gastrointestinal: Negative.   Genitourinary: Negative.   Musculoskeletal: Negative.   Skin:  Negative.   Neurological: Negative.   Endo/Heme/Allergies: Negative.   Psychiatric/Behavioral: Negative.      Physical Exam: BP 129/79 (BP Location: Right Arm)   Pulse 87   Temp 98.6 F (37 C) (Oral)   Resp 18   Ht 5\' 7"  (1.702 m)   Wt (!) 147.4 kg   LMP 03/16/2018 (Approximate)   BMI 50.90 kg/m   Constitutional: Well nourished, well developed female in no acute distress.  HEENT: normal Skin: Warm and dry.  Cardiovascular: Regular rate and rhythm.   Respiratory:  Normal respiratory effort Abdomen: FHT present Psych: Alert and Oriented x3. No memory deficits. Normal mood and affect.  MS: normal gait, normal bilateral lower extremity ROM/strength/stability.  Pelvic exam: deferred Toco: rare contraction Fetal well being: 155 bpm, moderate variability, +accelerations, -decelerations   Consults: None  Significant Findings/ Diagnostic Studies: none  Procedures: NST  Hospital Course: The patient was admitted to Labor and Delivery Triage for observation.   Discharge Condition: good  Disposition: Discharge disposition: 01-Home or Self Care      Diet: Regular diet  Discharge Activity: Activity as tolerated  Discharge Instructions    Discharge activity:  No Restrictions   Complete by: As directed    Discharge diet:  No restrictions   Complete by: As directed    Fetal Kick Count:  Lie on our left side for one hour after a meal, and count the number of times your baby kicks.  If it is less than 5 times, get up, move around and drink some juice.  Repeat the test 30 minutes later.  If it is still less than 5 kicks in an hour, notify your doctor.   Complete by: As directed    LABOR:  When conractions begin, you should start to time them from the beginning of one contraction to the beginning  of the next.  When contractions are 5 - 10 minutes apart or less and have been regular for at least an hour, you should call your health care provider.   Complete by: As directed    No  sexual activity restrictions   Complete by: As directed    Notify physician for bleeding from the vagina   Complete by: As directed    Notify physician for blurring of vision or spots before the eyes   Complete by: As directed    Notify physician for chills or fever   Complete by: As directed    Notify physician for fainting spells, "black outs" or loss of consciousness   Complete by: As directed    Notify physician for increase in vaginal discharge   Complete by: As directed    Notify physician for leaking of fluid   Complete  by: As directed    Notify physician for pain or burning when urinating   Complete by: As directed    Notify physician for pelvic pressure (sudden increase)   Complete by: As directed    Notify physician for severe or continued nausea or vomiting   Complete by: As directed    Notify physician for sudden gushing of fluid from the vagina (with or without continued leaking)   Complete by: As directed    Notify physician for sudden, constant, or occasional abdominal pain   Complete by: As directed    Notify physician if baby moving less than usual   Complete by: As directed      Allergies as of 12/12/2018   No Known Allergies     Medication List    TAKE these medications   acetaminophen 325 MG tablet Commonly known as: TYLENOL Take 650 mg by mouth every 6 (six) hours as needed.   calcium carbonate 500 MG chewable tablet Commonly known as: TUMS - dosed in mg elemental calcium Chew 1 tablet by mouth daily.   prenatal vitamin w/FE, FA 29-1 MG Chew chewable tablet Chew 1 tablet by mouth daily.       Total time spent taking care of this patient: 10 minutes  Signed: Rod Can, CNM  12/12/2018, 8:36 PM

## 2018-12-12 NOTE — OB Triage Note (Signed)
Patient came in for observation for decreased fetal movement all day. Patient braxton hicks contractions that are not painful. Patient denies leaking of fluid and denies vaginal bleeding and spotting. Vital signs stable and patient afebrile. FHR baseline 145 with moderate variability  15 x 15 accelerations and no decelerations. Will continue to monitor.

## 2018-12-13 ENCOUNTER — Ambulatory Visit
Admission: RE | Admit: 2018-12-13 | Discharge: 2018-12-13 | Disposition: A | Discharge status: Home / Self Care | Payer: Medicaid Other | Source: Ambulatory Visit | Attending: Obstetrics and Gynecology | Admitting: Obstetrics and Gynecology

## 2018-12-13 DIAGNOSIS — Z1159 Encounter for screening for other viral diseases: Secondary | ICD-10-CM | POA: Insufficient documentation

## 2018-12-14 ENCOUNTER — Encounter: Payer: Medicaid Other | Admitting: Obstetrics and Gynecology

## 2018-12-14 LAB — NOVEL CORONAVIRUS, NAA (HOSP ORDER, SEND-OUT TO REF LAB; TAT 18-24 HRS): SARS-CoV-2, NAA: NOT DETECTED

## 2018-12-15 ENCOUNTER — Inpatient Hospital Stay: Payer: Medicaid Other | Admitting: Certified Registered Nurse Anesthetist

## 2018-12-15 ENCOUNTER — Inpatient Hospital Stay
Admission: EM | Admit: 2018-12-15 | Discharge: 2018-12-17 | DRG: 806 | Disposition: A | Discharge status: Home / Self Care | Payer: Medicaid Other | Attending: Certified Nurse Midwife | Admitting: Certified Nurse Midwife

## 2018-12-15 ENCOUNTER — Encounter: Payer: Self-pay | Admitting: *Deleted

## 2018-12-15 ENCOUNTER — Other Ambulatory Visit: Payer: Self-pay

## 2018-12-15 DIAGNOSIS — O99214 Obesity complicating childbirth: Secondary | ICD-10-CM | POA: Diagnosis present

## 2018-12-15 DIAGNOSIS — O99213 Obesity complicating pregnancy, third trimester: Secondary | ICD-10-CM

## 2018-12-15 DIAGNOSIS — O26893 Other specified pregnancy related conditions, third trimester: Secondary | ICD-10-CM | POA: Diagnosis present

## 2018-12-15 DIAGNOSIS — D62 Acute posthemorrhagic anemia: Secondary | ICD-10-CM | POA: Diagnosis present

## 2018-12-15 DIAGNOSIS — Z9889 Other specified postprocedural states: Secondary | ICD-10-CM

## 2018-12-15 DIAGNOSIS — O09299 Supervision of pregnancy with other poor reproductive or obstetric history, unspecified trimester: Secondary | ICD-10-CM

## 2018-12-15 DIAGNOSIS — O98111 Syphilis complicating pregnancy, first trimester: Secondary | ICD-10-CM

## 2018-12-15 DIAGNOSIS — O9962 Diseases of the digestive system complicating childbirth: Secondary | ICD-10-CM | POA: Diagnosis present

## 2018-12-15 DIAGNOSIS — Z3490 Encounter for supervision of normal pregnancy, unspecified, unspecified trimester: Secondary | ICD-10-CM | POA: Diagnosis present

## 2018-12-15 DIAGNOSIS — K439 Ventral hernia without obstruction or gangrene: Secondary | ICD-10-CM | POA: Diagnosis present

## 2018-12-15 DIAGNOSIS — O9081 Anemia of the puerperium: Secondary | ICD-10-CM | POA: Diagnosis present

## 2018-12-15 DIAGNOSIS — O0990 Supervision of high risk pregnancy, unspecified, unspecified trimester: Secondary | ICD-10-CM

## 2018-12-15 DIAGNOSIS — O368130 Decreased fetal movements, third trimester, not applicable or unspecified: Secondary | ICD-10-CM

## 2018-12-15 DIAGNOSIS — O99824 Streptococcus B carrier state complicating childbirth: Secondary | ICD-10-CM

## 2018-12-15 DIAGNOSIS — Z3A39 39 weeks gestation of pregnancy: Secondary | ICD-10-CM

## 2018-12-15 DIAGNOSIS — N883 Incompetence of cervix uteri: Secondary | ICD-10-CM

## 2018-12-15 HISTORY — DX: Syphilis complicating pregnancy, unspecified trimester: O98.119

## 2018-12-15 HISTORY — DX: Incompetence of cervix uteri: N88.3

## 2018-12-15 LAB — CBC
HCT: 35.5 % — ABNORMAL LOW (ref 36.0–46.0)
Hemoglobin: 11.3 g/dL — ABNORMAL LOW (ref 12.0–15.0)
MCH: 26.7 pg (ref 26.0–34.0)
MCHC: 31.8 g/dL (ref 30.0–36.0)
MCV: 83.7 fL (ref 80.0–100.0)
Platelets: 345 10*3/uL (ref 150–400)
RBC: 4.24 MIL/uL (ref 3.87–5.11)
RDW: 14.4 % (ref 11.5–15.5)
WBC: 18.4 10*3/uL — ABNORMAL HIGH (ref 4.0–10.5)
nRBC: 0 % (ref 0.0–0.2)

## 2018-12-15 LAB — TYPE AND SCREEN
ABO/RH(D): A POS
Antibody Screen: NEGATIVE

## 2018-12-15 MED ORDER — OXYTOCIN 10 UNIT/ML IJ SOLN: 10.0000 [IU] | Freq: Once | INTRAMUSCULAR | Status: DC

## 2018-12-15 MED ORDER — LIDOCAINE-EPINEPHRINE (PF) 1.5 %-1:200000 IJ SOLN
INTRAMUSCULAR | Status: DC | PRN
  Administered 2018-12-15: 3 mL via PERINEURAL

## 2018-12-15 MED ORDER — BUPIVACAINE HCL (PF) 0.25 % IJ SOLN
INTRAMUSCULAR | Status: DC | PRN
  Administered 2018-12-15: 4 mL via EPIDURAL
  Administered 2018-12-15: 2 mL via EPIDURAL

## 2018-12-15 MED ORDER — ENOXAPARIN SODIUM 40 MG/0.4ML ~~LOC~~ SOLN: 40.0000 mg | SUBCUTANEOUS | Status: DC

## 2018-12-15 MED ORDER — OXYTOCIN BOLUS FROM INFUSION
500.0000 mL | Freq: Once | INTRAVENOUS | Status: AC
  Administered 2018-12-15: 500 mL via INTRAVENOUS

## 2018-12-15 MED ORDER — FENTANYL 2.5 MCG/ML W/ROPIVACAINE 0.15% IN NS 100 ML EPIDURAL (ARMC)
EPIDURAL | Status: AC
  Filled 2018-12-15: qty 100

## 2018-12-15 MED ORDER — SOD CITRATE-CITRIC ACID 500-334 MG/5ML PO SOLN: 30.0000 mL | ORAL | Status: DC | PRN

## 2018-12-15 MED ORDER — SODIUM CHLORIDE 0.9 % IV SOLN
5.0000 10*6.[IU] | Freq: Once | INTRAVENOUS | Status: AC
  Administered 2018-12-15: 5 10*6.[IU] via INTRAVENOUS
  Filled 2018-12-15: qty 5

## 2018-12-15 MED ORDER — LIDOCAINE HCL (PF) 1 % IJ SOLN
30.0000 mL | INTRAMUSCULAR | Status: AC | PRN
  Administered 2018-12-15: 30 mL via SUBCUTANEOUS

## 2018-12-15 MED ORDER — LACTATED RINGERS IV SOLN: 500.0000 mL | INTRAVENOUS | Status: DC | PRN

## 2018-12-15 MED ORDER — MISOPROSTOL 200 MCG PO TABS
ORAL_TABLET | ORAL | Status: AC
  Filled 2018-12-15: qty 4

## 2018-12-15 MED ORDER — ONDANSETRON HCL 4 MG PO TABS: 4.0000 mg | ORAL_TABLET | ORAL | Status: DC | PRN

## 2018-12-15 MED ORDER — OXYTOCIN 40 UNITS IN NORMAL SALINE INFUSION - SIMPLE MED: 1.0000 m[IU]/min | INTRAVENOUS | Status: DC

## 2018-12-15 MED ORDER — OXYTOCIN 40 UNITS IN NORMAL SALINE INFUSION - SIMPLE MED
1.0000 m[IU]/min | INTRAVENOUS | Status: DC
  Administered 2018-12-15: 1 m[IU]/min via INTRAVENOUS
  Filled 2018-12-15: qty 1000

## 2018-12-15 MED ORDER — OXYCODONE HCL 5 MG PO TABS: 5.0000 mg | ORAL_TABLET | ORAL | Status: DC | PRN

## 2018-12-15 MED ORDER — SENNOSIDES-DOCUSATE SODIUM 8.6-50 MG PO TABS
2.0000 | ORAL_TABLET | ORAL | Status: DC
  Filled 2018-12-15: qty 2

## 2018-12-15 MED ORDER — DIBUCAINE (PERIANAL) 1 % EX OINT: 1.0000 "application " | TOPICAL_OINTMENT | CUTANEOUS | Status: DC | PRN

## 2018-12-15 MED ORDER — AMMONIA AROMATIC IN INHA
RESPIRATORY_TRACT | Status: AC
  Filled 2018-12-15: qty 10

## 2018-12-15 MED ORDER — TERBUTALINE SULFATE 1 MG/ML IJ SOLN: 0.2500 mg | Freq: Once | INTRAMUSCULAR | Status: DC | PRN

## 2018-12-15 MED ORDER — OXYTOCIN 10 UNIT/ML IJ SOLN
INTRAMUSCULAR | Status: AC
  Filled 2018-12-15: qty 2

## 2018-12-15 MED ORDER — LACTATED RINGERS IV SOLN
INTRAVENOUS | Status: DC
  Administered 2018-12-15 (×2): via INTRAVENOUS

## 2018-12-15 MED ORDER — SIMETHICONE 80 MG PO CHEW: 80.0000 mg | CHEWABLE_TABLET | ORAL | Status: DC | PRN

## 2018-12-15 MED ORDER — OXYTOCIN 40 UNITS IN NORMAL SALINE INFUSION - SIMPLE MED
2.5000 [IU]/h | INTRAVENOUS | Status: DC
  Administered 2018-12-15: 2.5 [IU]/h via INTRAVENOUS

## 2018-12-15 MED ORDER — FENTANYL 2.5 MCG/ML W/ROPIVACAINE 0.15% IN NS 100 ML EPIDURAL (ARMC)
EPIDURAL | Status: DC | PRN
  Administered 2018-12-15: 12 mL/h via EPIDURAL

## 2018-12-15 MED ORDER — BUTORPHANOL TARTRATE 2 MG/ML IJ SOLN
2.0000 mg | INTRAMUSCULAR | Status: DC | PRN
  Administered 2018-12-15 (×2): 2 mg via INTRAVENOUS
  Filled 2018-12-15 (×2): qty 1

## 2018-12-15 MED ORDER — PRENATAL MULTIVITAMIN CH
1.0000 | ORAL_TABLET | Freq: Every day | ORAL | Status: DC
  Administered 2018-12-16: 1 via ORAL
  Filled 2018-12-15: qty 1

## 2018-12-15 MED ORDER — IBUPROFEN 600 MG PO TABS
ORAL_TABLET | ORAL | Status: AC
  Administered 2018-12-15: 600 mg
  Filled 2018-12-15: qty 1

## 2018-12-15 MED ORDER — ONDANSETRON HCL 4 MG/2ML IJ SOLN: 4.0000 mg | INTRAMUSCULAR | Status: DC | PRN

## 2018-12-15 MED ORDER — ONDANSETRON HCL 4 MG/2ML IJ SOLN: 4.0000 mg | Freq: Four times a day (QID) | INTRAMUSCULAR | Status: DC | PRN

## 2018-12-15 MED ORDER — PENICILLIN G 3 MILLION UNITS IVPB - SIMPLE MED
3.0000 10*6.[IU] | INTRAVENOUS | Status: DC
  Administered 2018-12-15 (×2): 3 10*6.[IU] via INTRAVENOUS
  Filled 2018-12-15 (×6): qty 100

## 2018-12-15 MED ORDER — COCONUT OIL OIL: 1.0000 "application " | TOPICAL_OIL | Status: DC | PRN

## 2018-12-15 MED ORDER — LIDOCAINE HCL (PF) 1 % IJ SOLN
INTRAMUSCULAR | Status: DC | PRN
  Administered 2018-12-15: 3 mL

## 2018-12-15 MED ORDER — LIDOCAINE HCL (PF) 1 % IJ SOLN
INTRAMUSCULAR | Status: AC
  Filled 2018-12-15: qty 30

## 2018-12-15 MED ORDER — BENZOCAINE-MENTHOL 20-0.5 % EX AERO: 1.0000 "application " | INHALATION_SPRAY | CUTANEOUS | Status: DC | PRN

## 2018-12-15 MED ORDER — WITCH HAZEL-GLYCERIN EX PADS: 1.0000 "application " | MEDICATED_PAD | CUTANEOUS | Status: DC | PRN

## 2018-12-15 MED ORDER — ACETAMINOPHEN 325 MG PO TABS
650.0000 mg | ORAL_TABLET | ORAL | Status: DC | PRN
  Administered 2018-12-17: 09:00:00 650 mg via ORAL
  Filled 2018-12-15: qty 2

## 2018-12-15 MED ORDER — FERROUS SULFATE 325 (65 FE) MG PO TABS
325.0000 mg | ORAL_TABLET | Freq: Every day | ORAL | Status: DC
  Administered 2018-12-16 – 2018-12-17 (×2): 325 mg via ORAL
  Filled 2018-12-15 (×2): qty 1

## 2018-12-15 MED ORDER — IBUPROFEN 600 MG PO TABS
600.0000 mg | ORAL_TABLET | Freq: Four times a day (QID) | ORAL | Status: DC
  Administered 2018-12-16: 600 mg via ORAL
  Filled 2018-12-15: qty 1

## 2018-12-15 NOTE — Progress Notes (Signed)
L&D Progress Note    S: " I feel better." Had a little nap after 2 mgm Stadol. Feels more energized.  O:  General: slept off and on thru the last couple hours.  FHR: 135-140 baseline with moderate variability Toco: 4 contractions in 10 minutes with Pitocin 12 miu/min  Cervix: 4/80%/-2 to -1  A: IOL in progress  P: AROM-clear fluid FSE applied-frequent mild variables with contractions after AROM Stadol for pain Declines epidural at this time Monitor progress and fetal well being.   Dalia Heading, CNM

## 2018-12-15 NOTE — Progress Notes (Signed)
L&D Progress Note  S: Feeling crampy. Second dose of PCN almost infused.   O: BP (!) 133/77   Pulse 91   Temp 98.5 F (36.9 C) (Oral)   Resp 17   Ht 5\' 8"  (1.727 m)   Wt (!) 147 kg   LMP 03/16/2018   BMI 49.26 kg/m   General: in NAD  FHR; 130s with accelerations to 150s, moderate variability Toco: difficulty seeing contractions with patient lying on side, appears to be every 3-4 minutes on 8 miu/min Pitocin   Cervix: 2/75%/-2 to -3  A: IOL with some progress  P: During attempt at inserting foley balloon in cervix, cervix was manual dilated to 3cm. Aborted insertion of foley balloon Continue Pitocin induction Plan on AROM with next SVE Epidural if desired prior to AROM.  Dalia Heading, CNM

## 2018-12-15 NOTE — H&P (Addendum)
OB History & Physical   History of Present Illness:  Chief Complaint:  Presents for induction of labor. HPI:  [name] is a 33 y.o. [phone] female with EDC=12/22/2018 at 5144w0d dated by an 8wk1 d ultrasound.  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  She presents to L&D for induction of labor    [facility]renatal care [facility]  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 RPR titers on 4/6 and 5/4 were 1:8  Rhogam  not needed HBsAg: Negative (12/20 1636)   TDaP vaccine       5/18                HIV: Non Reactive (12/20 1636)   Flu Shot    Declines                            GBS: positive  Contraception  BTL- [x]  consents Okay with IUD or Nexplanon after birth until ventral hernia repair. PapWBC: NIL 2019  CBB  no WBC was up to 22.8K 4/11 but on 5/4 WBC 16.9K.  CS/VBAC  not needed   Baby Food  bottle   Support Person   significant other- [name]    OB History OB History  Gravida Para Term Preterm AB Living  6 2 2  0 3 2  SAB TAB Ectopic Multiple Live Births  2       2    # Outcome Date GA Lbr Len/2nd Weight Sex Delivery Anes PTL Lv  6 Current           5 SAB 07/25/17          4 Term 12/14/09 6612w0d  2722 g F Vag-Spont   LIV     Complications: Cervical cerclage suture present  3 Term 05/16/05 2312w0d  2268 g F Vag-Spont   LIV     Complications: Cervical cerclage  suture present  2 AB 2005 2266w0d   U Vag-Spont  Y FD     Complications: Incompetent cervix  1 SAB 2003            Obstetric Comments  History of cerclages with term pregnancies       Maternal Medical History:   Past Medical History:  Diagnosis Date  . GERD (gastroesophageal reflux disease)    with pregnancy-no meds  . Hypertension    lost weight and pcp took pt off bp meds 7 months  . Incompetent cervix   . Irregular periods   . Morbid obesity (HCC) 04/30/2017  . Obesity affecting pregnancy in second trimester 08/09/2018  . Syphilis affecting pregnancy   . Ventral hernia without obstruction or gangrene 04/30/2017    Past Surgical History:  Procedure Laterality Date  . CERVICAL  CERCLAGE N/A 06/17/2018   Procedure: CERCLAGE CERVICAL;  Surgeon: Homero Fellers, MD;  Location: [facility] ORS;  Service: Gynecology;  Laterality: N/A;  . CHOLECYSTECTOMY      No Known Allergies  Prior to Admission medications   Medication Sig Start Date End Date Taking? Authorizing Provider  calcium carbonate (TUMS - DOSED IN MG ELEMENTAL CALCIUM) 500 MG chewable tablet Chew 1 tablet by mouth daily.   Yes [provider]  prenatal vitamin w/FE, FA (NATACHEW) 29-1 MG CHEW chewable tablet Chew 1 tablet by mouth daily.    Yes [provider]  acetaminophen (TYLENOL) 325 MG tablet Take 650 mg by mouth every 6 (six) hours as needed.    [provider]          Social History: She  reports that she has never smoked. She has never used smokeless tobacco. She reports that she does not drink alcohol or use drugs.  Family History: family history includes Heart disease in her mother.   Review of Systems: Negative x 10 systems reviewed except as noted in the HPI.      Physical Exam:  Vital Signs: BP (!) 112/52   Pulse 91   Temp 98.5 F (36.9 C) (Oral)   Resp 18   Ht 5\' 8"  (1.727 m)   Wt (!) 147 kg   LMP 03/16/2018   BMI 49.26 kg/m  General: no acute distress.   HEENT: normocephalic, atraumatic Heart: regular rate & rhythm.  No murmurs/rubs/gallops Lungs: clear to auscultation bilaterally Abdomen: soft, gravid, large ventral hernia to the right of midline in mid to upper abdomen;  EFW: 7# ([place] EFW 6/1 5#5oz) Pelvic:   External: Normal external female genitalia  Cervix: Dilation: 2.5 / Effacement (%): 50, 60 / Station: -3 per RN exam  Extremities: non-tender, symmetric, pretibial edema bilaterally.  DTRs: +1 Neurologic: Alert & oriented x 3.   Baseline FHR: 135 with accelerations to 150s, moderate variability Toco: contractions every 2-6 min apart on Pitocin 3 min  Bedside Ultrasound:  Confirmed cephalic presentation/ ROT Assessment:  Donna Navarro is a 33 y.o. [id number] female at [redacted]w[redacted]d for induction of labor Bishop score=6 GBS positive Cat 1 tracing Ventral hernia Plan:  1. Admit to Labor & Delivery  2. CBC, T&S, Clrs, IVF, RPR 3. GBS positive- start PCN PPX 4. Consents obtained. 5. Pitocin induction already begun 6. Stadol for pain, epidural when appropriate 7. Desires BTL, but will need ventral hernia repair first 8. A POS/ RI/ VI 9. TDAP given 5/18 10. Bottle feeding  Dalia Heading  12/15/2018 9:21 AM

## 2018-12-15 NOTE — Discharge Summary (Signed)
Obstetric Discharge Summary Reason for Admission: induction of labor Prenatal Procedures: cerclage, NST and ultrasound Intrapartum Procedures: spontaneous vaginal delivery and GBS prophylaxis,  Pitocin induction, epidural, Artificial rupture of membranes, repair of left labial laceration Postpartum Procedures: DVT prophylaxis (Lovenox) Complications-Operative and Postpartum: none Hemoglobin  Date Value Ref Range Status  12/15/2018 11.3 (L) 12.0 - 15.0 g/dL Final  10/25/2018 11.7 11.1 - 15.9 g/dL Final   HCT  Date Value Ref Range Status  12/15/2018 35.5 (L) 36.0 - 46.0 % Final   Hematocrit  Date Value Ref Range Status  10/25/2018 36.3 34.0 - 46.6 % Final    Physical Exam:  General: alert, cooperative, appears stated age and no distress Lochia: appropriate Uterine Fundus: firm Incision: NA DVT Evaluation: No evidence of DVT seen on physical exam.  Discharge Diagnoses: Term Pregnancy-delivered  Discharge Information: Date: 12/15/2018 Activity: pelvic rest Diet: routine Medications: Prenatal vitamin, Lovenox, Acetaminophen PRN, Ibuprofen PRN Condition: stable Instructions: refer to practice specific booklet Discharge to: home   Newborn Data: Live born female [place] Birth Weight:  3520 g APGAR:8 ,9  Newborn Delivery   Birth date/time: 12/15/2018 17:54:00 Delivery type: Vaginal, Spontaneous    [facility] at 4 weeks postpartum. Call for appointment.   Home with mother.  Dalia Heading 12/15/2018, 6:30 PM

## 2018-12-15 NOTE — Anesthesia Procedure Notes (Signed)
Epidural Patient location during procedure: OB Start time: 12/15/2018 4:27 PM End time: 12/15/2018 4:43 PM  Staffing Anesthesiologist: Gunnar Fusi, MD Resident/CRNA: Johnna Acosta, CRNA Performed: resident/CRNA   Preanesthetic Checklist Completed: patient identified, site marked, surgical consent, pre-op evaluation, timeout performed, IV checked, risks and benefits discussed and monitors and equipment checked  Epidural Patient position: sitting Prep: ChloraPrep Patient monitoring: heart rate, continuous pulse ox and blood pressure Approach: midline Location: L3-L4 Injection technique: LOR saline  Needle:  Needle type: Tuohy  Needle gauge: 17 G Needle length: 9 cm and 9 Needle insertion depth: 9.5 cm Catheter type: closed end flexible Catheter size: 19 Gauge Test dose: negative and 1.5% lidocaine with Epi 1:200 K  Assessment Sensory level: T10 Events: blood not aspirated, injection not painful, no injection resistance, negative IV test and no paresthesia  Additional Notes 1 attempt Pt. Evaluated and documentation done after procedure finished. Patient identified. Risks/Benefits/Options discussed with patient including but not limited to bleeding, infection, nerve damage, paralysis, failed block, incomplete pain control, headache, blood pressure changes, nausea, vomiting, reactions to medication both or allergic, itching and postpartum back pain. Confirmed with bedside nurse the patient's most recent platelet count. Confirmed with patient that they are not currently taking any anticoagulation, have any bleeding history or any family history of bleeding disorders. Patient expressed understanding and wished to proceed. All questions were answered. Sterile technique was used throughout the entire procedure. Please see nursing notes for vital signs. Test dose was given through epidural catheter and negative prior to continuing to dose epidural or start infusion. Warning  signs of high block given to the patient including shortness of breath, tingling/numbness in hands, complete motor block, or any concerning symptoms with instructions to call for help. Patient was given instructions on fall risk and not to get out of bed. All questions and concerns addressed with instructions to call with any issues or inadequate analgesia.   Patient tolerated the insertion well without immediate complications.Reason for block:procedure for pain

## 2018-12-15 NOTE — Anesthesia Preprocedure Evaluation (Signed)
Anesthesia Evaluation  Patient identified by MRN, date of birth, ID band Patient awake    Reviewed: Allergy & Precautions, H&P , NPO status , Patient's Chart, lab work & pertinent test results  Airway Mallampati: III  TM Distance: >3 FB Neck ROM: full  Mouth opening: Limited Mouth Opening  Dental no notable dental hx.    Pulmonary neg pulmonary ROS,    Pulmonary exam normal        Cardiovascular hypertension, Normal cardiovascular exam     Neuro/Psych negative psych ROS   GI/Hepatic Neg liver ROS, GERD  Controlled,  Endo/Other  Morbid obesity  Renal/GU negative Renal ROS  negative genitourinary   Musculoskeletal   Abdominal   Peds  Hematology negative hematology ROS (+)   Anesthesia Other Findings   Reproductive/Obstetrics (+) Pregnancy                             Anesthesia Physical Anesthesia Plan  ASA: III  Anesthesia Plan: Epidural   Post-op Pain Management:    Induction:   PONV Risk Score and Plan:   Airway Management Planned:   Additional Equipment:   Intra-op Plan:   Post-operative Plan:   Informed Consent: I have reviewed the patients History and Physical, chart, labs and discussed the procedure including the risks, benefits and alternatives for the proposed anesthesia with the patient or authorized representative who has indicated his/her understanding and acceptance.       Plan Discussed with: Anesthesiologist  Anesthesia Plan Comments:         Anesthesia Quick Evaluation

## 2018-12-16 LAB — CREATININE, SERUM
Creatinine, Ser: 0.69 mg/dL (ref 0.44–1.00)
GFR calc Af Amer: >60 mL/min (ref >60)
GFR calc non Af Amer: >60 mL/min (ref >60)

## 2018-12-16 LAB — CBC
HCT: 31.5 % — ABNORMAL LOW (ref 36.0–46.0)
Hemoglobin: 10.1 g/dL — ABNORMAL LOW (ref 12.0–15.0)
MCH: 26.8 pg (ref 26.0–34.0)
MCHC: 32.1 g/dL (ref 30.0–36.0)
MCV: 83.6 fL (ref 80.0–100.0)
Platelets: 302 10*3/uL (ref 150–400)
RBC: 3.77 MIL/uL — ABNORMAL LOW (ref 3.87–5.11)
RDW: 14.4 % (ref 11.5–15.5)
WBC: 22.1 10*3/uL — ABNORMAL HIGH (ref 4.0–10.5)
nRBC: 0 % (ref 0.0–0.2)

## 2018-12-16 LAB — RPR: RPR Ser Ql: REACTIVE — AB

## 2018-12-16 LAB — RPR, QUANT+TP ABS (REFLEX)
Rapid Plasma Reagin, Quant: 1:4 {titer} — ABNORMAL HIGH
T Pallidum Abs: REACTIVE — AB

## 2018-12-16 MED ORDER — OXYTOCIN 40 UNITS IN NORMAL SALINE INFUSION - SIMPLE MED
INTRAVENOUS | Status: AC
  Administered 2018-12-16: 01:00:00
  Filled 2018-12-16: qty 1000

## 2018-12-16 MED ORDER — IBUPROFEN 600 MG PO TABS
600.0000 mg | ORAL_TABLET | Freq: Four times a day (QID) | ORAL | Status: DC
  Administered 2018-12-16 – 2018-12-17 (×4): 600 mg via ORAL
  Filled 2018-12-16 (×4): qty 1

## 2018-12-16 MED ORDER — ENOXAPARIN SODIUM 60 MG/0.6ML ~~LOC~~ SOLN
60.0000 mg | SUBCUTANEOUS | Status: DC
  Administered 2018-12-16: 60 mg via SUBCUTANEOUS
  Filled 2018-12-16 (×2): qty 0.6

## 2018-12-16 NOTE — Progress Notes (Signed)
Admit Date: 12/15/2018 Today's Date: 12/16/2018  Post Partum Day 1  Subjective:  no complaints, up ad lib, voiding, tolerating PO, + flatus and + bowel movement  Objective: Temp:  [98 F (36.7 C)-98.8 F (37.1 C)] 98.6 F (37 C) (06/25 1147) Pulse Rate:  [75-110] 78 (06/25 1147) Resp:  [18-20] 20 (06/25 1147) BP: (92-146)/(47-114) 121/88 (06/25 1147) SpO2:  [87 %-100 %] 100 % (06/25 0809)  Physical Exam:  General: alert and cooperative Lochia: appropriate Uterine Fundus: firm Incision: none DVT Evaluation: No evidence of DVT seen on physical exam.  Recent Labs    12/15/18 0922 12/16/18 0511  HGB 11.3* 10.1*  HCT 35.5* 31.5*    Assessment/Plan: 34 yo s/p SVD PPD #1 1. Will start DVT propphylaxis with lovenox today. Will continue for at least 10 days postpartum. 2. Elevated WBC count, has been persistently elevated, will follow up at postpartum visit. Will check at 6wk PP visit and consider hematology referral.  3. Discussed IUD for birthh control postpartum until she can have a tubal ligation. Will follow up for placement.  4. Bottle feeding 5. Rubella immune, Varicella Immune, TDAP up to date.  6. Bottle Feeding and Infant doing well 7. Anticipate discharge home tomorrow   LOS: 1 day   Crystal 12/16/2018, 1:03 PM

## 2018-12-16 NOTE — Lactation Note (Signed)
This note was copied from a baby's chart. Lactation Consultation Note  Patient Name: Donna Navarro [id number] Date: 12/16/2018     Maternal Data    Feeding    LATCH Score                   Interventions    Lactation Tools Discussed/Used     Consult Status  Lactation spoke with mother about infant's feedings. Mother has chosen to bottle feed formula. Mother states she feels that infant is not taking much from the bottle and has been spitting up clear and white emesis. Keener educated about paced bottle feeding and avoidance of overfeeding infant. LC encouraged mother to start with lower amounts (5-68mL) since infant is less than 58 hours old. Sidney educated about cluster feeding and stated that infant should start taking more closer to day 2. Mother is connected with [facility]. LC reviewed instructions on how to mix infant formula. Mother denies any additional concerns at this time.    Elvera Lennox 0/38/8828, 11:17 AM

## 2018-12-16 NOTE — Anesthesia Postprocedure Evaluation (Signed)
Anesthesia Post Note  Patient: Donna Navarro  Procedure(s) Performed: AN AD HOC LABOR EPIDURAL  Patient location during evaluation: Mother Baby Anesthesia Type: Epidural Level of consciousness: awake and alert Pain management: pain level controlled Vital Signs Assessment: post-procedure vital signs reviewed and stable Respiratory status: spontaneous breathing, nonlabored ventilation and respiratory function stable Cardiovascular status: stable Postop Assessment: no headache, no backache and epidural receding Anesthetic complications: no     Last Vitals:  Vitals:   12/15/18 2306 12/16/18 0320  BP: (!) 146/80 130/75  Pulse: 76 75  Resp: 18   Temp: 36.8 C 36.9 C  SpO2: 99% 99%    Last Pain:  Vitals:   12/16/18 0320  TempSrc: Oral  PainSc:                  Hedda Slade

## 2018-12-16 NOTE — Progress Notes (Signed)
   Subjective:  Doing well, minimal lochia.  Tolerating po.  Objective:  Vital signs in last 24 hours: Temp:  [98 F (36.7 C)-98.8 F (37.1 C)] 98.6 F (37 C) (06/25 1147) Pulse Rate:  [75-110] 78 (06/25 1147) Resp:  [18-20] 20 (06/25 1147) BP: (92-146)/(47-114) 121/88 (06/25 1147) SpO2:  [87 %-100 %] 100 % (06/25 0809)    General: NAD Pulmonary: no increased work of breathing Abdomen: non-distended, non-tender, fundus firm at level of umbilicus Extremities: no edema, no erythema, no tenderness  Results for orders placed or performed during the hospital encounter of 12/15/18 (from the past 72 hour(s))  CBC     Status: Abnormal   Collection Time: 12/15/18  9:22 AM  Result Value Ref Range   WBC 18.4 (H) 4.0 - 10.5 K/uL   RBC 4.24 3.87 - 5.11 MIL/uL   Hemoglobin 11.3 (L) 12.0 - 15.0 g/dL   HCT 35.5 (L) 36.0 - 46.0 %   MCV 83.7 80.0 - 100.0 fL   MCH 26.7 26.0 - 34.0 pg   MCHC 31.8 30.0 - 36.0 g/dL   RDW 14.4 11.5 - 15.5 %   Platelets 345 150 - 400 K/uL   nRBC 0.0 0.0 - 0.2 %    Comment: Performed at [facility], [address] [place], [place] [address]  Type and screen [place]     Status: None   Collection Time: 12/15/18  9:22 AM  Result Value Ref Range   ABO/RH(D) A POS    Antibody Screen NEG    Sample Expiration      12/18/2018,2359 Performed at [facility], [address] [place], [place] [address]   CBC     Status: Abnormal   Collection Time: 12/16/18  5:11 AM  Result Value Ref Range   WBC 22.1 (H) 4.0 - 10.5 K/uL   RBC 3.77 (L) 3.87 - 5.11 MIL/uL   Hemoglobin 10.1 (L) 12.0 - 15.0 g/dL   HCT 31.5 (L) 36.0 - 46.0 %   MCV 83.6 80.0 - 100.0 fL   MCH 26.8 26.0 - 34.0 pg   MCHC 32.1 30.0 - 36.0 g/dL   RDW 14.4 11.5 - 15.5 %   Platelets 302 150 - 400 K/uL   nRBC 0.0 0.0 - 0.2 %    Comment: Performed at [facility], [address] [place], [place] [address]  Creatinine, serum     Status: None   Collection Time: 12/16/18  5:11 AM  Result Value Ref Range   Creatinine, Ser 0.69 0.44 - 1.00 mg/dL   GFR calc non Af Amer >60 >60 mL/min   GFR calc Af Amer >60 >60 mL/min    Comment: Performed at [facility], [address], [place], [place] [address]    Assessment:   33 y.o. [phone] postpartum day # 1 TSVD  Plan:    1) Acute blood loss anemia - hemodynamically stable and asymptomatic - po ferrous sulfate  2) Blood Type --/--/A POS (06/24 3710) / Rubella 2.87 (12/20 1636) / Varicella Immune  3) TDAP status up to date  4) Feeding plan bottle  5)  Education given regarding options for contraception, as well as compatibility with breast feeding if applicable.  Patient plans on tubal ligation for contraception.  6) Disposition  Malachy Mood, MD, [facility], [facility] 12/16/2018, 1:09 PM

## 2018-12-17 ENCOUNTER — Encounter: Payer: Medicaid Other | Admitting: Certified Nurse Midwife

## 2018-12-17 ENCOUNTER — Ambulatory Visit: Payer: Medicaid Other

## 2018-12-17 MED ORDER — ENOXAPARIN SODIUM 60 MG/0.6ML ~~LOC~~ SOLN: 60.0000 mg | SUBCUTANEOUS | 0 refills | Status: DC

## 2018-12-17 MED ORDER — IBUPROFEN 600 MG PO TABS
600.0000 mg | ORAL_TABLET | Freq: Four times a day (QID) | ORAL | Status: DC
  Administered 2018-12-17: 600 mg via ORAL
  Filled 2018-12-17: qty 1

## 2018-12-17 MED ORDER — IBUPROFEN 600 MG PO TABS: 600.0000 mg | ORAL_TABLET | Freq: Four times a day (QID) | ORAL | 0 refills | Status: DC | PRN

## 2018-12-17 NOTE — Progress Notes (Signed)
DC inst reviewed with pt.  Verb u/o of f/u care and postpartum care.

## 2018-12-17 NOTE — Discharge Instructions (Signed)
°Vaginal Delivery, Care After °Refer to this sheet in the next few weeks. These discharge instructions provide you with information on caring for yourself after delivery. Your caregiver may also give you specific instructions. Your treatment has been planned according to the most current medical practices available, but problems sometimes occur. Call your caregiver if you have any problems or questions after you go home. °HOME CARE INSTRUCTIONS °1. Take over-the-counter or prescription medicines only as directed by your caregiver or pharmacist. °2. Do not drink alcohol, especially if you are breastfeeding or taking medicine to relieve pain. °3. Do not smoke tobacco. °4. Continue to use good perineal care. Good perineal care includes: °1. Wiping your perineum from back to front °2. Keeping your perineum clean. °3. You can do sitz baths twice a day, to help keep this area clean °5. Do not use tampons, douche or have sex for 6 weeks °6. Shower only and avoid sitting in submerged water, aside from sitz baths °7. Wear a well-fitting bra that provides breast support. °8. Eat healthy foods. °9. Drink enough fluids to keep your urine clear or pale yellow. °10. Eat high-fiber foods such as whole grain cereals and breads, brown rice, beans, and fresh fruits and vegetables every day. These foods may help prevent or relieve constipation. °11. Avoid constipation with high fiber foods or medications, such as miralax or metamucil °12. Follow your caregiver's recommendations regarding resumption of activities such as climbing stairs, driving, lifting, exercising, or traveling. °13. Talk to your caregiver about resuming sexual activities. Resumption of sexual activities after 6 weeks is dependent upon your risk of infection, your rate of healing, and your comfort and desire to resume sexual activity. °14. Try to have someone help you with your household activities and your newborn for at least a few days after you leave the  hospital. °15. Rest as much as possible. Try to rest or take a nap when your newborn is sleeping. °16. Increase your activities gradually. °17. Keep all of your scheduled postpartum appointments. It is very important to keep your scheduled follow-up appointments. At these appointments, your caregiver will be checking to make sure that you are healing physically and emotionally. °SEEK MEDICAL CARE IF:  °· You are passing large clots from your vagina. Save any clots to show your caregiver. °· You have a foul smelling discharge from your vagina. °· You have trouble urinating. °· You are urinating frequently. °· You have pain when you urinate. °· You have a change in your bowel movements. °· You have increasing redness, pain, or swelling near your vaginal incision (episiotomy) or vaginal tear. °· You have pus draining from your episiotomy or vaginal tear. °· Your episiotomy or vaginal tear is separating. °· You have painful, hard, or reddened breasts. °· You have a severe headache. °· You have blurred vision or see spots. °· You feel sad or depressed. °· You have thoughts of hurting yourself or your newborn. °· You have questions about your care, the care of your newborn, or medicines. °· You are dizzy or light-headed. °· You have a rash. °· You have nausea or vomiting. °· You were breastfeeding and have not had a menstrual period within 12 weeks after you stopped breastfeeding. °· You are not breastfeeding and have not had a menstrual period by the 12th week after delivery. °· You have a fever of 100.5 or more °SEEK IMMEDIATE MEDICAL CARE IF:  °· You have persistent pain. °· You have chest pain. °· You have shortness   of breath.  You faint.  You have leg pain.  You have stomach pain.  Your vaginal bleeding saturates two or more sanitary pads in 1 hour. MAKE SURE YOU:   Understand these instructions.  Will watch your condition.  Will get help right away if you are not doing well or get worse. Document  Released: 06/06/2000 Document Revised: 10/24/2013 Document Reviewed: 02/04/2012 [facility] Patient Information 2015 [place], [place]. This information is not intended to replace advice given to you by your health care provider. Make sure you discuss any questions you have with your health care provider.  Sitz Bath A sitz bath is a warm water bath taken in the sitting position. The water covers only the hips and butt (buttocks). We recommend using one that fits in the toilet, to help with ease of use and cleanliness. It may be used for either healing or cleaning purposes. Sitz baths are also used to relieve pain, itching, or muscle tightening (spasms). The water may contain medicine. Moist heat will help you heal and relax.  HOME CARE  Take 3 to 4 sitz baths a day. 18. Fill the bathtub half-full with warm water. 19. Sit in the water and open the drain a little. 20. Turn on the warm water to keep the tub half-full. Keep the water running constantly. 21. Soak in the water for 15 to 20 minutes. 22. After the sitz bath, pat the affected area dry. GET HELP RIGHT AWAY IF: You get worse instead of better. Stop the sitz baths if you get worse. MAKE SURE YOU:  Understand these instructions.  Will watch your condition.  Will get help right away if you are not doing well or get worse. Document Released: 07/17/2004 Document Revised: 03/03/2012 Document Reviewed: 10/07/2010 [facility] Patient Information 2015 [place]Care, [place]. This information is not intended to replace advice given to you by your health care provider. Make sure you discuss any questions you have with your health care provider.   Enoxaparin injection What is this medicine? ENOXAPARIN (ee nox a PA rin) is used after some major surgeries and sometimes after deliveries to prevent blood clotting. It is also used to treat existing blood clots in the lungs or in the veins. This medicine may be used for other purposes; ask your health care provider  or pharmacist if you have questions. COMMON BRAND NAME(S): Lovenox What should I tell my health care provider before I take this medicine? They need to know if you have any of these conditions: -bleeding disorders, hemorrhage, or hemophilia -infection of the heart or heart valves -kidney or liver disease -previous stroke -prosthetic heart valve -recent surgery or delivery of a baby -ulcer in the stomach or intestine, diverticulitis, or other bowel disease -an unusual or allergic reaction to enoxaparin, heparin, pork or pork products, other medicines, foods, dyes, or preservatives -pregnant or trying to get pregnant -breast-feeding How should I use this medicine? This medicine is for injection under the skin. It is usually given by a health-care professional. You or a family member may be trained on how to give the injections. If you are to give yourself injections, make sure you understand how to use the syringe, measure the dose if necessary, and give the injection. To avoid bruising, do not rub the site where this medicine has been injected. Do not take your medicine more often than directed. Do not stop taking except on the advice of your doctor or health care professional. Make sure you receive a puncture-resistant container to dispose  of the needles and syringes once you have finished with them. Do not reuse these items. Return the container to your doctor or health care professional for proper disposal. Talk to your pediatrician regarding the use of this medicine in children. Special care may be needed. Overdosage: If you think you have taken too much of this medicine contact a poison control center or emergency room at once. NOTE: This medicine is only for you. Do not share this medicine with others. What if I miss a dose? If you miss a dose, take it as soon as you can. If it is almost time for your next dose, take only that dose. Do not take double or extra doses. What may interact with  this medicine? -aspirin and aspirin-like medicines -certain medicines that treat or prevent blood clots -dipyridamole -NSAIDs, medicines for pain and inflammation, like ibuprofen or naproxen This list may not describe all possible interactions. Give your health care provider a list of all the medicines, herbs, non-prescription drugs, or dietary supplements you use. Also tell them if you smoke, drink alcohol, or use illegal drugs. Some items may interact with your medicine. What should I watch for while using this medicine? Visit your healthcare professional for regular checks on your progress. You may need blood work done while you are taking this medicine. Your condition will be monitored carefully while you are receiving this medicine. It is important not to miss any appointments. If you are going to need surgery or other procedure, tell your healthcare professional that you are using this medicine. Using this medicine for a long time may weaken your bones and increase the risk of bone fractures. Avoid sports and activities that might cause injury while you are using this medicine. Severe falls or injuries can cause unseen bleeding. Be careful when using sharp tools or knives. Consider using an Copy. Take special care brushing or flossing your teeth. Report any injuries, bruising, or red spots on the skin to your healthcare professional. Wear a medical ID bracelet or chain. Carry a card that describes your disease and details of your medicine and dosage times. What side effects may I notice from receiving this medicine? Side effects that you should report to your doctor or health care professional as soon as possible: -allergic reactions like skin rash, itching or hives, swelling of the face, lips, or tongue -bone pain -signs and symptoms of bleeding such as bloody or black, tarry stools; red or dark-brown urine; spitting up blood or brown material that looks like coffee grounds; red  spots on the skin; unusual bruising or bleeding from the eye, gums, or nose -signs and symptoms of a blood clot such as chest pain; shortness of breath; pain, swelling, or warmth in the leg -signs and symptoms of a stroke such as changes in vision; confusion; trouble speaking or understanding; severe headaches; sudden numbness or weakness of the face, arm or leg; trouble walking; dizziness; loss of coordination Side effects that usually do not require medical attention (report to your doctor or health care professional if they continue or are bothersome): -hair loss -pain, redness, or irritation at site where injected This list may not describe all possible side effects. Call your doctor for medical advice about side effects. You may report side effects to FDA at 1-800-FDA-1088. Where should I keep my medicine? Keep out of the reach of children. Store at room temperature between 15 and 30 degrees C (59 and 86 degrees F). Do not freeze. If your injections  have been specially prepared, you may need to store them in the refrigerator. Ask your pharmacist. Throw away any unused medicine after the expiration date. NOTE: This sheet is a summary. It may not cover all possible information. If you have questions about this medicine, talk to your doctor, pharmacist, or health care provider.  2019 Elsevier/Gold Standard (2017-06-04 11:25:34)   Postpartum Baby Blues The postpartum period begins right after the birth of a baby. During this time, there is often a lot of joy and excitement. It is also a time of many changes in the life of the parents. No matter how many times a mother gives birth, each child brings new challenges to the family, including different ways of relating to one another. It is common to have feelings of excitement along with confusing changes in moods, emotions, and thoughts. You may feel happy one minute and sad or stressed the next. These feelings of sadness usually happen in the period  right after you have your baby, and they go away within a week or two. This is called the "baby blues." What are the causes? There is no known cause of baby blues. It is likely caused by a combination of factors. However, changes in hormone levels after childbirth are believed to trigger some of the symptoms. Other factors that can play a role in these mood changes include:  Lack of sleep.  Stressful life events, such as poverty, caring for a loved one, or death of a loved one.  Genetics. What are the signs or symptoms? Symptoms of this condition include:  Brief changes in mood, such as going from extreme happiness to sadness.  Decreased concentration.  Difficulty sleeping.  Crying spells and tearfulness.  Loss of appetite.  Irritability.  Anxiety. If the symptoms of baby blues last for more than 2 weeks or become more severe, you may have postpartum depression. How is this diagnosed? This condition is diagnosed based on an evaluation of your symptoms. There are no medical or lab tests that lead to a diagnosis, but there are various questionnaires that a health care provider may use to identify women with the baby blues or postpartum depression. How is this treated? Treatment is not needed for this condition. The baby blues usually go away on their own in 1-2 weeks. Social support is often all that is needed. You will be encouraged to get adequate sleep and rest. Follow these instructions at home: Lifestyle      Get as much rest as you can. Take a nap when the baby sleeps.  Exercise regularly as told by your health care provider. Some women find yoga and walking to be helpful.  Eat a balanced and nourishing diet. This includes plenty of fruits and vegetables, whole grains, and lean proteins.  Do little things that you enjoy. Have a cup of tea, take a bubble bath, read your favorite magazine, or listen to your favorite music.  Avoid alcohol.  Ask for help with household  chores, cooking, grocery shopping, or running errands. Do not try to do everything yourself. Consider hiring a postpartum doula to help. This is a professional who specializes in providing support to new mothers.  Try not to make any major life changes during pregnancy or right after giving birth. This can add stress. General instructions  Talk to people close to you about how you are feeling. Get support from your partner, family members, friends, or other new moms. You may want to join a support group.  Find ways to cope with stress. This may include: ? Writing your thoughts and feelings in a journal. ? Spending time outside. ? Spending time with people who make you laugh.  Try to stay positive in how you think. Think about the things you are grateful for.  Take over-the-counter and prescription medicines only as told by your health care provider.  Let your health care provider know if you have any concerns.  Keep all postpartum visits as told by your health care provider. This is important. Contact a health care provider if:  Your baby blues do not go away after 2 weeks. Get help right away if:  You have thoughts of taking your own life (suicidal thoughts).  You think you may harm the baby or other people.  You see or hear things that are not there (hallucinations). Summary  After giving birth, you may feel happy one minute and sad or stressed the next. Feelings of sadness that happen right after the baby is born and go away after a week or two are called the "baby blues."  You can manage the baby blues by getting enough rest, eating a healthy diet, exercising, spending time with supportive people, and finding ways to cope with stress.  If feelings of sadness and stress last longer than 2 weeks or get in the way of caring for your baby, talk to your health care provider. This may mean you have postpartum depression. This information is not intended to replace advice given to  you by your health care provider. Make sure you discuss any questions you have with your health care provider. Document Released: 03/13/2004 Document Revised: 08/05/2016 Document Reviewed: 08/05/2016 Elsevier Interactive Patient Education  2019 [employer]

## 2018-12-18 LAB — RPR: RPR Ser Ql: REACTIVE — AB

## 2018-12-20 LAB — T.PALLIDUM AB, TOTAL: T Pallidum Abs: REACTIVE — AB

## 2018-12-21 LAB — RPR, QUANT. (REFLEX): Rapid Plasma Reagin, Quant: 1:4 {titer}

## 2018-12-22 ENCOUNTER — Ambulatory Visit: Payer: Medicaid Other | Admitting: Surgery

## 2019-01-17 ENCOUNTER — Ambulatory Visit: Payer: Medicaid Other | Admitting: Surgery

## 2019-01-26 ENCOUNTER — Ambulatory Visit (INDEPENDENT_AMBULATORY_CARE_PROVIDER_SITE_OTHER): Payer: Medicaid Other | Admitting: Surgery

## 2019-01-26 ENCOUNTER — Encounter: Payer: Self-pay | Admitting: Surgery

## 2019-01-26 ENCOUNTER — Other Ambulatory Visit: Payer: Self-pay

## 2019-01-26 VITALS — BP 172/115 | HR 82 | Temp 97.3°F | Resp 16 | Ht 66.0 in

## 2019-01-26 DIAGNOSIS — K436 Other and unspecified ventral hernia with obstruction, without gangrene: Secondary | ICD-10-CM

## 2019-01-26 NOTE — Patient Instructions (Addendum)
The patient is aware to call back for any questions or new concerns.    Ventral Hernia  A ventral hernia is a bulge of tissue from inside the abdomen that pushes through a weak area of the muscles that form the front wall of the abdomen. The tissues inside the abdomen are inside a sac (peritoneum). These tissues include the small intestine, large intestine, and the fatty tissue that covers the intestines (omentum). Sometimes, the bulge that forms a hernia contains intestines. Other hernias contain only fat. Ventral hernias do not go away without surgical treatment. There are several types of ventral hernias. You may have:  A hernia at an incision site from previous abdominal surgery (incisional hernia).  A hernia just above the belly button (epigastric hernia), or at the belly button (umbilical hernia). These types of hernias can develop from heavy lifting or straining.  A hernia that comes and goes (reducible hernia). It may be visible only when you lift or strain. This type of hernia can be pushed back into the abdomen (reduced).  A hernia that traps abdominal tissue inside the hernia (incarcerated hernia). This type of hernia does not reduce.  A hernia that cuts off blood flow to the tissues inside the hernia (strangulated hernia). The tissues can start to die if this happens. This is a very painful bulge that cannot be reduced. A strangulated hernia is a medical emergency. What are the causes? This condition is caused by abdominal tissue putting pressure on an area of weakness in the abdominal muscles. What increases the risk? The following factors may make you more likely to develop this condition:  Being female.  Being 4960 or older.  Being overweight or obese.  Having had previous abdominal surgery, especially if there was an infection after surgery.  Having had an injury to the abdominal wall.  Having had several pregnancies.  Having a buildup of fluid inside the abdomen  (ascites). What are the signs or symptoms? The only symptom of a ventral hernia may be a painless bulge in the abdomen. A reducible hernia may be visible only when you strain, cough, or lift. Other symptoms may include:  Dull pain.  A feeling of pressure. Signs and symptoms of a strangulated hernia may include:  Increasing pain.  Nausea and vomiting.  Pain when pressing on the hernia.  The skin over the hernia turning red or purple.  Constipation.  Blood in the stool (feces). How is this diagnosed? This condition may be diagnosed based on:  Your symptoms.  Your medical history.  A physical exam. You may be asked to cough or strain while standing. These actions increase the pressure inside your abdomen and force the hernia through the opening in your muscles. Your health care provider may try to reduce the hernia by pressing on it.  Imaging studies, such as an ultrasound or CT scan. How is this treated? This condition is treated with surgery. If you have a strangulated hernia, surgery is done as soon as possible. If your hernia is small and not incarcerated, you may be asked to lose some weight before surgery. Follow these instructions at home:  Follow instructions from your health care provider about eating or drinking restrictions.  If you are overweight, your health care provider may recommend that you increase your activity level and eat a healthier diet.  Do not lift anything that is heavier than 10 lb (4.5 kg).  Return to your normal activities as told by your health care provider. Ask  your health care provider what activities are safe for you. You may need to avoid activities that increase pressure on your hernia.  Take over-the-counter and prescription medicines only as told by your health care provider.  Keep all follow-up visits as told by your health care provider. This is important. Contact a health care provider if:  Your hernia gets larger.  Your hernia  becomes painful. Get help right away if:  Your hernia becomes increasingly painful.  You have pain along with any of the following: ? Changes in skin color in the area of the hernia. ? Nausea. ? Vomiting. ? Fever. Summary  A ventral hernia is a bulge of tissue from inside the abdomen that pushes through a weak area of the muscles that form the front wall of the abdomen.  This condition is treated with surgery, which may be urgent depending on your hernia.  Do not lift anything that is heavier than 10 lb (4.5 kg), and follow activity instructions from your health care provider. This information is not intended to replace advice given to you by your health care provider. Make sure you discuss any questions you have with your health care provider. Document Released: 05/26/2012 Document Revised: 07/22/2017 Document Reviewed: 12/29/2016 Elsevier Patient Education  2020 [employer].

## 2019-01-27 NOTE — Progress Notes (Signed)
Patient ID: Donna Navarro, female   DOB: 1986-03-27, 33 y.o.   MRN: [id number]  HPI Donna Navarro is a 33 y.o. female seen for a ventral hernia.  She is status post vaginal delivery 6 weeks ago.  She did have laparoscopic cholecystectomy several years ago and since then developed a small hernia.  Now hernia is large and is symptomatic she reports intermittent abdominal pain that is moderate in intensity and sharp in nature.  Worsening with Valsalva.  No fevers no chills.  She recently had a vaginal delivery without any issues.  She is able to perform more than 4 METS of activity without any shortness of breath or chest pain.  CT scan personally reviewed showing evidence of a large ventral hernia with transverse colon within the sac.  SHe does have a normal creatinine normal hemoglobin and normal platelets.  HPI  Past Medical History:  Diagnosis Date  . GERD (gastroesophageal reflux disease)    with pregnancy-no meds  . Hypertension    lost weight and pcp took pt off bp meds 7 months  . Incompetent cervix   . Irregular periods   . Morbid obesity (HCC) 04/30/2017  . Obesity affecting pregnancy in second trimester 08/09/2018  . Syphilis affecting pregnancy   . Ventral hernia without obstruction or gangrene 04/30/2017    Past Surgical History:  Procedure Laterality Date  . CERVICAL CERCLAGE N/A 06/17/2018   Procedure: CERCLAGE CERVICAL;  Surgeon: Natale MilchSchuman, Christanna R, MD;  Location: [facility] ORS;  Service: Gynecology;  Laterality: N/A;  . CHOLECYSTECTOMY      Family History  Problem Relation Age of Onset  . Heart disease Mother        Massive Heart Attack    Social History Social History   Tobacco Use  . Smoking status: Never Smoker  . Smokeless tobacco: Never Used  Substance Use Topics  . Alcohol use: Never    Frequency: Never  . Drug use: Never    No Known Allergies  Current Outpatient Medications  Medication Sig Dispense Refill  . acetaminophen (TYLENOL) 325 MG tablet  Take 650 mg by mouth every 6 (six) hours as needed.    Marland Kitchen. ibuprofen (ADVIL) 600 MG tablet Take 1 tablet (600 mg total) by mouth every 6 (six) hours as needed for cramping. 20 tablet 0   No current facility-administered medications for this visit.      Review of Systems Full ROS  was asked and was negative except for the information on the HPI  Physical Exam Blood pressure (!) 172/115, pulse 82, temperature (!) 97.3 F (36.3 C), temperature source Temporal, resp. rate 16, height 5\' 6"  (1.676 m), SpO2 99 %, Donna if currently breastfeeding. CONSTITUTIONAL: obese EYES: Pupils are equal, round, and reactive to light, Sclera are non-icteric. EARS, NOSE, MOUTH AND THROAT: The oropharynx is clear. The oral mucosa is pink and moist. Hearing is intact to voice. LYMPH NODES:  Lymph nodes in the neck are normal. RESPIRATORY:  Lungs are clear. There is normal respiratory effort, with equal breath sounds bilaterally, and without pathologic use of accessory muscles. CARDIOVASCULAR: Heart is regular without murmurs, gallops, or rubs. GI: The abdomen is soft, tonically incarcerated and soft 5 cm ventral hernia.  This is there are no palpable masses. There is no hepatosplenomegaly. There are normal bowel sounds in all quadrants. GU: Rectal deferred.   MUSCULOSKELETAL: Normal muscle strength and tone. No cyanosis or edema.   SKIN: Turgor is good and there are no pathologic skin lesions  or ulcers. NEUROLOGIC: Motor and sensation is grossly normal. Cranial nerves are grossly intact. PSYCH:  Oriented to person, place and time. Affect is normal.  Data Reviewed  I have personally reviewed the patient's imaging, laboratory findings and medical records.    Assessment/Plan 33 year old morbidly obese female with a large ventral hernia that is symptomatic.  She is currently 300 pounds today.  I had an extensive discussion with the patient about weight management.  I do think that she will be a good candidate for  robotic surgery but ideally she will need to lose weight.  We set very realistic expectations and I told her that if she was able to go to 280 I will proceed with elective hernia repair.  Risk benefits and possible complications discussed with the patient in detail.  She understands and she is committed to a weight loss program.  I will see her back in about 4 weeks or so and hopefully at that time her weight might be in a better state. Extensive counseling provided to the patient and the husband.   Caroleen Hamman, MD FACS General Surgeon 01/27/2019, 12:49 PM

## 2019-01-31 ENCOUNTER — Other Ambulatory Visit: Payer: Self-pay

## 2019-01-31 ENCOUNTER — Ambulatory Visit (LOCAL_COMMUNITY_HEALTH_CENTER): Payer: Self-pay

## 2019-01-31 DIAGNOSIS — Z111 Encounter for screening for respiratory tuberculosis: Secondary | ICD-10-CM

## 2019-02-03 ENCOUNTER — Ambulatory Visit (LOCAL_COMMUNITY_HEALTH_CENTER): Payer: Medicaid Other

## 2019-02-03 ENCOUNTER — Other Ambulatory Visit: Payer: Self-pay

## 2019-02-03 DIAGNOSIS — Z111 Encounter for screening for respiratory tuberculosis: Secondary | ICD-10-CM

## 2019-02-03 LAB — TB SKIN TEST
Induration: 0 mm
TB Skin Test: NEGATIVE

## 2019-02-23 ENCOUNTER — Ambulatory Visit: Payer: Self-pay | Admitting: Surgery

## 2019-04-13 ENCOUNTER — Encounter: Payer: Self-pay | Admitting: *Deleted

## 2019-05-08 ENCOUNTER — Emergency Department
Admission: EM | Admit: 2019-05-08 | Discharge: 2019-05-08 | Disposition: A | Discharge status: Home / Self Care | Payer: Medicaid Other | Attending: Emergency Medicine | Admitting: Emergency Medicine

## 2019-05-08 ENCOUNTER — Other Ambulatory Visit: Payer: Self-pay

## 2019-05-08 DIAGNOSIS — Z5321 Procedure and treatment not carried out due to patient leaving prior to being seen by health care provider: Secondary | ICD-10-CM | POA: Insufficient documentation

## 2019-05-08 DIAGNOSIS — R109 Unspecified abdominal pain: Secondary | ICD-10-CM | POA: Diagnosis present

## 2019-05-08 DIAGNOSIS — K469 Unspecified abdominal hernia without obstruction or gangrene: Secondary | ICD-10-CM | POA: Insufficient documentation

## 2019-05-08 LAB — URINALYSIS, COMPLETE (UACMP) WITH MICROSCOPIC
Bacteria, UA: NONE SEEN
Bilirubin Urine: NEGATIVE
Glucose, UA: NEGATIVE mg/dL
Hgb urine dipstick: NEGATIVE
Ketones, ur: NEGATIVE mg/dL
Leukocytes,Ua: NEGATIVE
Nitrite: NEGATIVE
Protein, ur: NEGATIVE mg/dL
Specific Gravity, Urine: 1.023 (ref 1.005–1.030)
pH: 5 (ref 5.0–8.0)

## 2019-05-08 LAB — COMPREHENSIVE METABOLIC PANEL
ALT: 31 U/L (ref 0–44)
AST: 31 U/L (ref 15–41)
Albumin: 3.7 g/dL (ref 3.5–5.0)
Alkaline Phosphatase: 76 U/L (ref 38–126)
Anion gap: 9 (ref 5–15)
BUN: 15 mg/dL (ref 6–20)
CO2: 24 mmol/L (ref 22–32)
Calcium: 8.7 mg/dL — ABNORMAL LOW (ref 8.9–10.3)
Chloride: 106 mmol/L (ref 98–111)
Creatinine, Ser: 0.88 mg/dL (ref 0.44–1.00)
GFR calc Af Amer: >60 mL/min (ref >60)
GFR calc non Af Amer: >60 mL/min (ref >60)
Glucose, Bld: 98 mg/dL (ref 70–99)
Potassium: 3.6 mmol/L (ref 3.5–5.1)
Sodium: 139 mmol/L (ref 135–145)
Total Bilirubin: 0.5 mg/dL (ref 0.3–1.2)
Total Protein: 7 g/dL (ref 6.5–8.1)

## 2019-05-08 LAB — CBC
HCT: 35.7 % — ABNORMAL LOW (ref 36.0–46.0)
Hemoglobin: 11.4 g/dL — ABNORMAL LOW (ref 12.0–15.0)
MCH: 26 pg (ref 26.0–34.0)
MCHC: 31.9 g/dL (ref 30.0–36.0)
MCV: 81.3 fL (ref 80.0–100.0)
Platelets: 314 10*3/uL (ref 150–400)
RBC: 4.39 MIL/uL (ref 3.87–5.11)
RDW: 16.1 % — ABNORMAL HIGH (ref 11.5–15.5)
WBC: 12.5 10*3/uL — ABNORMAL HIGH (ref 4.0–10.5)
nRBC: 0 % (ref 0.0–0.2)

## 2019-05-08 LAB — LIPASE, BLOOD: Lipase: 28 U/L (ref 11–51)

## 2019-05-08 LAB — POCT PREGNANCY, URINE: Preg Test, Ur: NEGATIVE

## 2019-05-08 NOTE — ED Triage Notes (Signed)
Pt states hernia x few years. States abd pain at "bottom of hernia" began Monday. 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 A&O, ambulatory. Some nausea. Denies diarrhea, vomiting, fever.

## 2019-05-08 NOTE — ED Notes (Signed)
Called for room x 1.  

## 2019-05-08 NOTE — ED Notes (Signed)
Called for room x 2.  

## 2019-05-29 ENCOUNTER — Other Ambulatory Visit: Payer: Self-pay

## 2019-05-29 ENCOUNTER — Encounter: Payer: Self-pay | Admitting: Intensive Care

## 2019-05-29 ENCOUNTER — Emergency Department
Admission: EM | Admit: 2019-05-29 | Discharge: 2019-05-29 | Disposition: A | Discharge status: Home / Self Care | Payer: Medicaid Other | Attending: Student in an Organized Health Care Education/Training Program | Admitting: Student in an Organized Health Care Education/Training Program

## 2019-05-29 DIAGNOSIS — R110 Nausea: Secondary | ICD-10-CM | POA: Insufficient documentation

## 2019-05-29 DIAGNOSIS — Z20828 Contact with and (suspected) exposure to other viral communicable diseases: Secondary | ICD-10-CM | POA: Insufficient documentation

## 2019-05-29 DIAGNOSIS — R197 Diarrhea, unspecified: Secondary | ICD-10-CM | POA: Diagnosis present

## 2019-05-29 DIAGNOSIS — I10 Essential (primary) hypertension: Secondary | ICD-10-CM | POA: Insufficient documentation

## 2019-05-29 MED ORDER — DICYCLOMINE HCL 20 MG PO TABS: 20.0000 mg | ORAL_TABLET | Freq: Three times a day (TID) | ORAL | 0 refills | Status: DC | PRN

## 2019-05-29 NOTE — Discharge Instructions (Signed)
Please stay quarantined at home until COVID-19 results return. Return with worsening chest pain, chest tightness or shortness of breath. Please take Bentyl as needed for diarrhea.

## 2019-05-29 NOTE — ED Triage Notes (Signed)
Mom c/o diarrhea with nausea that started this AM. Denies cough, fever

## 2019-05-29 NOTE — ED Provider Notes (Signed)
[facility] Emergency Department Provider Note  ____________________________________________  Time seen: Approximately 6:36 PM  I have reviewed the triage vital signs and the nursing notes.   HISTORY  Chief Complaint Diarrhea and Nausea    HPI Donna Navarro is a 33 y.o. female with complaint of nonbloody diarrhea that started today.  Patient's daughter has had similar diarrhea for the past week.  No associated rhinorrhea, nasal congestion or nonproductive cough.  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  She denies chest pain, chest tightness or shortness of breath.  No abdominal pain.  No other alleviating measures attempted.        Past Medical History:  Diagnosis Date  . GERD (gastroesophageal reflux disease)    with pregnancy-no meds  . Hypertension    lost weight and pcp took pt off bp meds 7 months  . Incompetent cervix   . Irregular periods   . Morbid obesity ([place]) 04/30/2017  . Obesity affecting pregnancy in second trimester 08/09/2018  . Syphilis affecting pregnancy   . Ventral hernia without obstruction or gangrene 04/30/2017    Patient Active Problem List   Diagnosis Date Noted  . Normal vaginal delivery 12/15/2018  . Postpartum care following vaginal delivery 12/15/2018  . Incompetent cervix   . Pregnancy 12/12/2018  . Indication for care in labor and delivery, antepartum 12/12/2018  . Decreased fetal movement 12/12/2018  . Diarrhea 12/04/2018  . Pregnancy with abdominal cramping of lower quadrant, antepartum 12/03/2018  . Sciatica 11/02/2018  . 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 Obesity affecting pregnancy, antepartum, third trimester 08/09/2018  . Morbid obesity with BMI of 40.0-44.9, adult ([place]) 08/09/2018  . Cervical cerclage suture present, antepartum 06/28/2018  . High-risk pregnancy supervision, unspecified trimester 05/17/2018  . Cervical incompetence 05/17/2018   . Hx of cerclage, currently pregnant 05/17/2018  . 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 Ventral hernia without obstruction or gangrene 04/30/2017  . Morbid obesity ([place]) 04/30/2017    Past Surgical History:  Procedure Laterality Date  . CERVICAL CERCLAGE N/A 06/17/2018   Procedure: CERCLAGE CERVICAL;  Surgeon: Homero Fellers, MD;  Location: [facility] ORS;  Service: Gynecology;  Laterality: N/A;  . CHOLECYSTECTOMY      Prior to Admission medications   Medication Sig Start Date End Date Taking? Authorizing Provider  acetaminophen (TYLENOL) 325 MG tablet Take 650 mg by mouth every 6 (six) hours as needed.    [provider]  dicyclomine (BENTYL) 20 MG tablet Take 1 tablet (20 mg total) by mouth 3 (three) times daily as needed for up to 5 days for spasms. 05/29/19 06/03/19  Lannie Fields, PA-C  ibuprofen (ADVIL) 600 MG tablet Take 1 tablet (600 mg total) by mouth every 6 (six) hours as needed for cramping. Patient not taking: Reported on 01/31/2019 12/17/18   Rod Can, CNM    Allergies Patient has no known allergies.  Family History  Problem Relation Age of Onset  . Heart disease Mother        Massive Heart Attack    Social History Social History   Tobacco Use  . Smoking status: Never Smoker  . Smokeless tobacco: Never Used  Substance Use Topics  . Alcohol use: Yes    Frequency: Never  . Drug use: Never     Review of Systems  Constitutional: No fever/chills Eyes: No visual changes. No discharge ENT: No upper respiratory complaints. Cardiovascular: no chest pain. Respiratory: no cough. No SOB.  Gastrointestinal: Patient has diarrhea.  Genitourinary: Negative for dysuria. No hematuria Musculoskeletal: Negative for musculoskeletal pain. Skin: Negative for rash, abrasions, lacerations, ecchymosis. Neurological: Negative for headaches, focal weakness or numbness.   ____________________________________________   PHYSICAL EXAM:  VITAL SIGNS: ED Triage  Vitals  Enc Vitals Group     BP 05/29/19 1628 (!) 173/101     Pulse Rate 05/29/19 1628 85     Resp 05/29/19 1628 18     Temp 05/29/19 1628 99.1 F (37.3 C)     Temp Source 05/29/19 1628 Oral     SpO2 05/29/19 1628 97 %     Weight 05/29/19 1630 300 lb (136.1 kg)     Height 05/29/19 1630 5\' 7"  (1.702 m)     Head Circumference --      Peak Flow --      Pain Score 05/29/19 1630 0     Pain Loc --      Pain Edu? --      Excl. in [place]? --      Constitutional: Alert and oriented. Well appearing and in no acute distress. Eyes: Conjunctivae are normal. PERRL. EOMI. Head: Atraumatic. ENT:      Ears: TMs are pearly.       Nose: No congestion/rhinnorhea.      Mouth/Throat: Mucous membranes are moist.  Neck: No stridor.  No cervical spine tenderness to palpation. Cardiovascular: Normal rate, regular rhythm. Normal S1 and S2.  Good peripheral circulation. Respiratory: Normal respiratory effort without tachypnea or retractions. Lungs CTAB. Good air entry to the bases with no decreased or absent breath sounds. Gastrointestinal: Bowel sounds 4 quadrants. Soft and nontender to palpation. No guarding or rigidity. No palpable masses. No distention. No CVA tenderness. Musculoskeletal: Full range of motion to all extremities. No gross deformities appreciated. Neurologic:  Normal speech and language. No gross focal neurologic deficits are appreciated.  Skin:  Skin is warm, dry and intact. No rash noted. Psychiatric: Mood and affect are normal. Speech and behavior are normal. Patient exhibits appropriate insight and judgement.   ____________________________________________   LABS (all labs ordered are listed, but only abnormal results are displayed)  Labs Reviewed  SARS CORONAVIRUS 2 (TAT 6-24 HRS)   ____________________________________________  EKG   ____________________________________________  RADIOLOGY   No results  found.  ____________________________________________    PROCEDURES  Procedure(s) performed:    Procedures    Medications - No data to display   ____________________________________________   INITIAL IMPRESSION / ASSESSMENT AND PLAN / ED COURSE  Pertinent labs & imaging results that were available during my care of the patient were reviewed by me and considered in my medical decision making (see chart for details).  Review of the [place] CSRS was performed in accordance of the [facility] prior to dispensing any controlled drugs.           Assessment and Plan:  Diarrhea:  33 year old female presents to the emergency department with nonbloody diarrhea that started today.  No fever.  At triage, patient was hypertensive but vital signs were otherwise reassuring.  Abdomen was soft and nontender.  Patient was tested for COVID-19 per her request.  Patient was advised to stay quarantined until COVID-19 results return.  A work note was provided.  All patient questions were answered.    ____________________________________________  FINAL CLINICAL IMPRESSION(S) / ED DIAGNOSES  Final diagnoses:  Diarrhea, unspecified type      NEW MEDICATIONS STARTED DURING THIS VISIT:  ED Discharge Orders  Ordered    dicyclomine (BENTYL) 20 MG tablet  3 times daily PRN     05/29/19 1820              This chart was dictated using voice recognition software/Dragon. Despite best efforts to proofread, errors can occur which can change the meaning. Any change was purely unintentional.    Orvil Feil, PA-C 05/29/19 1840    Willy Eddy, MD 05/29/19 2014

## 2019-05-30 LAB — SARS CORONAVIRUS 2 (TAT 6-24 HRS): SARS Coronavirus 2: NEGATIVE

## 2019-06-11 ENCOUNTER — Emergency Department: Payer: Medicaid Other

## 2019-06-11 ENCOUNTER — Emergency Department
Admission: EM | Admit: 2019-06-11 | Discharge: 2019-06-11 | Disposition: A | Discharge status: Home / Self Care | Payer: Medicaid Other | Attending: Emergency Medicine | Admitting: Emergency Medicine

## 2019-06-11 ENCOUNTER — Other Ambulatory Visit: Payer: Self-pay

## 2019-06-11 ENCOUNTER — Encounter: Payer: Self-pay | Admitting: Emergency Medicine

## 2019-06-11 DIAGNOSIS — Y9389 Activity, other specified: Secondary | ICD-10-CM | POA: Insufficient documentation

## 2019-06-11 DIAGNOSIS — M549 Dorsalgia, unspecified: Secondary | ICD-10-CM

## 2019-06-11 DIAGNOSIS — Z79899 Other long term (current) drug therapy: Secondary | ICD-10-CM | POA: Diagnosis not present

## 2019-06-11 DIAGNOSIS — Y929 Unspecified place or not applicable: Secondary | ICD-10-CM | POA: Diagnosis not present

## 2019-06-11 DIAGNOSIS — S39012A Strain of muscle, fascia and tendon of lower back, initial encounter: Secondary | ICD-10-CM | POA: Diagnosis not present

## 2019-06-11 DIAGNOSIS — Y998 Other external cause status: Secondary | ICD-10-CM | POA: Insufficient documentation

## 2019-06-11 DIAGNOSIS — S3982XA Other specified injuries of lower back, initial encounter: Secondary | ICD-10-CM | POA: Diagnosis present

## 2019-06-11 DIAGNOSIS — I10 Essential (primary) hypertension: Secondary | ICD-10-CM | POA: Diagnosis not present

## 2019-06-11 DIAGNOSIS — R1030 Lower abdominal pain, unspecified: Secondary | ICD-10-CM | POA: Diagnosis not present

## 2019-06-11 LAB — CBC WITH DIFFERENTIAL/PLATELET
Abs Immature Granulocytes: 0.1 10*3/uL — ABNORMAL HIGH (ref 0.00–0.07)
Basophils Absolute: 0 10*3/uL (ref 0.0–0.1)
Basophils Relative: 0 %
Eosinophils Absolute: 0.1 10*3/uL (ref 0.0–0.5)
Eosinophils Relative: 0 %
HCT: 38.5 % (ref 36.0–46.0)
Hemoglobin: 12.2 g/dL (ref 12.0–15.0)
Immature Granulocytes: 1 %
Lymphocytes Relative: 24 %
Lymphs Abs: 3.5 10*3/uL (ref 0.7–4.0)
MCH: 25.9 pg — ABNORMAL LOW (ref 26.0–34.0)
MCHC: 31.7 g/dL (ref 30.0–36.0)
MCV: 81.7 fL (ref 80.0–100.0)
Monocytes Absolute: 0.7 10*3/uL (ref 0.1–1.0)
Monocytes Relative: 5 %
Neutro Abs: 10.1 10*3/uL — ABNORMAL HIGH (ref 1.7–7.7)
Neutrophils Relative %: 70 %
Platelets: 318 10*3/uL (ref 150–400)
RBC: 4.71 MIL/uL (ref 3.87–5.11)
RDW: 16.3 % — ABNORMAL HIGH (ref 11.5–15.5)
WBC: 14.4 10*3/uL — ABNORMAL HIGH (ref 4.0–10.5)
nRBC: 0 % (ref 0.0–0.2)

## 2019-06-11 LAB — COMPREHENSIVE METABOLIC PANEL
ALT: 18 U/L (ref 0–44)
AST: 17 U/L (ref 15–41)
Albumin: 4 g/dL (ref 3.5–5.0)
Alkaline Phosphatase: 80 U/L (ref 38–126)
Anion gap: 10 (ref 5–15)
BUN: 13 mg/dL (ref 6–20)
CO2: 23 mmol/L (ref 22–32)
Calcium: 8.7 mg/dL — ABNORMAL LOW (ref 8.9–10.3)
Chloride: 106 mmol/L (ref 98–111)
Creatinine, Ser: 0.78 mg/dL (ref 0.44–1.00)
GFR calc Af Amer: >60 mL/min (ref >60)
GFR calc non Af Amer: >60 mL/min (ref >60)
Glucose, Bld: 115 mg/dL — ABNORMAL HIGH (ref 70–99)
Potassium: 3.9 mmol/L (ref 3.5–5.1)
Sodium: 139 mmol/L (ref 135–145)
Total Bilirubin: 0.6 mg/dL (ref 0.3–1.2)
Total Protein: 7.6 g/dL (ref 6.5–8.1)

## 2019-06-11 LAB — URINALYSIS, COMPLETE (UACMP) WITH MICROSCOPIC
Bilirubin Urine: NEGATIVE
Glucose, UA: NEGATIVE mg/dL
Hgb urine dipstick: NEGATIVE
Ketones, ur: NEGATIVE mg/dL
Nitrite: NEGATIVE
Protein, ur: NEGATIVE mg/dL
Specific Gravity, Urine: 1.019 (ref 1.005–1.030)
pH: 6 (ref 5.0–8.0)

## 2019-06-11 LAB — POCT PREGNANCY, URINE: Preg Test, Ur: NEGATIVE

## 2019-06-11 MED ORDER — CEFDINIR 300 MG PO CAPS: 300.0000 mg | ORAL_CAPSULE | Freq: Two times a day (BID) | ORAL | 0 refills | Status: DC

## 2019-06-11 MED ORDER — MELOXICAM 15 MG PO TABS: 15.0000 mg | ORAL_TABLET | Freq: Every day | ORAL | 2 refills | Status: DC

## 2019-06-11 MED ORDER — METHOCARBAMOL 500 MG PO TABS: 500.0000 mg | ORAL_TABLET | Freq: Four times a day (QID) | ORAL | 0 refills | Status: DC

## 2019-06-11 MED ORDER — IOHEXOL 300 MG/ML  SOLN
100.0000 mL | Freq: Once | INTRAMUSCULAR | Status: AC | PRN
  Administered 2019-06-11: 100 mL via INTRAVENOUS
  Filled 2019-06-11: qty 100

## 2019-06-11 NOTE — ED Notes (Signed)
Agree with triage assessment. Pt had to go to the bathroom so given urine cup for sample in case needed.
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
## 2019-06-11 NOTE — Discharge Instructions (Addendum)
Follow-up with your regular doctor about your hernia.  Follow-up with orthopedics if your back is not better in 1 week.  Take your medication as needed for musculoskeletal type pain.  Return if worsening.
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
## 2019-06-11 NOTE — ED Notes (Signed)
Patient transported to CT 

## 2019-06-24 NOTE — L&D Delivery Note (Signed)
Delivery Note  First Stage: Labor onset: 1232 Augmentation : Pitocin and AROM Analgesia /Anesthesia intrapartum: epidural AROM at 1232, large amt clear fluid.   Second Stage: Complete dilation at 1553 Onset of pushing at 1600 FHR second stage variable decels to 55-60bpm  Delivery of a viable female infant on 04/06/20 at 1610 by CNM delivery of fetal head in LOA position with restitution to LOT. Loose nuchal cord x 1;  Anterior then posterior shoulders delivered easily with gentle downward traction. Cord additionally wrapped once around infant right arm and once around right leg.  Baby placed on mom's chest, and attended to by peds.  Cord double clamped after cessation of pulsation, cut by FOB   Third Stage: Placenta delivered spontaneoudly intact with 3VC @ 1615 Placenta disposition: routine disposal Uterine tone Firm / bleeding scant  Right labial abrasion and left periurethral abrasions noted. No cervical or vaginal lacerations identified  Anesthesia for repair: n/a Repair n/a, hemostatic.  Est. Blood Loss (mL): 100  Complications: none  Mom to postpartum.  Baby to Couplet care / Skin to Skin.  Newborn: Birth Weight: pending  Apgar Scores: 9/9 Feeding planned: formula

## 2019-08-03 ENCOUNTER — Emergency Department
Admission: EM | Admit: 2019-08-03 | Discharge: 2019-08-03 | Disposition: A | Discharge status: Home / Self Care | Payer: Medicaid Other | Attending: Student | Admitting: Student

## 2019-08-03 ENCOUNTER — Encounter: Payer: Self-pay | Admitting: Emergency Medicine

## 2019-08-03 ENCOUNTER — Other Ambulatory Visit: Payer: Self-pay

## 2019-08-03 DIAGNOSIS — I10 Essential (primary) hypertension: Secondary | ICD-10-CM | POA: Diagnosis not present

## 2019-08-03 DIAGNOSIS — Z20822 Contact with and (suspected) exposure to covid-19: Secondary | ICD-10-CM | POA: Diagnosis not present

## 2019-08-03 DIAGNOSIS — M791 Myalgia, unspecified site: Secondary | ICD-10-CM | POA: Diagnosis present

## 2019-08-03 DIAGNOSIS — R52 Pain, unspecified: Secondary | ICD-10-CM

## 2019-08-03 LAB — COMPREHENSIVE METABOLIC PANEL
ALT: 33 U/L (ref 0–44)
AST: 25 U/L (ref 15–41)
Albumin: 3.6 g/dL (ref 3.5–5.0)
Alkaline Phosphatase: 78 U/L (ref 38–126)
Anion gap: 10 (ref 5–15)
BUN: 16 mg/dL (ref 6–20)
CO2: 23 mmol/L (ref 22–32)
Calcium: 8.8 mg/dL — ABNORMAL LOW (ref 8.9–10.3)
Chloride: 106 mmol/L (ref 98–111)
Creatinine, Ser: 0.67 mg/dL (ref 0.44–1.00)
GFR calc Af Amer: >60 mL/min (ref >60)
GFR calc non Af Amer: >60 mL/min (ref >60)
Glucose, Bld: 131 mg/dL — ABNORMAL HIGH (ref 70–99)
Potassium: 3.8 mmol/L (ref 3.5–5.1)
Sodium: 139 mmol/L (ref 135–145)
Total Bilirubin: 0.6 mg/dL (ref 0.3–1.2)
Total Protein: 7 g/dL (ref 6.5–8.1)

## 2019-08-03 LAB — CBC WITH DIFFERENTIAL/PLATELET
Abs Immature Granulocytes: 0.05 10*3/uL (ref 0.00–0.07)
Basophils Absolute: 0.1 10*3/uL (ref 0.0–0.1)
Basophils Relative: 0 %
Eosinophils Absolute: 0.1 10*3/uL (ref 0.0–0.5)
Eosinophils Relative: 1 %
HCT: 38.3 % (ref 36.0–46.0)
Hemoglobin: 12.5 g/dL (ref 12.0–15.0)
Immature Granulocytes: 0 %
Lymphocytes Relative: 27 %
Lymphs Abs: 3.7 10*3/uL (ref 0.7–4.0)
MCH: 26.9 pg (ref 26.0–34.0)
MCHC: 32.6 g/dL (ref 30.0–36.0)
MCV: 82.5 fL (ref 80.0–100.0)
Monocytes Absolute: 0.6 10*3/uL (ref 0.1–1.0)
Monocytes Relative: 5 %
Neutro Abs: 9.1 10*3/uL — ABNORMAL HIGH (ref 1.7–7.7)
Neutrophils Relative %: 67 %
Platelets: 327 10*3/uL (ref 150–400)
RBC: 4.64 MIL/uL (ref 3.87–5.11)
RDW: 16.1 % — ABNORMAL HIGH (ref 11.5–15.5)
WBC: 13.6 10*3/uL — ABNORMAL HIGH (ref 4.0–10.5)
nRBC: 0 % (ref 0.0–0.2)

## 2019-08-03 LAB — TSH: TSH: 0.798 u[IU]/mL (ref 0.350–4.500)

## 2019-08-03 LAB — URINALYSIS, COMPLETE (UACMP) WITH MICROSCOPIC
Bilirubin Urine: NEGATIVE
Glucose, UA: NEGATIVE mg/dL
Hgb urine dipstick: NEGATIVE
Ketones, ur: NEGATIVE mg/dL
Nitrite: NEGATIVE
Protein, ur: NEGATIVE mg/dL
Specific Gravity, Urine: 1.02 (ref 1.005–1.030)
pH: 7 (ref 5.0–8.0)

## 2019-08-03 LAB — VITAMIN B12: Vitamin B-12: 286 pg/mL (ref 180–914)

## 2019-08-03 NOTE — ED Provider Notes (Signed)
[facility] Emergency Department Provider Note  ____________________________________________   First MD Initiated Contact with Patient 08/03/19 1455     (approximate)  I have reviewed the triage vital signs and the nursing notes.   HISTORY  Chief Complaint Generalized Body Aches and Tingling    HPI Donna Navarro is a 34 y.o. female presents emergency department complaining of generalized body aches and tingling in both eyes and her right shoulder.  No Covid-like symptoms.  Patient states he just feels like it had the flu and its the aftermath.  Retains her sense of taste and smell.  Denies any cough or congestion.  No vomiting or diarrhea.  No new medications.  Remainder review systems is negative    Past Medical History:  Diagnosis Date  . GERD (gastroesophageal reflux disease)    with pregnancy-no meds  . Hypertension    lost weight and pcp took pt off bp meds 7 months  . Incompetent cervix   . Irregular periods   . Morbid obesity (HCC) 04/30/2017  . Obesity affecting pregnancy in second trimester 08/09/2018  . Syphilis affecting pregnancy   . Ventral hernia without obstruction or gangrene 04/30/2017    Patient Active Problem List   Diagnosis Date Noted  . Normal vaginal delivery 12/15/2018  . Postpartum care following vaginal delivery 12/15/2018  . Incompetent cervix   . Pregnancy 12/12/2018  . Indication for care in labor and delivery, antepartum 12/12/2018  . Decreased fetal movement 12/12/2018  . Diarrhea 12/04/2018  . Pregnancy with abdominal cramping of lower quadrant, antepartum 12/03/2018  . Sciatica 11/02/2018  . 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 Obesity affecting pregnancy, antepartum, third trimester 08/09/2018  . Morbid obesity with BMI of 40.0-44.9, adult (HCC) 08/09/2018  . Cervical cerclage suture present, antepartum 06/28/2018  . High-risk pregnancy supervision, unspecified trimester 05/17/2018   . Cervical incompetence 05/17/2018  . Hx of cerclage, currently pregnant 05/17/2018  . 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 Ventral hernia without obstruction or gangrene 04/30/2017  . Morbid obesity (HCC) 04/30/2017    Past Surgical History:  Procedure Laterality Date  . CERVICAL CERCLAGE N/A 06/17/2018   Procedure: CERCLAGE CERVICAL;  Surgeon: Natale Milch, MD;  Location: [facility] ORS;  Service: Gynecology;  Laterality: N/A;  . CHOLECYSTECTOMY      Prior to Admission medications   Medication Sig Start Date End Date Taking? Authorizing Provider  acetaminophen (TYLENOL) 325 MG tablet Take 650 mg by mouth every 6 (six) hours as needed.    [provider]  cefdinir (OMNICEF) 300 MG capsule Take 1 capsule (300 mg total) by mouth 2 (two) times daily. 06/11/19   Magdalene Tardiff, Roselyn Bering, PA-C  dicyclomine (BENTYL) 20 MG tablet Take 1 tablet (20 mg total) by mouth 3 (three) times daily as needed for up to 5 days for spasms. 05/29/19 06/03/19  Orvil Feil, PA-C  ibuprofen (ADVIL) 600 MG tablet Take 1 tablet (600 mg total) by mouth every 6 (six) hours as needed for cramping. Patient not taking: Reported on 01/31/2019 12/17/18   Tresea Mall, CNM  meloxicam (MOBIC) 15 MG tablet Take 1 tablet (15 mg total) by mouth daily. 06/11/19 06/10/20  Calea Hribar, Roselyn Bering, PA-C  methocarbamol (ROBAXIN) 500 MG tablet Take 1 tablet (500 mg total) by mouth 4 (four) times daily. 06/11/19   Faythe Ghee, PA-C    Allergies Patient has no known allergies.  Family History  Problem Relation Age of Onset  . Heart disease Mother  Massive Heart Attack    Social History Social History   Tobacco Use  . Smoking status: Never Smoker  . Smokeless tobacco: Never Used  Substance Use Topics  . Alcohol use: Yes  . Drug use: Never    Review of Systems  Constitutional: No fever/chills Eyes: No visual changes. ENT: No sore throat. Respiratory: Denies cough Cardiovascular: Denies chest pain  Gastrointestinal: Denies abdominal pain Genitourinary: Negative for dysuria. Musculoskeletal: Negative for back pain.  Positive for body aches and tingling in the right leg Skin: Negative for rash. Psychiatric: no mood changes,     ____________________________________________   PHYSICAL EXAM:  VITAL SIGNS: ED Triage Vitals  Enc Vitals Group     BP 08/03/19 1440 (!) 151/88     Pulse Rate 08/03/19 1440 92     Resp 08/03/19 1440 18     Temp 08/03/19 1440 98.6 F (37 C)     Temp Source 08/03/19 1440 Oral     SpO2 08/03/19 1440 100 %     Weight 08/03/19 1435 (!) 311 lb (141.1 kg)     Height 08/03/19 1435 5\' 7"  (1.702 m)     Head Circumference --      Peak Flow --      Pain Score 08/03/19 1434 6     Pain Loc --      Pain Edu? --      Excl. in [place]? --     Constitutional: Alert and oriented. Well appearing and in no acute distress. Eyes: Conjunctivae are normal.  Head: Atraumatic. Nose: No congestion/rhinnorhea. Mouth/Throat: Mucous membranes are moist.   Neck:  supple no lymphadenopathy noted Cardiovascular: Normal rate, regular rhythm. Heart sounds are normal Respiratory: Normal respiratory effort.  No retractions, lungs c t a  Abd: soft nontender bs normal all 4 quad GU: deferred Musculoskeletal: FROM all extremities, warm and well perfused Neurologic:  Normal speech and language.  Skin:  Skin is warm, dry and intact. No rash noted. Psychiatric: Mood and affect are normal. Speech and behavior are normal.  ____________________________________________   LABS (all labs ordered are listed, but only abnormal results are displayed)  Labs Reviewed  CBC WITH DIFFERENTIAL/PLATELET - Abnormal; Notable for the following components:      Result Value   WBC 13.6 (*)    RDW 16.1 (*)    Neutro Abs 9.1 (*)    All other components within normal limits  COMPREHENSIVE METABOLIC PANEL - Abnormal; Notable for the following components:   Glucose, Bld 131 (*)    Calcium 8.8 (*)     All other components within normal limits  URINALYSIS, COMPLETE (UACMP) WITH MICROSCOPIC - Abnormal; Notable for the following components:   Color, Urine YELLOW (*)    APPearance CLOUDY (*)    Leukocytes,Ua TRACE (*)    All other components within normal limits  SARS CORONAVIRUS 2 (TAT 6-24 HRS)  TSH  VITAMIN B12   ____________________________________________   ____________________________________________  RADIOLOGY    ____________________________________________   PROCEDURES  Procedure(s) performed: No  Procedures    ____________________________________________   INITIAL IMPRESSION / ASSESSMENT AND PLAN / ED COURSE  Pertinent labs & imaging results that were available during my care of the patient were reviewed by me and considered in my medical decision making (see chart for details).   Patient is 34 year old female presents emergency department with complaints of body aches and tingling.   Physical exam patient appears well.  Exam is basically unremarkable  Due to the vague complaints we  will do basic labs.  CBC has elevated WBC of 13. Metabolic panel, TSH level, UA, are all normal.  Explained the findings to the patient.  Due to the elevated WBC of 2 of the Covid swab as she does work in [employer].  If she is worsening she is to return emergency department.  She is to take Tylenol or ibuprofen as needed.  Follow-up with her regular doctor as needed.  She states she understands and was discharged in stable condition.    Donna Navarro was evaluated in Emergency Department on 08/03/2019 for the symptoms described in the history of present illness. She was evaluated in the context of the global COVID-19 pandemic, which necessitated consideration that the patient might be at risk for infection with the SARS-CoV-2 virus that causes COVID-19. Institutional protocols and algorithms that pertain to the evaluation of patients at risk for COVID-19 are in a state of  rapid change based on information released by regulatory bodies including the CDC and federal and state organizations. These policies and algorithms were followed during the patient's care in the ED.   As part of my medical decision making, I reviewed the following data within the [place] notes reviewed and incorporated, Labs reviewed see above, Old chart reviewed, Notes from prior ED visits and [place] Controlled Substance Database  ____________________________________________   FINAL CLINICAL IMPRESSION(S) / ED DIAGNOSES  Final diagnoses:  Body aches      NEW MEDICATIONS STARTED DURING THIS VISIT:  New Prescriptions   No medications on file     Note:  This document was prepared using Dragon voice recognition software and may include unintentional dictation errors.    Versie Starks, PA-C 08/03/19 1616    Merlyn Lot, MD 08/03/19 [phone]

## 2019-08-03 NOTE — Discharge Instructions (Signed)
Follow with your regular doctor if not improving in 3 days.  Return emergency department worsening.  You develop fever, chills, cough or congestion please return emergency department.  Your WBC was elevated today.  However the rest of your labs are normal.  Covid test should return in 6 to 24 hours.  If negative you may return to your daily activities.  If positive quarantine for an additional 10 to 14 days.

## 2019-08-03 NOTE — ED Triage Notes (Signed)
Pt reports general bodyaches like she has the flu but no other sx's of the flu and intermittent tingling in her bilateral thighs and shoulders for 2 days. Denies all other sx's.

## 2019-08-03 NOTE — ED Notes (Addendum)
PT c/o generalized body aches and tingling in bilateral thighs and right shoulder. PT denies CP, cough, fever, abnormal SOB, N/V/D. Denies any contact with covid.

## 2019-08-04 LAB — SARS CORONAVIRUS 2 (TAT 6-24 HRS): SARS Coronavirus 2: NEGATIVE

## 2019-08-21 ENCOUNTER — Emergency Department: Payer: Medicaid Other

## 2019-08-21 ENCOUNTER — Encounter: Payer: Self-pay | Admitting: Emergency Medicine

## 2019-08-21 ENCOUNTER — Other Ambulatory Visit: Payer: Self-pay

## 2019-08-21 ENCOUNTER — Emergency Department
Admission: EM | Admit: 2019-08-21 | Discharge: 2019-08-21 | Disposition: A | Discharge status: Home / Self Care | Payer: Medicaid Other | Attending: Emergency Medicine | Admitting: Emergency Medicine

## 2019-08-21 DIAGNOSIS — I10 Essential (primary) hypertension: Secondary | ICD-10-CM | POA: Insufficient documentation

## 2019-08-21 DIAGNOSIS — R1030 Lower abdominal pain, unspecified: Secondary | ICD-10-CM | POA: Insufficient documentation

## 2019-08-21 DIAGNOSIS — O26899 Other specified pregnancy related conditions, unspecified trimester: Secondary | ICD-10-CM | POA: Diagnosis not present

## 2019-08-21 DIAGNOSIS — Z3A00 Weeks of gestation of pregnancy not specified: Secondary | ICD-10-CM | POA: Insufficient documentation

## 2019-08-21 DIAGNOSIS — R109 Unspecified abdominal pain: Secondary | ICD-10-CM

## 2019-08-21 LAB — COMPREHENSIVE METABOLIC PANEL
ALT: 21 U/L (ref 0–44)
AST: 17 U/L (ref 15–41)
Albumin: 3.8 g/dL (ref 3.5–5.0)
Alkaline Phosphatase: 62 U/L (ref 38–126)
Anion gap: 8 (ref 5–15)
BUN: 11 mg/dL (ref 6–20)
CO2: 25 mmol/L (ref 22–32)
Calcium: 8.8 mg/dL — ABNORMAL LOW (ref 8.9–10.3)
Chloride: 106 mmol/L (ref 98–111)
Creatinine, Ser: 0.76 mg/dL (ref 0.44–1.00)
GFR calc Af Amer: >60 mL/min (ref >60)
GFR calc non Af Amer: >60 mL/min (ref >60)
Glucose, Bld: 112 mg/dL — ABNORMAL HIGH (ref 70–99)
Potassium: 3.3 mmol/L — ABNORMAL LOW (ref 3.5–5.1)
Sodium: 139 mmol/L (ref 135–145)
Total Bilirubin: 0.7 mg/dL (ref 0.3–1.2)
Total Protein: 7.1 g/dL (ref 6.5–8.1)

## 2019-08-21 LAB — URINALYSIS, COMPLETE (UACMP) WITH MICROSCOPIC
Bacteria, UA: NONE SEEN
Bilirubin Urine: NEGATIVE
Glucose, UA: NEGATIVE mg/dL
Hgb urine dipstick: NEGATIVE
Ketones, ur: NEGATIVE mg/dL
Leukocytes,Ua: NEGATIVE
Nitrite: NEGATIVE
Protein, ur: NEGATIVE mg/dL
Specific Gravity, Urine: 1.023 (ref 1.005–1.030)
pH: 5 (ref 5.0–8.0)

## 2019-08-21 LAB — CBC
HCT: 35.7 % — ABNORMAL LOW (ref 36.0–46.0)
Hemoglobin: 11.5 g/dL — ABNORMAL LOW (ref 12.0–15.0)
MCH: 26.8 pg (ref 26.0–34.0)
MCHC: 32.2 g/dL (ref 30.0–36.0)
MCV: 83.2 fL (ref 80.0–100.0)
Platelets: 328 10*3/uL (ref 150–400)
RBC: 4.29 MIL/uL (ref 3.87–5.11)
RDW: 16.1 % — ABNORMAL HIGH (ref 11.5–15.5)
WBC: 14.7 10*3/uL — ABNORMAL HIGH (ref 4.0–10.5)
nRBC: 0 % (ref 0.0–0.2)

## 2019-08-21 LAB — POCT PREGNANCY, URINE: Preg Test, Ur: POSITIVE — AB

## 2019-08-21 LAB — HCG, QUANTITATIVE, PREGNANCY: hCG, Beta Chain, Quant, S: 4958 m[IU]/mL — ABNORMAL HIGH (ref <5)

## 2019-08-21 LAB — LIPASE, BLOOD: Lipase: 24 U/L (ref 11–51)

## 2019-08-21 MED ORDER — SODIUM CHLORIDE 0.9% FLUSH: 3.0000 mL | Freq: Once | INTRAVENOUS | Status: DC

## 2019-08-21 NOTE — ED Triage Notes (Signed)
PT to ER with c/o abdominal pain x 2 days described as cramping.  Pt states nausea, no vomiting and diarrhea that started today.

## 2019-08-21 NOTE — ED Provider Notes (Addendum)
[facility] Emergency Department Provider Note ____________________________________________   First MD Initiated Contact with Patient 08/21/19 1300     (approximate)  I have reviewed the triage vital signs and the nursing notes.   HISTORY  Chief Complaint Abdominal Pain    HPI Donna Navarro is a 34 y.o. female with PMH as noted below who presents with bilateral lower abdominal pain, described as crampy in nature, and associated with some diarrhea today.  The patient has also had some nausea but denies vomiting or fever.  She denies any vaginal bleeding or discharge, and has had no dysuria or frequency.  Her last menstrual period was around January 22.  Past Medical History:  Diagnosis Date  . GERD (gastroesophageal reflux disease)    with pregnancy-no meds  . Hypertension    lost weight and pcp took pt off bp meds 7 months  . Incompetent cervix   . Irregular periods   . Morbid obesity (HCC) 04/30/2017  . Obesity affecting pregnancy in second trimester 08/09/2018  . Syphilis affecting pregnancy   . Ventral hernia without obstruction or gangrene 04/30/2017    Patient Active Problem List   Diagnosis Date Noted  . Normal vaginal delivery 12/15/2018  . Postpartum care following vaginal delivery 12/15/2018  . Incompetent cervix   . Pregnancy 12/12/2018  . Indication for care in labor and delivery, antepartum 12/12/2018  . Decreased fetal movement 12/12/2018  . Diarrhea 12/04/2018  . Pregnancy with abdominal cramping of lower quadrant, antepartum 12/03/2018  . Sciatica 11/02/2018  . 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 Obesity affecting pregnancy, antepartum, third trimester 08/09/2018  . Morbid obesity with BMI of 40.0-44.9, adult (HCC) 08/09/2018  . Cervical cerclage suture present, antepartum 06/28/2018  . High-risk pregnancy supervision, unspecified trimester 05/17/2018  . Cervical incompetence 05/17/2018  . Hx of  cerclage, currently pregnant 05/17/2018  . 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 Ventral hernia without obstruction or gangrene 04/30/2017  . Morbid obesity (HCC) 04/30/2017    Past Surgical History:  Procedure Laterality Date  . CERVICAL CERCLAGE N/A 06/17/2018   Procedure: CERCLAGE CERVICAL;  Surgeon: Natale Milch, MD;  Location: [facility] ORS;  Service: Gynecology;  Laterality: N/A;  . CHOLECYSTECTOMY      Prior to Admission medications   Medication Sig Start Date End Date Taking? Authorizing Provider  acetaminophen (TYLENOL) 325 MG tablet Take 650 mg by mouth every 6 (six) hours as needed.    [provider]  cefdinir (OMNICEF) 300 MG capsule Take 1 capsule (300 mg total) by mouth 2 (two) times daily. 06/11/19   Fisher, Roselyn Bering, PA-C  dicyclomine (BENTYL) 20 MG tablet Take 1 tablet (20 mg total) by mouth 3 (three) times daily as needed for up to 5 days for spasms. 05/29/19 06/03/19  Orvil Feil, PA-C  ibuprofen (ADVIL) 600 MG tablet Take 1 tablet (600 mg total) by mouth every 6 (six) hours as needed for cramping. Patient not taking: Reported on 01/31/2019 12/17/18   Tresea Mall, CNM  meloxicam (MOBIC) 15 MG tablet Take 1 tablet (15 mg total) by mouth daily. 06/11/19 06/10/20  Fisher, Roselyn Bering, PA-C  methocarbamol (ROBAXIN) 500 MG tablet Take 1 tablet (500 mg total) by mouth 4 (four) times daily. 06/11/19   Faythe Ghee, PA-C    Allergies Patient has no known allergies.  Family History  Problem Relation Age of Onset  . Heart disease Mother        Massive Heart Attack  Social History Social History   Tobacco Use  . Smoking status: Never Smoker  . Smokeless tobacco: Never Used  Substance Use Topics  . Alcohol use: Yes  . Drug use: Never    Review of Systems  Constitutional: No fever. Eyes: No redness. ENT: No sore throat. Cardiovascular: Denies chest pain. Respiratory: Denies shortness of breath. Gastrointestinal: No vomiting.  Positive for diarrhea.    Genitourinary: Negative for dysuria or vaginal bleeding.  Musculoskeletal: Negative for back pain. Skin: Negative for rash. Neurological: Negative for headache.   ____________________________________________   PHYSICAL EXAM:  VITAL SIGNS: ED Triage Vitals  Enc Vitals Group     BP 08/21/19 1247 (!) 154/103     Pulse Rate 08/21/19 1247 90     Resp 08/21/19 1247 16     Temp 08/21/19 1247 99.8 F (37.7 C)     Temp Source 08/21/19 1247 Oral     SpO2 08/21/19 1247 100 %     Weight 08/21/19 1236 (!) 310 lb (140.6 kg)     Height 08/21/19 1236 5\' 7"  (1.702 m)     Head Circumference --      Peak Flow --      Pain Score 08/21/19 1236 4     Pain Loc --      Pain Edu? --      Excl. in [place]? --     Constitutional: Alert and oriented. Well appearing and in no acute distress. Eyes: Conjunctivae are normal.  Head: Atraumatic. Nose: No congestion/rhinnorhea. Mouth/Throat: Mucous membranes are moist.   Neck: Normal range of motion.  Cardiovascular: Normal rate, regular rhythm.  Good peripheral circulation. Respiratory: Normal respiratory effort.  No retractions. Gastrointestinal: Soft with mild bilateral lower abdominal discomfort but no focal tenderness.  Large ventral hernia which is nontender and easily reducible.  No distention.  Genitourinary: No flank tenderness. Musculoskeletal:  Extremities warm and well perfused.  Neurologic:  Normal speech and language. No gross focal neurologic deficits are appreciated.  Skin:  Skin is warm and dry. No rash noted. Psychiatric: Mood and affect are normal. Speech and behavior are normal.  ____________________________________________   LABS (all labs ordered are listed, but only abnormal results are displayed)  Labs Reviewed  COMPREHENSIVE METABOLIC PANEL - Abnormal; Notable for the following components:      Result Value   Potassium 3.3 (*)    Glucose, Bld 112 (*)    Calcium 8.8 (*)    All other components within normal limits  CBC -  Abnormal; Notable for the following components:   WBC 14.7 (*)    Hemoglobin 11.5 (*)    HCT 35.7 (*)    RDW 16.1 (*)    All other components within normal limits  URINALYSIS, COMPLETE (UACMP) WITH MICROSCOPIC - Abnormal; Notable for the following components:   Color, Urine YELLOW (*)    APPearance HAZY (*)    All other components within normal limits  HCG, QUANTITATIVE, PREGNANCY - Abnormal; Notable for the following components:   hCG, Beta Chain, Quant, S 4,958 (*)    All other components within normal limits  POCT PREGNANCY, URINE - Abnormal; Notable for the following components:   Preg Test, Ur POSITIVE (*)    All other components within normal limits  LIPASE, BLOOD  POC URINE PREG, ED   ____________________________________________  EKG   ____________________________________________  RADIOLOGY  US OB transvaginal: Pending  ____________________________________________   PROCEDURES  Procedure(s) performed: No  Procedures  Critical Care performed: No ____________________________________________  INITIAL IMPRESSION / ASSESSMENT AND PLAN / ED COURSE  Pertinent labs & imaging results that were available during my care of the patient were reviewed by me and considered in my medical decision making (see chart for details).  35 year old female with PMH as noted above presents with lower abdominal crampy pain over the last several days associated with some diarrhea today.  The patient has not had any vomiting, fever, urinary symptoms, or any vaginal bleeding or discharge.  On exam she is overall well-appearing and her vital signs are normal except for slightly elevated BP.  The abdomen is soft with mild lower abdominal discomfort bilaterally but no focal tenderness, and she has a large ventral hernia which is reproducible and nontender.  The urine pregnancy is positive.  Overall I suspect most likely abdominal pain related to early pregnancy although differential also  includes ectopic or miscarriage.  Given the crampy pain, diarrhea, and minimal tenderness, the primary nongynecologic etiology is gastroenteritis or foodborne illness.  ----------------------------------------- 2:35 PM on 08/21/2019 -----------------------------------------  The lab work-up is unremarkable.  The hCG is 4900.  The patient states that she needs to leave because she is concerned that her 26-month-old child is not doing well with the person who she is currently having babysit.  The patient continues to appear comfortable and has not required any pain medication here.  At this time my clinical suspicion for ectopic pregnancy or other acute complication is very low.  I discussed with the patient that without an ultrasound we cannot confirm an intrauterine pregnancy or fully rule out ectopic or other acute complication which could be dangerous or even life-threatening.  I recommended that she return to the emergency department soon as possible to resume the work-up and get the ultrasound.  I also emphasized that she should return at any time if she has any new or worsening symptoms.  I advised that if her pain completely resolves or she is unable to return to the ER for any reason she should make an appointment with her OB/GYN at [facility] as soon as possible.  The patient was able to paraphrase the above risks back to me and demonstrates understanding of her condition and appropriate decision-making capacity.  ____________________________________________   FINAL CLINICAL IMPRESSION(S) / ED DIAGNOSES  Final diagnoses:  Abdominal pain during pregnancy, antepartum      NEW MEDICATIONS STARTED DURING THIS VISIT:  New Prescriptions   No medications on file     Note:  This document was prepared using Dragon voice recognition software and may include unintentional dictation errors.       Dionne Bucy, MD 08/21/19 1438

## 2019-08-21 NOTE — ED Notes (Signed)
Pt called out stating that she was requesting to leave d/t needing to pick up her child from babysitter. Informed EDP.

## 2019-08-21 NOTE — Discharge Instructions (Addendum)
Since we were not able to obtain an ultrasound before you needed to leave, we cannot be sure if the pain is due to a normal early pregnancy, or potentially an ectopic pregnancy or other problem.  If your pain resolves, you should call [facility] tomorrow to schedule a follow-up appointment.  If your pain continues or gets worse, or if you have any new or worsening symptoms, please return to the emergency department immediately so we can resume your work-up.

## 2019-09-07 DIAGNOSIS — O09291 Supervision of pregnancy with other poor reproductive or obstetric history, first trimester: Secondary | ICD-10-CM | POA: Insufficient documentation

## 2019-09-08 ENCOUNTER — Encounter: Payer: Medicaid Other | Admitting: Obstetrics and Gynecology

## 2019-09-30 DIAGNOSIS — O0990 Supervision of high risk pregnancy, unspecified, unspecified trimester: Secondary | ICD-10-CM | POA: Insufficient documentation

## 2019-09-30 LAB — OB RESULTS CONSOLE HEPATITIS B SURFACE ANTIGEN: Hepatitis B Surface Ag: NEGATIVE

## 2019-09-30 LAB — OB RESULTS CONSOLE RUBELLA ANTIBODY, IGM: Rubella: IMMUNE

## 2019-09-30 LAB — OB RESULTS CONSOLE VARICELLA ZOSTER ANTIBODY, IGG: Varicella: IMMUNE

## 2019-10-06 ENCOUNTER — Other Ambulatory Visit: Payer: Self-pay | Admitting: Obstetrics and Gynecology

## 2019-10-17 ENCOUNTER — Encounter: Payer: Self-pay | Admitting: *Deleted

## 2019-10-17 ENCOUNTER — Encounter
Admission: RE | Admit: 2019-10-17 | Discharge: 2019-10-17 | Disposition: A | Discharge status: Home / Self Care | Payer: Medicaid Other | Source: Ambulatory Visit | Attending: Obstetrics and Gynecology | Admitting: Obstetrics and Gynecology

## 2019-10-17 ENCOUNTER — Other Ambulatory Visit: Payer: Self-pay

## 2019-10-17 HISTORY — DX: Headache, unspecified: R51.9

## 2019-10-17 HISTORY — DX: Personal history of other diseases of the digestive system: Z87.19

## 2019-10-17 NOTE — Patient Instructions (Addendum)
Your procedure is scheduled on: 10-24-19 MONDAY Report to Same Day Surgery 2nd floor medical mall [facility] Entrance-take elevator on left to 2nd floor.  Check in with surgery information desk.) To find out your arrival time please call [phone] between 1PM - 3PM on 10-21-19 FRIDAY  Remember: Instructions that are not followed completely may result in serious medical risk, up to and including death, or upon the discretion of your surgeon and anesthesiologist your surgery may need to be rescheduled.    _x___ 1. Do not eat food after midnight the night before your procedure. NO GUM OR CANDY AFTER MIDNIGHT. You may drink clear liquids up to 2 hours before you are scheduled to arrive at the hospital for your procedure.  Do not drink clear liquids within 2 hours of your scheduled arrival to the hospital.  Clear liquids include  --Water or Apple juice without pulp  --Gatorade  --Black Coffee or Clear Tea (No milk, no creamers, do not add anything to the coffee or Tea   ____Ensure clear carbohydrate drink on the way to the hospital for bariatric patients  _X___Ensure clear carbohydrate drink-FINISH DRINK 2 HOURS PRIOR TO ARRIVAL TIME TO HOSPITAL     __x__ 2. No Alcohol for 24 hours before or after surgery.   __x__3. No Smoking or e-cigarettes for 24 prior to surgery.  Do not use any chewable tobacco products for at least 6 hour prior to surgery   ____  4. Bring all medications with you on the day of surgery if instructed.    __x__ 5. Notify your doctor if there is any change in your medical condition     (cold, fever, infections).    x___6. On the morning of surgery brush your teeth with toothpaste and water.  You may rinse your mouth with mouth wash if you wish.  Do not swallow any toothpaste or mouthwash.   Do not wear jewelry, make-up, hairpins, clips or nail polish.  Do not wear lotions, powders, or perfumes. You may wear deodorant.  Do not shave 48 hours prior to surgery. Men  may shave face and neck.  Do not bring valuables to the hospital.    [facility] is not responsible for any belongings or valuables.               Contacts, dentures or bridgework may not be worn into surgery.  Leave your suitcase in the car. After surgery it may be brought to your room.  For patients admitted to the hospital, discharge time is determined by your  treatment team.  _  Patients discharged the day of surgery will not be allowed to drive home.  You will need someone to drive you home and stay with you the night of your procedure.    Please read over the following fact sheets that you were given:   [facility] Preparing for Surgery/INCENTIVE SPIROMETER INSTRUCTIONS-YOU WILL RECEIVE INCENTIVE SPIROMETER DAY OF SURGERY  ____ Take anti-hypertensive listed below, cardiac, seizure, asthma, anti-reflux and psychiatric medicines. These include:  1. NONE  2.  3.  4.  5.  6.  ____Fleets enema or Magnesium Citrate as directed.   ____ Use CHG Soap or sage wipes as directed on instruction sheet   ____ Use inhalers on the day of surgery and bring to hospital day of surgery  ____ Stop Metformin and Janumet 2 days prior to surgery.    ____ Take 1/2 of usual insulin dose the night before surgery and none on  the morning surgery.   ____ Follow recommendations from Cardiologist, Pulmonologist or PCP regarding stopping Aspirin, Coumadin, Plavix ,Eliquis, Effient, or Pradaxa, and Pletal.  X____Stop Anti-inflammatories such as Advil, Aleve, Ibuprofen, Motrin, Naproxen, Naprosyn, Goodies powders or aspirin products now-OK to take Tylenol    ____ Stop supplements until after surgery.     ____ Bring C-Pap to the hospital.

## 2019-10-19 NOTE — H&P (Signed)
Donna Navarro is a 34 y.o. female here for Rf Victorino Dike Oxley,CNM-discuss possible cerclage .pt is at14+3  On 10/24/2019 weeks based on sure LMP 07/15/19 and confirmatory u/s on 09/07/19 with a history of incompetent cervix with first pregnancy  PTD at 20 weeks  Subsequent to this she had a mcdonald cerclage placed 2006+2011+2020 and 3 SVD between 38-[redacted] weeks gestation . Last pregnancy with induction at 39 weeks .      Past Medical History:  has no past medical history on file.  Past Surgical History:  has a past surgical history that includes Laparoscopic cholecystectomy and Cervical Cerclage Vaginal (06/17/2018). Family History: family history includes No Known Problems in her father and mother. Social History:  reports that she has never smoked. She has never used smokeless tobacco. She reports previous alcohol use. She reports that she does not use drugs. OB/GYN History:          OB History    Gravida  7   Para  3   Term  3   Preterm      AB  3   Living  3     SAB  2   TAB      Ectopic      Molar      Multiple      Live Births  3          Allergies: has No Known Allergies. Medications:  Current Outpatient Medications:  .  prenatal vit-iron fum-folic ac (PRENAVITE) tablet, Take 1 tablet by mouth once daily, Disp: , Rfl:   Review of Systems: General:                      No fatigue or weight loss Eyes:                           No vision changes Ears:                            No hearing difficulty Respiratory:                No cough or shortness of breath Pulmonary:                  No asthma or shortness of breath Cardiovascular:           No chest pain, palpitations, dyspnea on exertion Gastrointestinal:          No abdominal bloating, chronic diarrhea, constipations, masses, pain or hematochezia Genitourinary:             No hematuria, dysuria, abnormal vaginal discharge, pelvic pain, Menometrorrhagia Lymphatic:                   No swollen lymph  nodes Musculoskeletal:         No muscle weakness Neurologic:                  No extremity weakness, syncope, seizure disorder Psychiatric:                  No history of depression, delusions or suicidal/homicidal ideation    Exam:      Vitals:   10/19/19 0930  BP: 125/83  Pulse: 101    Body mass index is 49.18 kg/m.  WDWN black female in NAD   Lungs: CTA  CV : RRR without murmur  Neck:  no thyromegaly Abdomen: soft , no mass, normal active bowel sounds,  non-tender, no rebound tenderness. Large supraumbilical ventral hernia 10x10 cm  Pelvic: tanner stage 5 ,  External genitalia: vulva /labia no lesions Urethra: no prolapse Vagina: normal physiologic d/c Cervix: no lesions, no cervical motion tenderness   Uterus: normal size shape and contour, non-tender Adnexa: no mass,  non-tender   Rectovaginal:  Impression:   The primary encounter diagnosis was Morbid obesity with BMI of 45.0-49.9, adult (CMS-HCC). A diagnosis of History of incompetent cervix, currently pregnant, first trimester was also pertinent to this visit.    Plan:  Pt is scheduled for a McDonald cerclage placement . Risks of ROM and fetal loss discussed . General precautions discussed including blood transfusion  , organ injury . All questions answered     Caroline Sauger, MD        Electronically signed by Caroline Sauger, MD on 09/21/2019 6:36 PM

## 2019-10-20 ENCOUNTER — Other Ambulatory Visit
Admission: RE | Admit: 2019-10-20 | Discharge: 2019-10-20 | Disposition: A | Discharge status: Home / Self Care | Payer: Medicaid Other | Source: Ambulatory Visit | Attending: Obstetrics and Gynecology | Admitting: Obstetrics and Gynecology

## 2019-10-20 DIAGNOSIS — Z01812 Encounter for preprocedural laboratory examination: Secondary | ICD-10-CM | POA: Insufficient documentation

## 2019-10-20 DIAGNOSIS — Z20822 Contact with and (suspected) exposure to covid-19: Secondary | ICD-10-CM | POA: Diagnosis not present

## 2019-10-20 LAB — SARS CORONAVIRUS 2 (TAT 6-24 HRS): SARS Coronavirus 2: NEGATIVE

## 2019-10-21 ENCOUNTER — Other Ambulatory Visit
Admission: RE | Admit: 2019-10-21 | Discharge: 2019-10-21 | Disposition: A | Discharge status: Home / Self Care | Payer: BC Managed Care – PPO | Source: Ambulatory Visit | Attending: Obstetrics and Gynecology | Admitting: Obstetrics and Gynecology

## 2019-10-21 DIAGNOSIS — Z01812 Encounter for preprocedural laboratory examination: Secondary | ICD-10-CM | POA: Insufficient documentation

## 2019-10-21 LAB — TYPE AND SCREEN
ABO/RH(D): A POS
Antibody Screen: NEGATIVE
Extend sample reason: UNDETERMINED

## 2019-10-21 LAB — CBC
HCT: 33.6 % — ABNORMAL LOW (ref 36.0–46.0)
Hemoglobin: 11.2 g/dL — ABNORMAL LOW (ref 12.0–15.0)
MCH: 27.9 pg (ref 26.0–34.0)
MCHC: 33.3 g/dL (ref 30.0–36.0)
MCV: 83.8 fL (ref 80.0–100.0)
Platelets: 294 10*3/uL (ref 150–400)
RBC: 4.01 MIL/uL (ref 3.87–5.11)
RDW: 15.5 % (ref 11.5–15.5)
WBC: 16.6 10*3/uL — ABNORMAL HIGH (ref 4.0–10.5)
nRBC: 0 % (ref 0.0–0.2)

## 2019-10-21 LAB — BASIC METABOLIC PANEL
Anion gap: 5 (ref 5–15)
BUN: 8 mg/dL (ref 6–20)
CO2: 25 mmol/L (ref 22–32)
Calcium: 8.8 mg/dL — ABNORMAL LOW (ref 8.9–10.3)
Chloride: 105 mmol/L (ref 98–111)
Creatinine, Ser: 0.58 mg/dL (ref 0.44–1.00)
GFR calc Af Amer: >60 mL/min (ref >60)
GFR calc non Af Amer: >60 mL/min (ref >60)
Glucose, Bld: 101 mg/dL — ABNORMAL HIGH (ref 70–99)
Potassium: 3.7 mmol/L (ref 3.5–5.1)
Sodium: 135 mmol/L (ref 135–145)

## 2019-10-24 ENCOUNTER — Ambulatory Visit
Admission: RE | Admit: 2019-10-24 | Discharge: 2019-10-24 | Disposition: A | Discharge status: Home / Self Care | Payer: Medicaid Other | Source: Ambulatory Visit | Attending: Obstetrics and Gynecology | Admitting: Obstetrics and Gynecology

## 2019-10-24 ENCOUNTER — Ambulatory Visit: Payer: Medicaid Other | Admitting: Certified Registered Nurse Anesthetist

## 2019-10-24 ENCOUNTER — Encounter: Payer: Self-pay | Admitting: Obstetrics and Gynecology

## 2019-10-24 ENCOUNTER — Other Ambulatory Visit: Payer: Self-pay

## 2019-10-24 ENCOUNTER — Encounter
Admission: RE | Disposition: A | Discharge status: Home / Self Care | Payer: Self-pay | Source: Ambulatory Visit | Attending: Obstetrics and Gynecology

## 2019-10-24 DIAGNOSIS — K219 Gastro-esophageal reflux disease without esophagitis: Secondary | ICD-10-CM | POA: Insufficient documentation

## 2019-10-24 DIAGNOSIS — O99212 Obesity complicating pregnancy, second trimester: Secondary | ICD-10-CM | POA: Diagnosis not present

## 2019-10-24 DIAGNOSIS — Z3A20 20 weeks gestation of pregnancy: Secondary | ICD-10-CM | POA: Diagnosis not present

## 2019-10-24 DIAGNOSIS — O3432 Maternal care for cervical incompetence, second trimester: Secondary | ICD-10-CM | POA: Diagnosis present

## 2019-10-24 DIAGNOSIS — O99612 Diseases of the digestive system complicating pregnancy, second trimester: Secondary | ICD-10-CM | POA: Insufficient documentation

## 2019-10-24 HISTORY — PX: CERVICAL CERCLAGE: SHX1329

## 2019-10-24 SURGERY — CERCLAGE, CERVIX, VAGINAL APPROACH
Anesthesia: Spinal

## 2019-10-24 MED ORDER — BUPIVACAINE IN DEXTROSE 0.75-8.25 % IT SOLN
INTRATHECAL | Status: DC | PRN
  Administered 2019-10-24: 1.2 mL via INTRATHECAL

## 2019-10-24 MED ORDER — ACETAMINOPHEN 500 MG PO TABS
1000.0000 mg | ORAL_TABLET | ORAL | Status: AC
  Administered 2019-10-24: 1000 mg via ORAL

## 2019-10-24 MED ORDER — OXYCODONE HCL 5 MG/5ML PO SOLN: 5.0000 mg | Freq: Once | ORAL | Status: DC | PRN

## 2019-10-24 MED ORDER — LACTATED RINGERS IV SOLN: INTRAVENOUS | Status: DC

## 2019-10-24 MED ORDER — OXYCODONE HCL 5 MG PO TABS: 5.0000 mg | ORAL_TABLET | Freq: Once | ORAL | Status: DC | PRN

## 2019-10-24 MED ORDER — FENTANYL CITRATE (PF) 100 MCG/2ML IJ SOLN: 25.0000 ug | INTRAMUSCULAR | Status: DC | PRN

## 2019-10-24 MED ORDER — GABAPENTIN 300 MG PO CAPS
300.0000 mg | ORAL_CAPSULE | ORAL | Status: AC
  Administered 2019-10-24: 300 mg via ORAL

## 2019-10-24 MED ORDER — ONDANSETRON HCL 4 MG/2ML IJ SOLN: 4.0000 mg | Freq: Once | INTRAMUSCULAR | Status: DC | PRN

## 2019-10-24 MED ORDER — DEXTROSE 5 % IV SOLN
3.0000 g | Freq: Once | INTRAVENOUS | Status: AC
  Administered 2019-10-24: 3 g via INTRAVENOUS
  Filled 2019-10-24: qty 3

## 2019-10-24 MED ORDER — FAMOTIDINE 20 MG PO TABS
20.0000 mg | ORAL_TABLET | Freq: Once | ORAL | Status: AC
  Administered 2019-10-24: 20 mg via ORAL

## 2019-10-24 SURGICAL SUPPLY — 20 items
CANISTER SUCT 1200ML W/VALVE (MISCELLANEOUS) ×3 IMPLANT
CATH ROBINSON RED A/P 16FR (CATHETERS) ×3 IMPLANT
COVER WAND RF STERILE (DRAPES) ×3 IMPLANT
DRAPE UNDER BUTTOCK W/FLU (DRAPES) ×3 IMPLANT
ELECT REM PT RETURN 9FT ADLT (ELECTROSURGICAL) ×3
ELECTRODE REM PT RTRN 9FT ADLT (ELECTROSURGICAL) ×1 IMPLANT
GLOVE SURG SYN 8.0 (GLOVE) ×3 IMPLANT
GLOVE SURG SYN 8.0 PF PI (GLOVE) ×1 IMPLANT
GOWN STRL REUS W/ TWL LRG LVL3 (GOWN DISPOSABLE) ×1 IMPLANT
GOWN STRL REUS W/ TWL XL LVL3 (GOWN DISPOSABLE) ×1 IMPLANT
GOWN STRL REUS W/TWL LRG LVL3 (GOWN DISPOSABLE) ×3
GOWN STRL REUS W/TWL XL LVL3 (GOWN DISPOSABLE) ×6
KIT TURNOVER CYSTO (KITS) ×3 IMPLANT
LABEL OR SOLS (LABEL) ×3 IMPLANT
NS IRRIG 500ML POUR BTL (IV SOLUTION) ×3 IMPLANT
PACK BASIN MINOR (MISCELLANEOUS) ×3 IMPLANT
PAD OB MATERNITY 4.3X12.25 (PERSONAL CARE ITEMS) ×3 IMPLANT
PAD PREP 24X41 OB/GYN DISP (PERSONAL CARE ITEMS) ×3 IMPLANT
SURGILUBE 2OZ TUBE FLIPTOP (MISCELLANEOUS) ×3 IMPLANT
TAPE MERSILENE 5MM 36 OS-8 WHT (SUTURE) ×3 IMPLANT

## 2019-10-24 NOTE — Op Note (Signed)
NAME: Donna Navarro, Donna Navarro MEDICAL RECORD [id number] ACCOUNT [id number] DATE OF BIRTH:[birth year] FACILITY: [facility] LOCATION: [facility] PHYSICIAN:Markese Bloxham Cloyde Reams, MD  OPERATIVE REPORT  DATE OF PROCEDURE:  10/24/2019  PREOPERATIVE DIAGNOSES:   1.  14+3 weeks estimated gestational age. 2.  History of incompetent cervix.  POSTOPERATIVE DIAGNOSES:   1.  14+3 weeks estimated gestational age. 2.  History of incompetent cervix.  PROCEDURE:  McDonald cervical cerclage.  ANESTHESIA:  Spinal.  SURGEON:  Suzy Bouchard, MD  FIRST ASSISTANT:  PA student Penelope Coop, MD  INDICATIONS:  This is a 34 year old gravida 7, para 3 patient who is at 14+3 weeks estimated gestational age who has a history of an incompetent cervix.  The patient lost a fetus at 20 weeks with her 1st pregnancy and she had 3 subsequent McDonald  cervical cerclages.  DESCRIPTION OF PROCEDURE:  After adequate spinal anesthesia, the patient was placed in dorsal supine position with the legs in the [place] stirrups.  The patient's abdomen, perineum and vagina were prepped and draped in normal sterile fashion.  Timeout was  performed.  The patient did receive 3 grams IV Ancef prior to commencement of the case.  Bladder was drained with a red Robinson catheter, yielding 200 mL of urine.  Weighted speculum was placed in the posterior vaginal vault and Sims retractor was used  to identify the cervix.  Ring forceps was placed on the cervix at 12 o'clock and a 5 mm Mersilene band was then placed at 12 o'clock, exiting at 2 o'clock, entering at 4 o'clock, exiting at 5 o'clock, entering at 7 o'clock, exiting at 8 o'clock, and  entering at 10 o'clock, and exiting at 12 o'clock.  The band was then cinched tight and knot was placed at 12 o'clock.  Good hemostasis was noted.  COMPLICATIONS:  There were no complications.  DISPOSITION:  The patient tolerated the procedure well.  INTRAOPERATIVE FLUIDS:  200 mL.  ESTIMATED BLOOD  LOSS:  Minimal.  URINE OUTPUT:  200 mL.    Fetal heart rate at the end of the case documented at 150.  CN/NUANCE  D:10/24/2019 T:10/24/2019 JOB:[id number]

## 2019-10-24 NOTE — Discharge Instructions (Addendum)

## 2019-10-24 NOTE — Anesthesia Postprocedure Evaluation (Signed)
Anesthesia Post Note  Patient: Donna Navarro  Procedure(s) Performed: CERCLAGE CERVICAL - MCDONALD - MERSILENE BAND (N/A )  Patient location during evaluation: PACU Anesthesia Type: Spinal Level of consciousness: oriented and awake and alert Pain management: pain level controlled Vital Signs Assessment: post-procedure vital signs reviewed and stable Respiratory status: spontaneous breathing, respiratory function stable and patient connected to nasal cannula oxygen Cardiovascular status: blood pressure returned to baseline and stable Postop Assessment: no headache, no backache and no apparent nausea or vomiting Anesthetic complications: no     Last Vitals:  Vitals:   10/24/19 1048 10/24/19 1101  BP: 102/69 (!) 103/42  Pulse: 62 75  Resp: 20 18  Temp: 36.8 C (!) 35.9 C  SpO2: 100% 100%    Last Pain:  Vitals:   10/24/19 1101  TempSrc: Temporal  PainSc: 0-No pain                 Corinda Gubler

## 2019-10-24 NOTE — Progress Notes (Signed)
Pts fetal heart rate 150 per Dr Feliberto Gottron

## 2019-10-24 NOTE — Transfer of Care (Signed)
Immediate Anesthesia Transfer of Care Note  Patient: Donna Navarro  Procedure(s) Performed: CERCLAGE CERVICAL - MCDONALD - MERSILENE BAND (N/A )  Patient Location: PACU  Anesthesia Type:Spinal  Level of Consciousness: awake, alert  and oriented  Airway & Oxygen Therapy: Patient Spontanous Breathing  Post-op Assessment: Report given to RN and Post -op Vital signs reviewed and stable  Post vital signs: Reviewed and stable  Last Vitals:  Vitals Value Taken Time  BP    Temp 36.7 C 10/24/19 1022  Pulse    Resp    SpO2      Last Pain:  Vitals:   10/24/19 0851  TempSrc: Tympanic  PainSc: 0-No pain         Complications: No apparent anesthesia complications

## 2019-10-24 NOTE — Brief Op Note (Signed)
10/24/2019  10:26 AM  PATIENT:  Donna Navarro  34 y.o. female  PRE-OPERATIVE DIAGNOSIS:  incompetent cervix  POST-OPERATIVE DIAGNOSIS:  incompetent cervix  PROCEDURE:  Procedure(s): CERCLAGE CERVICAL - MCDONALD - MERSILENE BAND (N/A)  SURGEON:  Surgeon(s) and Role:    * Urbano Milhouse, Ihor Austin, MD - Primary  PHYSICIAN ASSISTANT: PA student J. Neff  ASSISTANTS: none   ANESTHESIA:   spinal  EBL:  0 mL  IOF 200 cc  UO 200  BLOOD ADMINISTERED:none  DRAINS: none   LOCAL MEDICATIONS USED:  NONE  SPECIMEN:  No Specimen  DISPOSITION OF SPECIMEN:  N/A  COUNTS:  YES  TOURNIQUET:  * No tourniquets in log *  DICTATION: .Other Dictation: Dictation Number verbal  PLAN OF CARE: Discharge to home after PACU  PATIENT DISPOSITION:  PACU - hemodynamically stable.   Delay start of Pharmacological VTE agent (>24hrs) due to surgical blood loss or risk of bleeding: not applicable

## 2019-10-24 NOTE — Anesthesia Procedure Notes (Signed)
Spinal  Patient location during procedure: OR Start time: 10/24/2019 9:35 AM End time: 10/24/2019 9:40 AM Staffing Performed: resident/CRNA  Anesthesiologist: Corinda Gubler, MD Resident/CRNA: Elmarie Mainland, CRNA Preanesthetic Checklist Completed: patient identified, IV checked, site marked, risks and benefits discussed, surgical consent, monitors and equipment checked, pre-op evaluation and timeout performed Spinal Block Patient position: sitting Prep: ChloraPrep Patient monitoring: continuous pulse ox and blood pressure Approach: midline Location: L3-4 Needle Needle type: Pencan  Needle gauge: 24 G Assessment Sensory level: T10 Additional Notes Negative heme, negative paresthesia.

## 2019-10-24 NOTE — Progress Notes (Signed)
Pt is 14+3 weeks   scheduled for cervical cerclage  Labs reviewed . All questions answered  Proceed

## 2019-10-24 NOTE — Consult Note (Addendum)
[facility] Anesthesia Consultation  IVEY NEMBHARD CBJ:[id number] DOB: 10/25/1985 DOA: 10/24/2019 PCP: [facility]   Requesting physician: Dr. Ouida Sills Date of consultation: 10/24/19 Reason for consultation: Obesity during pregnancy  CHIEF COMPLAINT:  Obesity during pregnancy  HISTORY OF PRESENT ILLNESS: Donna Navarro  is a 34 y.o. female with a known history of obesity during pregnancy. Has had multiple prior cerclages during pregnancy, all prior vaginal deliveries. Denies hx of cardiovascular disease. Denies hx of asthma. Had elevated BP during her last pregnancy. Denies personal or family hx of bleeding disorders.   PAST MEDICAL HISTORY:   Past Medical History:  Diagnosis Date  . GERD (gastroesophageal reflux disease)    with pregnancy-no meds  . Headache    migraines during pregnancy  . History of hiatal hernia    large per pt  . Hypertension    lost weight and pcp took pt off bp meds 7 months  . Incompetent cervix   . Irregular periods   . Morbid obesity ([place]) 04/30/2017  . Obesity affecting pregnancy in second trimester 08/09/2018  . Syphilis affecting pregnancy   . Ventral hernia without obstruction or gangrene 04/30/2017    PAST SURGICAL HISTORY:  Past Surgical History:  Procedure Laterality Date  . CERVICAL CERCLAGE N/A 06/17/2018   Procedure: CERCLAGE CERVICAL;  Surgeon: Homero Fellers, MD;  Location: [facility] ORS;  Service: Gynecology;  Laterality: N/A;x 3  . CHOLECYSTECTOMY      SOCIAL HISTORY:  Social History   Tobacco Use  . Smoking status: Never Smoker  . Smokeless tobacco: Never Used  Substance Use Topics  . Alcohol use: Not Currently    FAMILY HISTORY:  Family History  Problem Relation Age of Onset  . Heart disease Mother        Massive Heart Attack    DRUG ALLERGIES: No Known Allergies  REVIEW OF SYSTEMS:   RESPIRATORY: No cough, shortness of breath, wheezing.  CARDIOVASCULAR: No chest  pain, orthopnea, edema.  HEMATOLOGY: No anemia, easy bruising or bleeding SKIN: No rash or lesion. NEUROLOGIC: No tingling, numbness, weakness.  PSYCHIATRY: No anxiety or depression.   MEDICATIONS AT HOME:  Prior to Admission medications   Medication Sig Start Date End Date Taking? Authorizing Provider  acetaminophen (TYLENOL) 325 MG tablet Take 650 mg by mouth every 6 (six) hours as needed for moderate pain.    Yes [provider]  Prenatal Vit-Fe Fumarate-FA (PRENATAL MULTIVITAMIN) TABS tablet Take 1 tablet by mouth daily at 12 noon.   Yes [provider]  calcium carbonate (TUMS - DOSED IN MG ELEMENTAL CALCIUM) 500 MG chewable tablet Chew 1 tablet by mouth as needed for indigestion or heartburn.    [provider]  cefdinir (OMNICEF) 300 MG capsule Take 1 capsule (300 mg total) by mouth 2 (two) times daily. Patient not taking: Reported on 10/07/2019 06/11/19   Versie Starks, PA-C  dicyclomine (BENTYL) 20 MG tablet Take 1 tablet (20 mg total) by mouth 3 (three) times daily as needed for up to 5 days for spasms. Patient not taking: Reported on 10/07/2019 05/29/19 06/03/19  Lannie Fields, PA-C  ibuprofen (ADVIL) 600 MG tablet Take 1 tablet (600 mg total) by mouth every 6 (six) hours as needed for cramping. Patient not taking: Reported on 01/31/2019 12/17/18   Rod Can, CNM  meloxicam (MOBIC) 15 MG tablet Take 1 tablet (15 mg total) by mouth daily. Patient not taking: Reported on 10/07/2019 06/11/19 06/10/20  Versie Starks, PA-C  methocarbamol (ROBAXIN) 500 MG tablet Take 1 tablet (500 mg total) by mouth 4 (four) times daily. Patient not taking: Reported on 10/07/2019 06/11/19   Versie Starks, PA-C      PHYSICAL EXAMINATION:   VITAL SIGNS: Blood pressure 122/68, pulse 88, temperature 36.5 C, temperature source Tympanic, resp. rate 18, height _0  (1.702 m), weight (!) 143.8 kg, last menstrual period 07/15/2019, SpO2 99 %, not currently  breastfeeding.  GENERAL:  34 y.o.-year-old patient no acute distress.  HEENT: Head atraumatic, normocephalic. Oropharynx and nasopharynx clear. MP 2, TM distance >3 cm, normal mouth opening, grade 2 upper lip bite LUNGS: Normal breath sounds bilaterally, no wheezing, rales,rhonchi. No use of accessory muscles of respiration.  CARDIOVASCULAR: S1, S2 normal. No murmurs, rubs, or gallops.  EXTREMITIES: No pedal edema, cyanosis, or clubbing.  NEUROLOGIC: normal gait PSYCHIATRIC: The patient is alert and oriented x 3.  SKIN: No obvious rash, lesion, or ulcer.    IMPRESSION AND PLAN:   Donna Navarro  is a 34 y.o. female presenting with obesity during pregnancy. BMI is currently 49 at [redacted] weeks gestation.   Airway exam reassuring on exam. Pt had general anesthesia for cerclage in 2019, grade 1 with MAC 3 for intubation at that time. Spinal interspaces minimally palpable.   We discussed analgesic options during labor including epidural analgesia. Discussed that in obesity there can be increased difficulty with epidural placement or even failure of successful epidural. We also discussed that even after successful epidural placement there is increased risk of catheter migration out of the epidural space that would require catheter replacement. Discussed use of epidural vs spinal vs GA if cesarean delivery is required. Discussed increased risk of difficult intubation during pregnancy should an emergency cesarean delivery be required.   We discussed repeat evaluation at 32-34 weeks by anesthesia to determine whether there is a high risk of complications of anesthesia for which we would recommend transfer of OB care to a facility with a higher maternal level of care designation.

## 2019-10-24 NOTE — Anesthesia Preprocedure Evaluation (Signed)
Anesthesia Evaluation  Patient identified by MRN, date of birth, ID band Patient awake    Reviewed: Allergy & Precautions, NPO status , Patient's Chart, lab work & pertinent test results, reviewed documented beta blocker date and time   History of Anesthesia Complications Negative for: history of anesthetic complications  Airway Mallampati: III  TM Distance: >3 FB Neck ROM: Full    Dental no notable dental hx. (+) Chipped, Dental Advisory Given, Teeth Intact   Pulmonary neg pulmonary ROS, neg sleep apnea, neg COPD, Patient abstained from smoking.Not current smoker,    Pulmonary exam normal breath sounds clear to auscultation       Cardiovascular Exercise Tolerance: Good METShypertension, Pt. on medications (-) CAD and (-) Past MI (-) dysrhythmias  Rhythm:Regular Rate:Normal - Systolic murmurs    Neuro/Psych  Headaches, negative psych ROS   GI/Hepatic GERD  Controlled,(+)     (-) substance abuse  ,   Endo/Other  neg diabetesMorbid obesity  Renal/GU negative Renal ROS     Musculoskeletal   Abdominal (+) + obese,   Peds  Hematology   Anesthesia Other Findings Past Medical History: No date: GERD (gastroesophageal reflux disease)     Comment:  with pregnancy-no meds No date: Headache     Comment:  migraines during pregnancy No date: History of hiatal hernia     Comment:  large per pt No date: Hypertension     Comment:  lost weight and pcp took pt off bp meds 7 months No date: Incompetent cervix No date: Irregular periods 04/30/2017: Morbid obesity (HCC) 08/09/2018: Obesity affecting pregnancy in second trimester No date: Syphilis affecting pregnancy 04/30/2017: Ventral hernia without obstruction or gangrene  Reproductive/Obstetrics                             Anesthesia Physical  Anesthesia Plan  ASA: III  Anesthesia Plan: Spinal and General   Post-op Pain Management:     Induction: Intravenous  PONV Risk Score and Plan: 4 or greater and Ondansetron, Dexamethasone, Propofol infusion and TIVA  Airway Management Planned: Natural Airway and Oral ETT  Additional Equipment:   Intra-op Plan:   Post-operative Plan:   Informed Consent: I have reviewed the patients History and Physical, chart, labs and discussed the procedure including the risks, benefits and alternatives for the proposed anesthesia with the patient or authorized representative who has indicated his/her understanding and acceptance.       Plan Discussed with: CRNA and Surgeon  Anesthesia Plan Comments: (Patient has had prior cerclages with spinal successfully but her most recent one about a year ago was particularly traumatic to patient; she says she was stuck about 6 times before the anesthetic was converted to general anesthesia. Patient agrees to abbreviated attempts at neuraxial anesthesia, and if this is unsuccessful we will convert to GA.  Discussed R/B/A of neuraxial anesthesia technique with patient: - rare risks of spinal/epidural hematoma, nerve damage, infection - Risk of PDPH - Risk of nausea and vomiting - Risk of conversion to general anesthesia and its associated risks, including sore throat, damage to lips/teeth/oropharynx, and rare risks such as cardiac and respiratory events.  Patient voiced understanding.)        Anesthesia Quick Evaluation

## 2019-11-19 ENCOUNTER — Emergency Department
Admission: EM | Admit: 2019-11-19 | Discharge: 2019-11-19 | Disposition: A | Discharge status: Home / Self Care | Payer: Medicaid Other | Attending: Emergency Medicine | Admitting: Emergency Medicine

## 2019-11-19 ENCOUNTER — Other Ambulatory Visit: Payer: Self-pay

## 2019-11-19 DIAGNOSIS — R42 Dizziness and giddiness: Secondary | ICD-10-CM | POA: Diagnosis not present

## 2019-11-19 DIAGNOSIS — R109 Unspecified abdominal pain: Secondary | ICD-10-CM | POA: Insufficient documentation

## 2019-11-19 DIAGNOSIS — O99891 Other specified diseases and conditions complicating pregnancy: Secondary | ICD-10-CM | POA: Diagnosis not present

## 2019-11-19 DIAGNOSIS — Z3A18 18 weeks gestation of pregnancy: Secondary | ICD-10-CM | POA: Diagnosis not present

## 2019-11-19 DIAGNOSIS — Z5321 Procedure and treatment not carried out due to patient leaving prior to being seen by health care provider: Secondary | ICD-10-CM | POA: Diagnosis not present

## 2019-11-19 LAB — COMPREHENSIVE METABOLIC PANEL
ALT: 10 U/L (ref 0–44)
AST: 11 U/L — ABNORMAL LOW (ref 15–41)
Albumin: 3 g/dL — ABNORMAL LOW (ref 3.5–5.0)
Alkaline Phosphatase: 63 U/L (ref 38–126)
Anion gap: 9 (ref 5–15)
BUN: 7 mg/dL (ref 6–20)
CO2: 21 mmol/L — ABNORMAL LOW (ref 22–32)
Calcium: 9.2 mg/dL (ref 8.9–10.3)
Chloride: 106 mmol/L (ref 98–111)
Creatinine, Ser: 0.66 mg/dL (ref 0.44–1.00)
GFR calc Af Amer: >60 mL/min (ref >60)
GFR calc non Af Amer: >60 mL/min (ref >60)
Glucose, Bld: 125 mg/dL — ABNORMAL HIGH (ref 70–99)
Potassium: 4 mmol/L (ref 3.5–5.1)
Sodium: 136 mmol/L (ref 135–145)
Total Bilirubin: 0.5 mg/dL (ref 0.3–1.2)
Total Protein: 6.8 g/dL (ref 6.5–8.1)

## 2019-11-19 LAB — URINALYSIS, COMPLETE (UACMP) WITH MICROSCOPIC
Bilirubin Urine: NEGATIVE
Glucose, UA: NEGATIVE mg/dL
Hgb urine dipstick: NEGATIVE
Ketones, ur: NEGATIVE mg/dL
Nitrite: NEGATIVE
Protein, ur: 30 mg/dL — AB
Specific Gravity, Urine: 1.03 (ref 1.005–1.030)
pH: 5 (ref 5.0–8.0)

## 2019-11-19 LAB — CBC
HCT: 35.8 % — ABNORMAL LOW (ref 36.0–46.0)
Hemoglobin: 11.7 g/dL — ABNORMAL LOW (ref 12.0–15.0)
MCH: 28 pg (ref 26.0–34.0)
MCHC: 32.7 g/dL (ref 30.0–36.0)
MCV: 85.6 fL (ref 80.0–100.0)
Platelets: 294 10*3/uL (ref 150–400)
RBC: 4.18 MIL/uL (ref 3.87–5.11)
RDW: 15.4 % (ref 11.5–15.5)
WBC: 19.5 10*3/uL — ABNORMAL HIGH (ref 4.0–10.5)
nRBC: 0 % (ref 0.0–0.2)

## 2019-11-19 LAB — LIPASE, BLOOD: Lipase: 22 U/L (ref 11–51)

## 2019-11-19 LAB — HCG, QUANTITATIVE, PREGNANCY: hCG, Beta Chain, Quant, S: 33870 m[IU]/mL — ABNORMAL HIGH (ref <5)

## 2019-11-19 MED ORDER — SODIUM CHLORIDE 0.9% FLUSH: 3.0000 mL | Freq: Once | INTRAVENOUS | Status: DC

## 2019-11-19 NOTE — ED Triage Notes (Signed)
Pt states she is [redacted] weeks pregnant and is having mid abd pain, pt states my hernia is hurting and has been trying to puch is back in with no relief. No noted discoloration at this time .denies any vaginal bleeding. Pt is also c/o dizziness states she was recently put on b/p meds for HTN.

## 2019-11-19 NOTE — ED Notes (Signed)
Pt called in the WR with no response 

## 2019-11-22 ENCOUNTER — Telehealth: Payer: Self-pay | Admitting: Emergency Medicine

## 2019-11-22 NOTE — Telephone Encounter (Signed)
Called patient due to lwot to inquire about condition and follow up plans. Says she is doing okay, but the hernia gets irritated.  Says she is moving bowels, eating and drinking okay.  She has appt with her doctor on Thursday.  I told her to return if worse in the mean time.

## 2019-11-25 ENCOUNTER — Other Ambulatory Visit: Payer: Self-pay

## 2019-11-25 ENCOUNTER — Encounter
Admission: RE | Admit: 2019-11-25 | Discharge: 2019-11-25 | Disposition: A | Discharge status: Home / Self Care | Payer: Medicaid Other | Source: Ambulatory Visit | Attending: Anesthesiology | Admitting: Anesthesiology

## 2019-12-06 IMAGING — CT CT ABD-PELV W/ CM
2 of 4 series · 15 of 46 positions shown, 17 images · IV contrast (iopamidol)
Comparison: 04/28/2017 CT.

CLINICAL DATA: 32-year-old female with large hernia for 7 years.
Intermittent cramping. Experiencing very intense cramps since
morning. Prior cholecystectomy. Initial encounter.

EXAM:
CT ABDOMEN AND PELVIS WITH CONTRAST
TECHNIQUE: Multidetector CT imaging of the abdomen and pelvis was performed
using the standard protocol following bolus administration of
intravenous contrast.
CONTRAST:  125mL KZLMOY-F77 IOPAMIDOL (KZLMOY-F77) INJECTION 61%

[Series 5: coronal st · coronal · 0.77mm/px · 3 of 95 slices shown]
[im 32/95  soft-tissue]
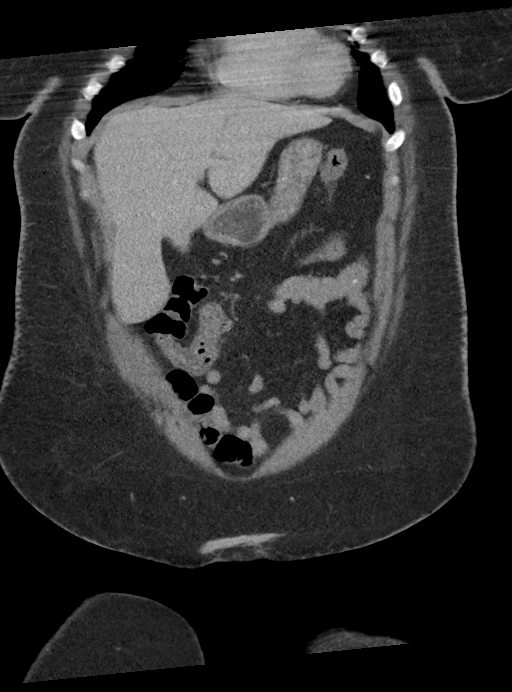
[im 42/95  soft-tissue]
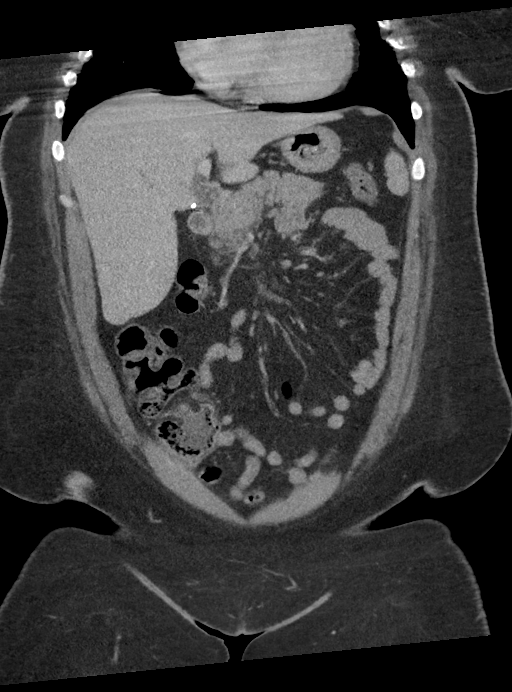
[im 53/95  soft-tissue]
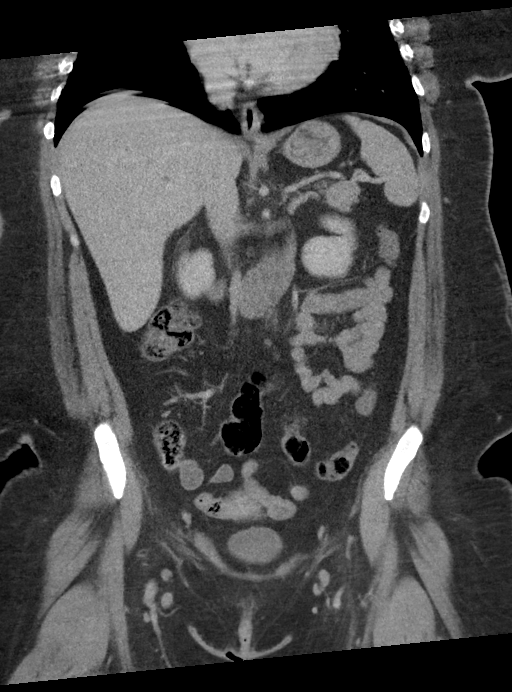

[Series 7: routine abd/pel with · axial · 0.76mm/px · z∈[-511,-71]mm · 12 of 96 slices shown, 14 images]
[im 4/96  soft-tissue]
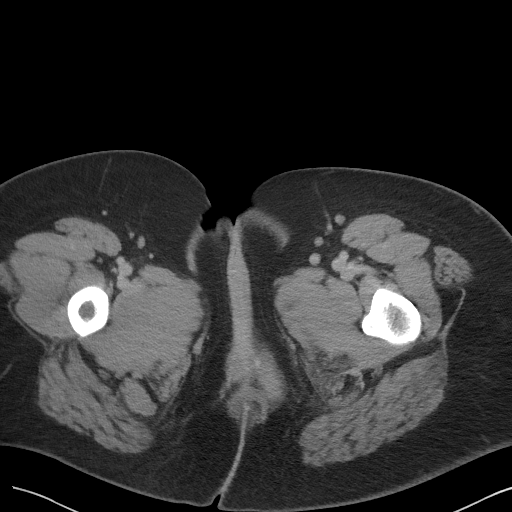
[im 4/96  bone]
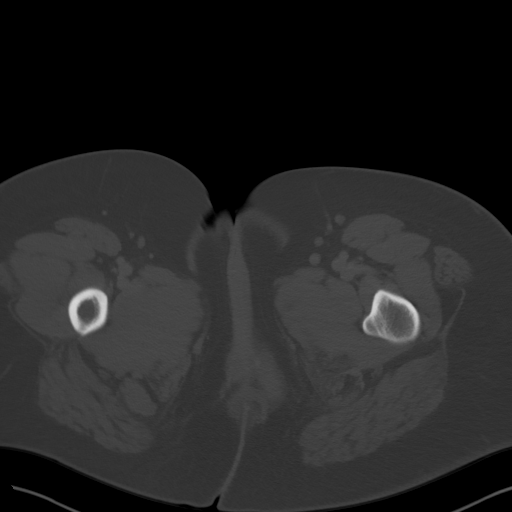
[im 12/96  soft-tissue]
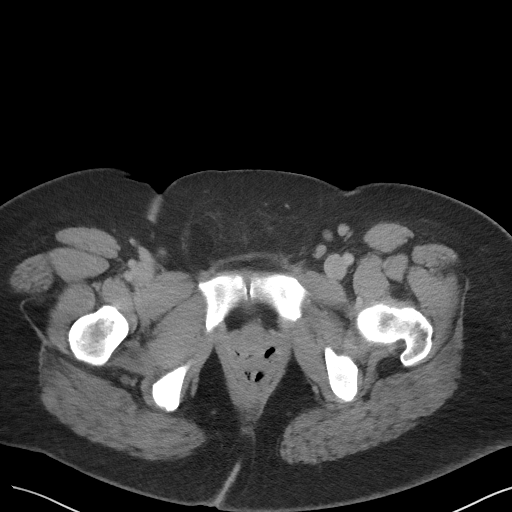
[im 20/96  soft-tissue]
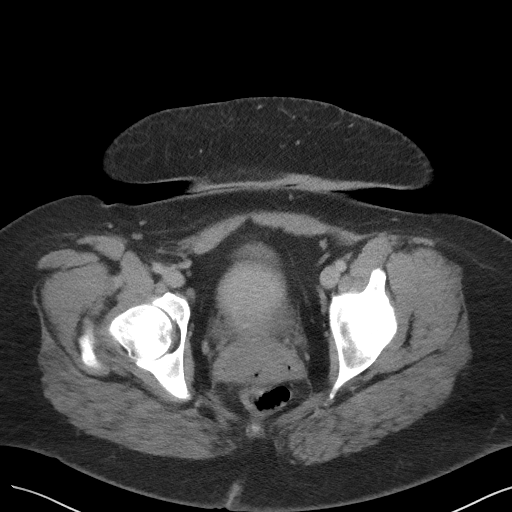
[im 28/96  soft-tissue]
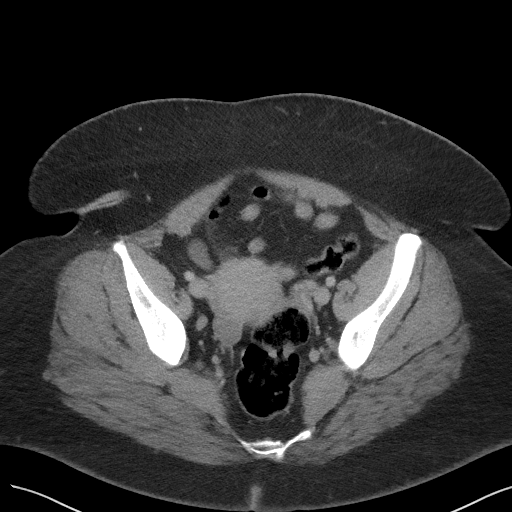
[im 36/96  soft-tissue]
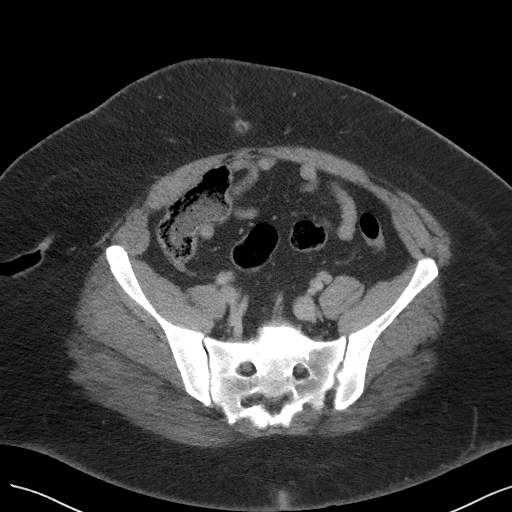
[im 44/96  soft-tissue]
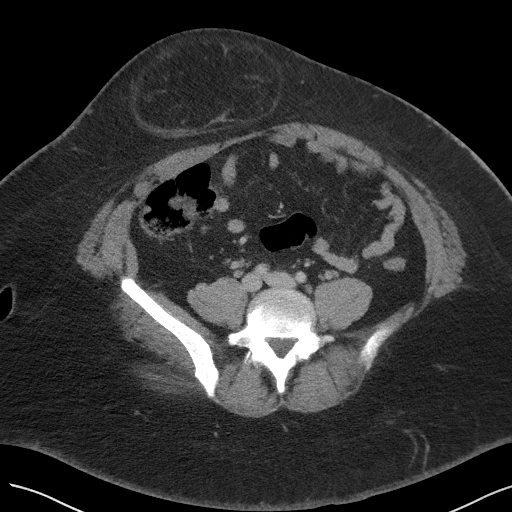
[im 52/96  soft-tissue]
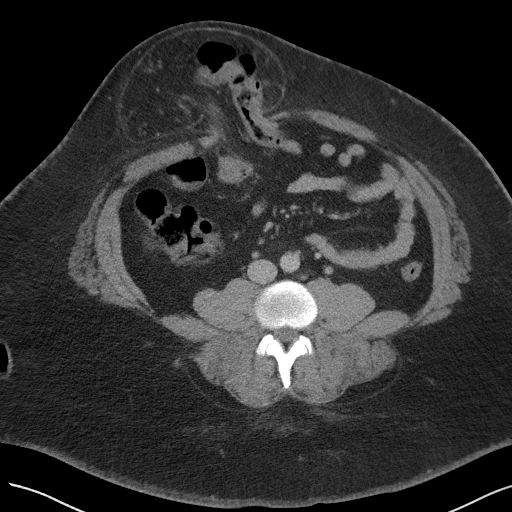
[im 60/96  soft-tissue]
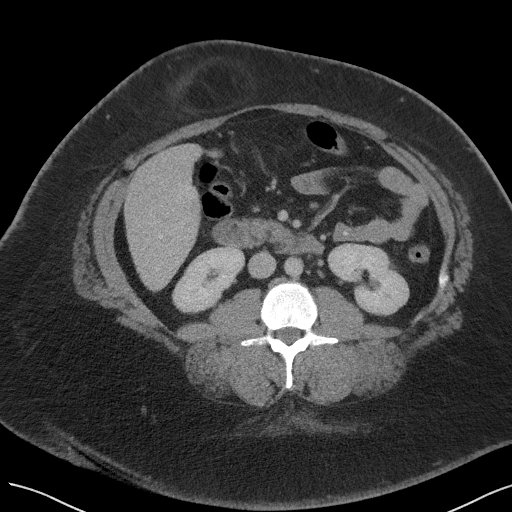
[im 68/96  soft-tissue]
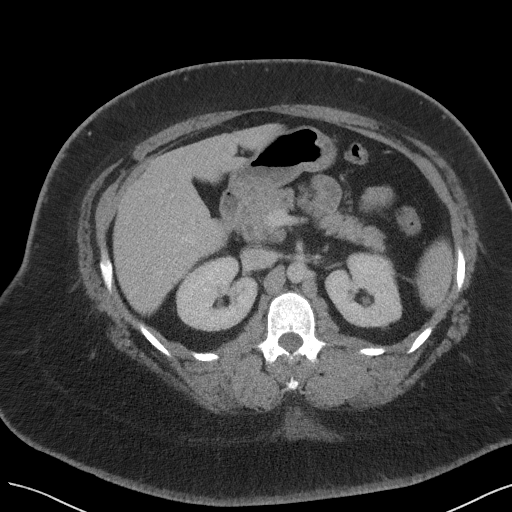
[im 68/96  bone]
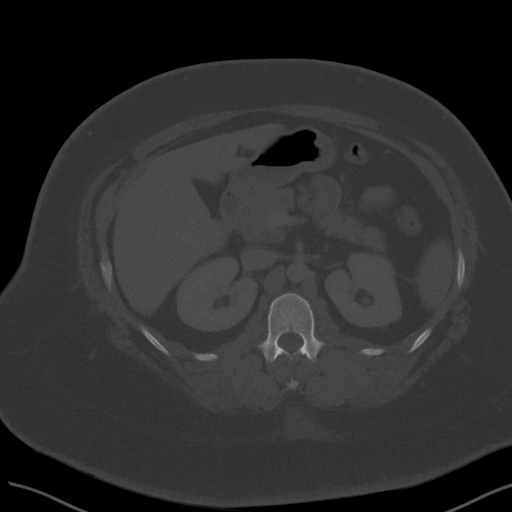
[im 76/96  soft-tissue]
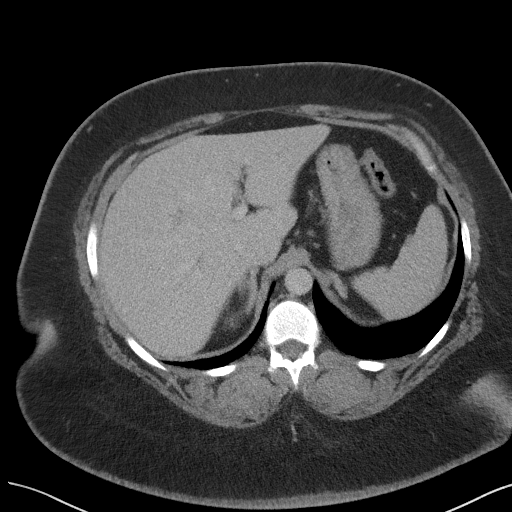
[im 84/96  soft-tissue]
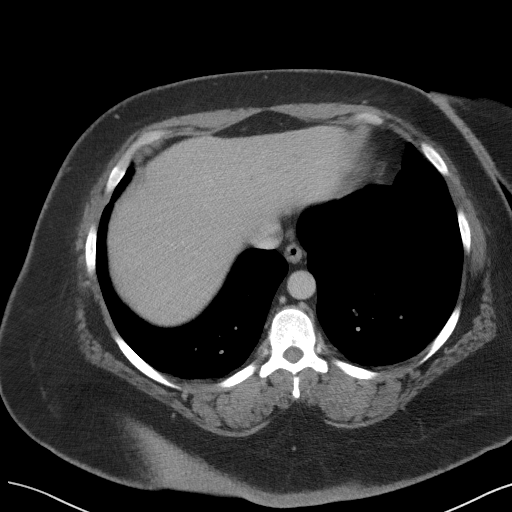
[im 92/96  soft-tissue]
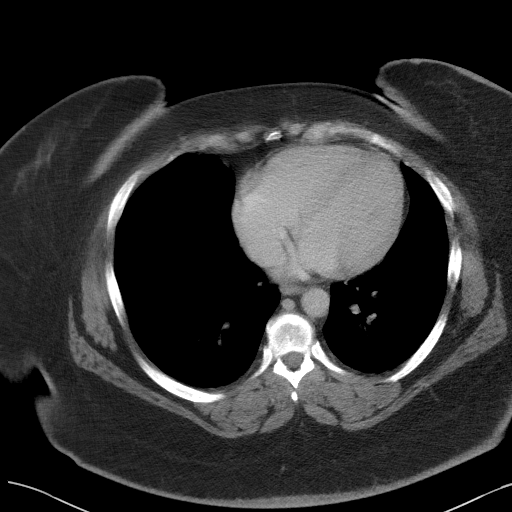

[15 of 46 positions shown; findings below may reference images not displayed]

FINDINGS: Lower chest: Minimal basilar atelectasis/scarring. Heart size
top-normal.

Hepatobiliary: Top-normal size liver. Post cholecystectomy. Slightly
prominent intrahepatic biliary ducts similar to prior exam without
obstructing stone or mass noted. This may be related to post
cholecystectomy state. If patient had elevated liver function
studies than this could be further assessed with MRCP.

Pancreas: No worrisome pancreatic mass or inflammation.

Spleen: No splenic mass or enlargement.

Adrenals/Urinary Tract: No obstructing stone or hydronephrosis.
Slightly lobulated renal contour clefts without worrisome renal
mass. Adrenal glands within normal limits. Noncontrast filled views
of the urinary bladder unremarkable.

Stomach/Bowel: Moderately large right supraumbilical hernia
measuring 13.3 x 8.4 x 11.6 cm through which fat and vessels
traverse as well as proximal to mid transverse colon. More
transverse colon herniates than on prior exam. The transverse colon
extending into the hernia may be minimally inflamed. This is not
causing proximal obstruction at the current time. No other bowel
inflammatory process noted.

Vascular/Lymphatic: No abdominal aortic aneurysm or large vessel
occlusion.

Scattered normal/top-normal size pelvic lymph nodes without
significant change.

Reproductive: No worrisome adnexal or uterine mass.

Other: No free intraperitoneal air.

Musculoskeletal:  No worrisome osseous lesion.
IMPRESSION: 1. Moderately large right supraumbilical hernia measuring 13.3 x
x 11.6 cm through which fat and vessels traverse as well as proximal
to mid transverse colon. More transverse colon herniates than on
prior exam. The transverse colon extending into the hernia may be
minimally inflamed. This is not causing proximal obstruction at the
current time.
2. Slightly prominent intrahepatic biliary ducts similar to prior
exam without obstructing stone or mass noted. This may be related to
post cholecystectomy state. If patient had elevated liver function
studies than this could be further assessed with MRCP.

## 2020-01-05 ENCOUNTER — Observation Stay
Admission: EM | Admit: 2020-01-05 | Discharge: 2020-01-05 | Disposition: A | Discharge status: Home / Self Care | Payer: BC Managed Care – PPO | Attending: Obstetrics and Gynecology | Admitting: Obstetrics and Gynecology

## 2020-01-05 ENCOUNTER — Other Ambulatory Visit: Payer: Self-pay

## 2020-01-05 DIAGNOSIS — O99212 Obesity complicating pregnancy, second trimester: Secondary | ICD-10-CM | POA: Insufficient documentation

## 2020-01-05 DIAGNOSIS — O4702 False labor before 37 completed weeks of gestation, second trimester: Secondary | ICD-10-CM | POA: Diagnosis present

## 2020-01-05 DIAGNOSIS — O132 Gestational [pregnancy-induced] hypertension without significant proteinuria, second trimester: Secondary | ICD-10-CM | POA: Insufficient documentation

## 2020-01-05 DIAGNOSIS — Z3A24 24 weeks gestation of pregnancy: Secondary | ICD-10-CM | POA: Insufficient documentation

## 2020-01-05 LAB — FETAL FIBRONECTIN: Fetal Fibronectin: NEGATIVE

## 2020-01-05 LAB — URINALYSIS, COMPLETE (UACMP) WITH MICROSCOPIC
Bacteria, UA: NONE SEEN
Bilirubin Urine: NEGATIVE
Glucose, UA: NEGATIVE mg/dL
Hgb urine dipstick: NEGATIVE
Ketones, ur: NEGATIVE mg/dL
Nitrite: NEGATIVE
Protein, ur: NEGATIVE mg/dL
Specific Gravity, Urine: 1.027 (ref 1.005–1.030)
pH: 5 (ref 5.0–8.0)

## 2020-01-05 NOTE — Progress Notes (Signed)
Pt sent from [facility] for preterm labor eval. Pt reports cramping that started around midnight. denies bleeding, LOF. =FM.

## 2020-01-05 NOTE — Progress Notes (Signed)
Pt given discharge instructions and follow up acre. Taken to ED via wheelchair

## 2020-01-05 NOTE — Discharge Instructions (Signed)
Follow up next week in the office

## 2020-01-05 NOTE — Discharge Summary (Signed)
Donna Navarro is a 34 y.o. female. She is at [redacted]w[redacted]d gestation. Patient's last menstrual period was 07/15/2019 (exact date). Estimated Date of Delivery: 04/20/20  Prenatal care site: [facility]  Current pregnancy complicated by:  1. Obesity BMI: 49.34  40+ ([facility]) BMI 30+ ([facility])  TWG 11-15 pounds, Dr Feliberto Gottron discussed 2000cal diet  Baseline labs:  Early 1 hour GTT: 09/21/19 129  P/C ratio: 09/30/19  CMP: 09/30/19  A1c: 09/21/19 6.0  Anesthesia consult if >40 ordered 09/30/19 Done on 11/25/19 [X]   Weekly NST with AFI beginning at 32 weeks  Consider ASA if she has other moderate risk factors   Begin EFW and AFI every 4 weeks between 28-32 weeks (if unable to get good measurements by abdominal palpation of fundus)  2. Maternal Age >/= 36 yo  Age at delivery: 34 yo  Genetic Screening: ordered 09/30/19  EFW and AFI- per obesity/CHTN problems.   Delivery 39+0 to 40+0, if declined continue antenatal testing with daily FKC until 41 weeks   3. 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 Ventral hernia- 15cm, seen by Gen Surg 01/2019  5. History of Syphilis -treated last year 2020 at [facility]  6. Hypertension: started on meds by TJS at 17wks  Labetalol 100mg  PO BID   NST/AFI twice weekly at 32-34 weeks  Growth 2021 q4wks 3rd tri      Chief complaint:cramping started around midnight  Location: lower abd Onset/timing: since midnight Duration: n/a Quality: intermittent, menstrual like cramping, vaginal pressure and discharge.  Severity: Aggravating or alleviating conditions:seen in office- wet prep with + clue/whiff, Rx sent for Flagyl.  Associated  signs/symptoms: Context:  S: Resting comfortably. no CTX, no VB.no LOF,  Active fetal movement. Denies: HA, visual changes, SOB, or RUQ/epigastric pain  Maternal Medical History:   Past Medical History:  Diagnosis Date  . GERD (gastroesophageal reflux disease)    with pregnancy-no meds  . Headache    migraines during pregnancy  . History of hiatal hernia    large per pt  . Hypertension    lost weight and pcp took pt off bp meds 7 months  . Incompetent cervix   . Irregular periods   . Morbid obesity (HCC) 04/30/2017  . Obesity affecting pregnancy in second trimester 08/09/2018  . Syphilis affecting pregnancy   . Ventral hernia without obstruction or gangrene 04/30/2017    Past Surgical History:  Procedure Laterality Date  . CERVICAL CERCLAGE N/A 06/17/2018   Procedure: CERCLAGE CERVICAL;  Surgeon: 13/01/2017, MD;  Location: [facility] ORS;  Service: Gynecology;  Laterality: N/A;x 3  . CERVICAL CERCLAGE N/A 10/24/2019   Procedure: CERCLAGE CERVICAL - MCDONALD - MERSILENE BAND;  Surgeon: Natale Milch, MD;  Location: [facility] ORS;  Service: Gynecology;  Laterality: N/A;  . CHOLECYSTECTOMY      No Known Allergies  Prior to Admission medications   Medication Sig Start Date End Date Taking? Authorizing Provider  acetaminophen (TYLENOL) 325 MG tablet Take 650 mg by mouth every 6 (six) hours as needed for moderate pain.    Yes [provider]  calcium carbonate (TUMS - DOSED IN MG ELEMENTAL  CALCIUM) 500 MG chewable tablet Chew 1 tablet by mouth as needed for indigestion or heartburn.   Yes [provider]  Prenatal Vit-Fe Fumarate-FA (PRENATAL MULTIVITAMIN) TABS tablet Take 1 tablet by mouth daily at 12 noon.   Yes [provider]  cefdinir (OMNICEF) 300 MG capsule Take 1 capsule (300 mg total) by mouth 2 (two) times daily. Patient not taking: Reported on 10/07/2019 06/11/19   Faythe Ghee, PA-C  dicyclomine (BENTYL) 20 MG tablet Take 1 tablet (20  mg total) by mouth 3 (three) times daily as needed for up to 5 days for spasms. Patient not taking: Reported on 10/07/2019 05/29/19 06/03/19  Orvil Feil, PA-C  ibuprofen (ADVIL) 600 MG tablet Take 1 tablet (600 mg total) by mouth every 6 (six) hours as needed for cramping. Patient not taking: Reported on 01/31/2019 12/17/18   Tresea Mall, CNM  meloxicam (MOBIC) 15 MG tablet Take 1 tablet (15 mg total) by mouth daily. Patient not taking: Reported on 10/07/2019 06/11/19 06/10/20  Sherrie Mustache Roselyn Bering, PA-C  methocarbamol (ROBAXIN) 500 MG tablet Take 1 tablet (500 mg total) by mouth 4 (four) times daily. Patient not taking: Reported on 10/07/2019 06/11/19   Faythe Ghee, PA-C      Social History: She  reports that she has never smoked. She has never used smokeless tobacco. She reports previous alcohol use. She reports that she does not use drugs.  Family History: family history includes Heart disease in her mother.   Review of Systems: A full review of systems was performed and negative except as noted in the HPI.     O:  BP 111/70   Pulse 91   Resp 18   Ht 5\' 7"  (1.702 m)   Wt (!) 148.8 kg   LMP 07/15/2019 (Exact Date)   BMI 51.37 kg/m  Results for orders placed or performed during the hospital encounter of 01/05/20 (from the past 48 hour(s))  Fetal fibronectin   Collection Time: 01/05/20 12:01 PM  Result Value Ref Range   Fetal Fibronectin NEGATIVE NEGATIVE     Constitutional: NAD, AAOx3  HE/ENT: extraocular movements grossly intact, moist mucous membranes CV: RRR PULM: nl respiratory effort, CTABL     Abd: gravid, non-tender, non-distended, soft; large soft ventral hernia     Ext: Non-tender, Nonedematous   Psych: mood appropriate, speech normal Pelvic: deferred, SSE done in office, cervix closed, intact cerclage knot, vaginal discharge.   Fetal  monitoring: Cat I Appropriate for GA Baseline: 145bpm Variability: moderate Accelerations: absent Decelerations  absent  TOCO: no UCs noted or palpated.     A/P: 34 y.o. [redacted]w[redacted]d here for antenatal surveillance for preterm contractions in 2nd trimester  Principle Diagnosis:  preterm contractions in 2nd trimester    Preterm labor: not present.   Fetal Wellbeing: Reassuring Cat 1 tracing  rx for BV sent to pharmacy  Will send referral for Gen surgery for vental hernia  D/c home stable, preterm labor precautions reviewed, follow-up as scheduled.    [redacted]w[redacted]d, CNM 01/05/20 1:34 PM

## 2020-01-22 ENCOUNTER — Other Ambulatory Visit: Payer: Self-pay

## 2020-01-22 ENCOUNTER — Encounter: Payer: Self-pay | Admitting: Emergency Medicine

## 2020-01-22 ENCOUNTER — Emergency Department
Admission: EM | Admit: 2020-01-22 | Discharge: 2020-01-22 | Disposition: A | Discharge status: Home / Self Care | Payer: Worker's Compensation | Attending: Emergency Medicine | Admitting: Emergency Medicine

## 2020-01-22 DIAGNOSIS — Z3A00 Weeks of gestation of pregnancy not specified: Secondary | ICD-10-CM | POA: Diagnosis not present

## 2020-01-22 DIAGNOSIS — Y929 Unspecified place or not applicable: Secondary | ICD-10-CM | POA: Insufficient documentation

## 2020-01-22 DIAGNOSIS — O10013 Pre-existing essential hypertension complicating pregnancy, third trimester: Secondary | ICD-10-CM | POA: Insufficient documentation

## 2020-01-22 DIAGNOSIS — Y939 Activity, unspecified: Secondary | ICD-10-CM | POA: Diagnosis not present

## 2020-01-22 DIAGNOSIS — W260XXA Contact with knife, initial encounter: Secondary | ICD-10-CM | POA: Diagnosis not present

## 2020-01-22 DIAGNOSIS — O9A213 Injury, poisoning and certain other consequences of external causes complicating pregnancy, third trimester: Secondary | ICD-10-CM | POA: Diagnosis not present

## 2020-01-22 DIAGNOSIS — Y999 Unspecified external cause status: Secondary | ICD-10-CM | POA: Insufficient documentation

## 2020-01-22 DIAGNOSIS — S61412A Laceration without foreign body of left hand, initial encounter: Secondary | ICD-10-CM | POA: Diagnosis not present

## 2020-01-22 NOTE — ED Notes (Signed)
Left hand dressed with gauze and gauze wrap and placed in wrist immobilizer.

## 2020-01-22 NOTE — ED Triage Notes (Signed)
Pt to ED via POV, pt accidentally cut between her left thumb and index finger. Bleeding is controlled. Pt is 7 months pregnant. Pt is in NAD.

## 2020-01-22 NOTE — ED Provider Notes (Signed)
[facility] Emergency Department Provider Note  ____________________________________________  Time seen: Approximately 7:14 PM  I have reviewed the triage vital signs and the nursing notes.   HISTORY  Chief Complaint Laceration    HPI Donna Navarro is a 34 y.o. female that presents to the emergency department for evaluation of laceration to left hand between index finger and thumb today.  Patient cut herself with a clean knife when trying to open a soda can.  Patient is 7 months pregnant.  She has had no pregnancy concerns or complications.  She has no pregnancy concerns or questions today.  Her tetanus is up-to-date.   Past Medical History:  Diagnosis Date  . GERD (gastroesophageal reflux disease)    with pregnancy-no meds  . Headache    migraines during pregnancy  . History of hiatal hernia    large per pt  . Hypertension    lost weight and pcp took pt off bp meds 7 months  . Incompetent cervix   . Irregular periods   . Morbid obesity (HCC) 04/30/2017  . Obesity affecting pregnancy in second trimester 08/09/2018  . Syphilis affecting pregnancy   . Ventral hernia without obstruction or gangrene 04/30/2017    Patient Active Problem List   Diagnosis Date Noted  . Preterm uterine contractions in second trimester, antepartum 01/05/2020  . Normal vaginal delivery 12/15/2018  . Postpartum care following vaginal delivery 12/15/2018  . Incompetent cervix   . Pregnancy 12/12/2018  . Indication for care in labor and delivery, antepartum 12/12/2018  . Decreased fetal movement 12/12/2018  . Diarrhea 12/04/2018  . Pregnancy with abdominal cramping of lower quadrant, antepartum 12/03/2018  . Sciatica 11/02/2018  . 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 Obesity affecting pregnancy, antepartum, third trimester 08/09/2018  . Morbid obesity with BMI of 40.0-44.9, adult (HCC) 08/09/2018  . Cervical cerclage suture present, antepartum  06/28/2018  . High-risk pregnancy supervision, unspecified trimester 05/17/2018  . Cervical incompetence 05/17/2018  . Hx of cerclage, currently pregnant 05/17/2018  . 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 Ventral hernia without obstruction or gangrene 04/30/2017  . Morbid obesity (HCC) 04/30/2017    Past Surgical History:  Procedure Laterality Date  . CERVICAL CERCLAGE N/A 06/17/2018   Procedure: CERCLAGE CERVICAL;  Surgeon: Natale Milch, MD;  Location: [facility] ORS;  Service: Gynecology;  Laterality: N/A;x 3  . CERVICAL CERCLAGE N/A 10/24/2019   Procedure: CERCLAGE CERVICAL - M[facility]ONALD - MERSILENE BAND;  Surgeon: Suzy Bouchard, MD;  Location: [facility] ORS;  Service: Gynecology;  Laterality: N/A;  . CHOLECYSTECTOMY      Prior to Admission medications   Medication Sig Start Date End Date Taking? Authorizing Provider  acetaminophen (TYLENOL) 325 MG tablet Take 650 mg by mouth every 6 (six) hours as needed for moderate pain.     [provider]  calcium carbonate (TUMS - DOSED IN MG ELEMENTAL CALCIUM) 500 MG chewable tablet Chew 1 tablet by mouth as needed for indigestion or heartburn.    [provider]  cefdinir (OMNICEF) 300 MG capsule Take 1 capsule (300 mg total) by mouth 2 (two) times daily. Patient not taking: Reported on 10/07/2019 06/11/19   Faythe Ghee, PA-C  dicyclomine (BENTYL) 20 MG tablet Take 1 tablet (20 mg total) by mouth 3 (three) times daily as needed for up to 5 days for spasms. Patient not taking: Reported on 10/07/2019 05/29/19 06/03/19  Orvil Feil, PA-C  ibuprofen (ADVIL) 600 MG tablet Take 1 tablet (600 mg  total) by mouth every 6 (six) hours as needed for cramping. Patient not taking: Reported on 01/31/2019 12/17/18   Tresea Mall, CNM  meloxicam (MOBIC) 15 MG tablet Take 1 tablet (15 mg total) by mouth daily. Patient not taking: Reported on 10/07/2019 06/11/19 06/10/20  Sherrie Mustache Roselyn Bering, PA-C  methocarbamol (ROBAXIN) 500 MG tablet Take 1 tablet  (500 mg total) by mouth 4 (four) times daily. Patient not taking: Reported on 10/07/2019 06/11/19   Faythe Ghee, PA-C  Prenatal Vit-Fe Fumarate-FA (PRENATAL MULTIVITAMIN) TABS tablet Take 1 tablet by mouth daily at 12 noon.    [provider]    Allergies Patient has no known allergies.  Family History  Problem Relation Age of Onset  . Heart disease Mother        Massive Heart Attack    Social History Social History   Tobacco Use  . Smoking status: Never Smoker  . Smokeless tobacco: Never Used  Vaping Use  . Vaping Use: Never used  Substance Use Topics  . Alcohol use: Not Currently  . Drug use: Never     Review of Systems  Constitutional: No fever/chills Respiratory: No SOB. Gastrointestinal: No abdominal pain.  No nausea, no vomiting.  Musculoskeletal: Positive for hand pain. Skin: Negative for rash, abrasions, ecchymosis.  Positive for hand laceration. Neurological: Negative for numbness or tingling   ____________________________________________   PHYSICAL EXAM:  VITAL SIGNS: ED Triage Vitals [01/22/20 1754]  Enc Vitals Group     BP (!) 132/87     Pulse Rate 95     Resp 16     Temp 99.3 F (37.4 C)     Temp Source Oral     SpO2 98 %     Weight (!) 326 lb (147.9 kg)     Height 5\' 7"  (1.702 m)     Head Circumference      Peak Flow      Pain Score 4     Pain Loc      Pain Edu?      Excl. in GC?      Constitutional: Alert and oriented. Well appearing and in no acute distress. Eyes: Conjunctivae are normal. PERRL. EOMI. Head: Atraumatic. ENT:      Ears:      Nose: No congestion/rhinnorhea.      Mouth/Throat: Mucous membranes are moist.  Neck: No stridor.  Cardiovascular: Normal rate, regular rhythm.  Good peripheral circulation. Respiratory: Normal respiratory effort without tachypnea or retractions. Lungs CTAB. Good air entry to the bases with no decreased or absent breath sounds. Musculoskeletal: Full range of motion to all  extremities. No gross deformities appreciated. Neurologic:  Normal speech and language. No gross focal neurologic deficits are appreciated.  Skin:  Skin is warm, dry.  1/2 cm puncture to left hand between index finger and thumb. Psychiatric: Mood and affect are normal. Speech and behavior are normal. Patient exhibits appropriate insight and judgement.   ____________________________________________   LABS (all labs ordered are listed, but only abnormal results are displayed)  Labs Reviewed - No data to display ____________________________________________  EKG   ____________________________________________  RADIOLOGY   No results found.  ____________________________________________    PROCEDURES  Procedure(s) performed:    Procedures    Medications - No data to display   ____________________________________________   INITIAL IMPRESSION / ASSESSMENT AND PLAN / ED COURSE  Pertinent labs & imaging results that were available during my care of the patient were reviewed by me and considered in my  medical decision making (see chart for details).  Review of the [place] CSRS was performed in accordance of the [facility] prior to dispensing any controlled drugs.     Patient presented to the emergency department for evaluation of hand laceration.  Vital signs and exam are reassuring.  Patient has a small puncture to her left hand.  We discussed 1 suture versus Dermabond.  Patient would likely still need to immobilize thumb with suture to prevent disruption of suture.  Dermabond was applied and thumb splint was given.   Patient is to follow up with primary care as directed. Patient is given ED precautions to return to the ED for any worsening or new symptoms.  Donna Navarro was evaluated in Emergency Department on 01/22/2020 for the symptoms described in the history of present illness. She was evaluated in the context of the global COVID-19 pandemic, which necessitated consideration  that the patient might be at risk for infection with the SARS-CoV-2 virus that causes COVID-19. Institutional protocols and algorithms that pertain to the evaluation of patients at risk for COVID-19 are in a state of rapid change based on information released by regulatory bodies including the CDC and federal and state organizations. These policies and algorithms were followed during the patient's care in the ED.   ____________________________________________  FINAL CLINICAL IMPRESSION(S) / ED DIAGNOSES  Final diagnoses:  Laceration of left hand without foreign body, initial encounter      NEW MEDICATIONS STARTED DURING THIS VISIT:  ED Discharge Orders    None          This chart was dictated using voice recognition software/Dragon. Despite best efforts to proofread, errors can occur which can change the meaning. Any change was purely unintentional.    Enid Derry, PA-C 01/22/20 2245    Phineas Semen, MD 01/22/20 2249

## 2020-01-22 NOTE — ED Notes (Signed)
Pt reports she cut left hand by accident with blade on bottle opener today. Laceration between thumb and pointer finger, bleeding is controlled.

## 2020-02-21 ENCOUNTER — Encounter: Payer: Medicaid Other | Attending: Obstetrics and Gynecology | Admitting: Dietician

## 2020-02-21 ENCOUNTER — Encounter: Payer: Self-pay | Admitting: Dietician

## 2020-02-21 ENCOUNTER — Other Ambulatory Visit: Payer: Self-pay

## 2020-02-21 VITALS — Ht 67.0 in | Wt 325.9 lb

## 2020-02-21 DIAGNOSIS — O99810 Abnormal glucose complicating pregnancy: Secondary | ICD-10-CM

## 2020-02-21 DIAGNOSIS — Z3A00 Weeks of gestation of pregnancy not specified: Secondary | ICD-10-CM | POA: Insufficient documentation

## 2020-02-21 NOTE — Patient Instructions (Addendum)
   Control portions of starchy foods, keep to 1 cup (fist size) or less. Continue to limit sweets, great job!  Keep juice portion to 4oz or less -- dilute with water or sparkling water, or try Healthy Balance juice for low sugar. Propel or other low sugar flavored waters are fine (like [employer])  If you have cereal, aim for low sugar (ideally less than 10grams per serving), 2 or more grams of fiber, and eat only a small portion (1/2 bowl) -- and add a protein food such as nuts or boiled eggs.   If you feel jittery, drink 4oz of juice and then eat a snack or meal within 30 minutes.  Monitor the number of snacks eaten by allowing at least 2 hours between meals and snacks, but do eat something every 3-4 hours. Try having a snack at bedtime such as crackers with peanut butter or cheese, nuts and fruit, yogurt and fruit or granola, tortilla chips with cheese.

## 2020-02-21 NOTE — Progress Notes (Signed)
Medical Nutrition Therapy: Visit start time: 1130  end time: 1230  Assessment:  Diagnosis: elevated BG with pregnancy Past medical history: HTN Psychosocial issues/ stress concerns: none  Preferred learning method:  . Auditory   Current weight: 325.9lbs  Height: 5'7" Medications, supplements: reconciled list in medical record  Progress and evaluation:   Recent elevated HbA1C of 6.1% and Glucose Tolerance Test results of 91 fasting, 171 1hr, 161 2hr, 127 3hr  Patient has reduced her sugar intake and some snacks.   She was following a diet for weight loss prior to pregnancy, lost about 80lbs before pregnant with daughter 1-2 years ago, and was losing again prior to current pregnancy.  She has reduced her work hours due to fatigue; BP has also been elevated.    Physical activity: walking 30 minutes 3x a week; caring for toddler at home  Dietary Intake:  Usual eating pattern includes 3 meals and 1-3 snacks per day. Dining out frequency: 1-2 meals per week.  Breakfast: eggs and sausage; cereal Snack:  Lunch: 11-12pm leftovers; heat n eat foods ie chicken tenders; sandwich Snack: similar to lunch Supper: usually cooks -- steak and mash potatoes; chicken + veg ie peas; burgers; frozen meal Snack: occasionally wakes up feeling jittery -- crackers and vienna sausage (feels better after eating) Beverages: water; juice; propel water  Nutrition Care Education: Topics covered:  Basic nutrition: basic food groups, appropriate nutrient balance, appropriate meal and snack schedule, general nutrition guidelines    Advanced nutrition:  recipe modification, cooking techniques, dining out, food label reading Blood sugar control:  goals for BGs during pregnancy, appropriate meal and snack schedule, appropriate carb intake and balance, healthy carb choices, role of fiber, protein, fat; instructed on appropriate treatment for hypoglycemia symptoms Hypertension: identifying high sodium foods,  identifying food sources of potassium, magnesium Other: food safety during pregnancy  Nutritional Diagnosis:  [place]-2.2 Altered nutrition-related laboratory As related to hyperglycemia.  As evidenced by elevated results with glucose tolerance test and HbA1C of 6.1%. -3.3 Overweight/obesity As related to history of excess calories.  As evidenced by patient with BMI of 51 at [redacted]wks gestation.  Intervention:   Instruction and discussion as noted above.  Patient has been making positive changes already, and is motivated to continue.  Established goals for additional change with direction from patient.   No follow-up scheduled at this time, patient will schedule after next MD visit if needed.   Education Materials given:  . General diet guidelines for Diabetes . Plate Planner with food lists . Sample menus . Blood Sugar record . Goals/ instructions   Learner/ who was taught:  . Patient   Level of understanding: Marland Kitchen Verbalizes/ demonstrates competency   Demonstrated degree of understanding via:   Teach back Learning barriers: . None  Willingness to learn/ readiness for change: . Eager, change in progress   Monitoring and Evaluation:  Dietary intake, exercise, BG control, and body weight      follow up: prn

## 2020-03-15 ENCOUNTER — Observation Stay
Admission: EM | Admit: 2020-03-15 | Discharge: 2020-03-15 | Disposition: A | Discharge status: Home / Self Care | Payer: Medicaid Other | Attending: Obstetrics and Gynecology | Admitting: Obstetrics and Gynecology

## 2020-03-15 ENCOUNTER — Other Ambulatory Visit: Payer: Self-pay

## 2020-03-15 ENCOUNTER — Encounter: Payer: Self-pay | Admitting: Obstetrics and Gynecology

## 2020-03-15 DIAGNOSIS — O133 Gestational [pregnancy-induced] hypertension without significant proteinuria, third trimester: Secondary | ICD-10-CM | POA: Diagnosis not present

## 2020-03-15 DIAGNOSIS — Z3A34 34 weeks gestation of pregnancy: Secondary | ICD-10-CM | POA: Insufficient documentation

## 2020-03-15 DIAGNOSIS — Z79899 Other long term (current) drug therapy: Secondary | ICD-10-CM | POA: Insufficient documentation

## 2020-03-15 DIAGNOSIS — O0993 Supervision of high risk pregnancy, unspecified, third trimester: Secondary | ICD-10-CM | POA: Diagnosis present

## 2020-03-15 DIAGNOSIS — O10919 Unspecified pre-existing hypertension complicating pregnancy, unspecified trimester: Secondary | ICD-10-CM | POA: Diagnosis present

## 2020-03-15 DIAGNOSIS — O24419 Gestational diabetes mellitus in pregnancy, unspecified control: Secondary | ICD-10-CM | POA: Diagnosis not present

## 2020-03-15 NOTE — Discharge Summary (Signed)
Donna Navarro is a 34 y.o. female. She is at [redacted]w[redacted]d gestation. Patient's last menstrual period was 07/15/2019 (exact date). Estimated Date of Delivery: 04/20/20   Prenatal care site: [facility]   Chief Complaint: high risk pregnancy, need for antepartum surveillance due to GDM and CHTN  Pregnancy complicated by: 1. Morbid obesity, BMI 49.3 2. Advanced maternal age, 34yo at delivery 3. 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 Umbilical/Ventral hernia: approx 15cm, 03/05/2020: seen by general surgery 5. History of Syphilis, treated last year 2020 at [facility] 6. Chronic Hypertension, started on meds by TJS at 17wks; Labetalol 100mg  PO BID, weekly NSTs at hospital.   7. Planning BTL, consent signed 03/15/20 8. A1GDM, dx 02/14/20, seen by [facility] 8/31, has not been checking CBG- no supplies as of 03/15/20  S: Resting comfortably. no CTX, no VB.no LOF,  Active fetal movement.    Maternal Medical History:   Past Medical History:  Diagnosis Date  . GERD (gastroesophageal reflux disease)    with pregnancy-no meds  . Headache    migraines during pregnancy  . History of hiatal hernia    large per pt  . Hypertension    lost weight and pcp took pt off bp meds 7 months  . Incompetent cervix   . Irregular periods   . Morbid obesity (HCC) 04/30/2017  . Obesity affecting pregnancy in second trimester 08/09/2018  . Syphilis affecting pregnancy   . Ventral hernia without obstruction or gangrene 04/30/2017    Past Surgical History:  Procedure Laterality Date  . CERVICAL CERCLAGE N/A 06/17/2018   Procedure: CERCLAGE CERVICAL;  Surgeon: 06/19/2018, MD;  Location: [facility];  Service: Gynecology;  Laterality: N/A;x 3  . CERVICAL CERCLAGE N/A 10/24/2019   Procedure: CERCLAGE CERVICAL - MCDONALD - MERSILENE BAND;  Surgeon: 12/24/2019, MD;  Location: [facility] ORS;  Service: Gynecology;  Laterality: N/A;  . CHOLECYSTECTOMY       No Known Allergies  Prior to Admission medications   Medication Sig Start Date End Date Taking? Authorizing Provider  calcium carbonate (TUMS - DOSED IN MG ELEMENTAL CALCIUM) 500 MG chewable tablet Chew 1 tablet by mouth as needed for indigestion or heartburn.   Yes [provider]  labetalol (NORMODYNE) 100 MG tablet Take by mouth. 11/17/19  Yes [provider]  Prenatal Vit-Fe Fumarate-FA (PRENATAL MULTIVITAMIN) TABS tablet Take 1 tablet by mouth daily at 12 noon.   Yes [provider]  acetaminophen (TYLENOL) 325 MG tablet Take 650 mg by mouth every 6 (six) hours as needed for moderate pain.  Patient not taking: Reported on 03/15/2020    [provider]  cefdinir (OMNICEF) 300 MG capsule Take 1 capsule (300 mg total) by mouth 2 (two) times daily. Patient not taking: Reported on 10/07/2019 06/11/19   06/13/19, PA-C  dicyclomine (BENTYL) 20 MG tablet Take 1 tablet (20 mg total) by mouth 3 (three) times daily as needed for up to 5 days for spasms. Patient not taking: Reported on 10/07/2019 05/29/19 06/03/19  14/11/20, PA-C  enoxaparin (LOVENOX) 60 MG/0.6ML injection INJECT 0.6 MLS INTO SKIN QD FOR 8 DAYS Patient not taking: Reported on 03/15/2020 12/17/18   [provider]  ibuprofen (ADVIL) 600 MG tablet Take 1 tablet (600 mg total) by mouth every 6 (six) hours as needed for cramping. Patient not taking: Reported on 01/31/2019 12/17/18   12/19/18, CNM  meloxicam (MOBIC) 15 MG tablet Take 1  tablet (15 mg total) by mouth daily. Patient not taking: Reported on 10/07/2019 06/11/19 06/10/20  Sherrie Mustache Roselyn Bering, PA-C  methocarbamol (ROBAXIN) 500 MG tablet Take 1 tablet (500 mg total) by mouth 4 (four) times daily. Patient not taking: Reported on 10/07/2019 06/11/19   Faythe Ghee, PA-C     Social History: She  reports that she has never smoked. She has never used smokeless tobacco. She reports previous alcohol use. She reports that she  does not use drugs.  Family History: family history includes Heart disease in her mother.   Review of Systems: A full review of systems was performed and negative except as noted in the HPI.     O:  BP 121/69 (BP Location: Right Arm)   Pulse 100   Temp 98.5 F (36.9 C) (Oral)   Resp 18   Ht 5\' 7"  (1.702 m)   Wt (!) 149.2 kg   LMP 07/15/2019 (Exact Date)   BMI 51.53 kg/m  No results found for this or any previous visit (from the past 48 hour(s)).   Constitutional: NAD, AAOx3  HE/ENT: extraocular movements grossly intact, moist mucous membranes CV: RRR PULM: nl respiratory effort, CTABL     Abd: gravid, non-tender, non-distended, soft, large approx 18cm soft hernia noted right of midline above umbilicus.    Ext: Non-tender, Nonedmeatous   Psych: mood appropriate, speech normal Pelvic: deferred  Baseline: 135bpm Variability: moderate Accelerations present x >2 Decelerations absent Time 07/17/2019    A/P: 34 y.o. [redacted]w[redacted]d with high risk pregnancy and antepartum surveillance.   Fetal Wellbeing: Reassuring Cat 1 tracing with Reactive NST   D/c home stable, precautions reviewed, follow-up as scheduled.   ----- [redacted]w[redacted]d, CNM 03/15/2020  9:02 PM

## 2020-03-15 NOTE — Progress Notes (Signed)
Provider reviewed strip, NST reactive and monitors removed. Will return for weekly NST on Sunday 10/3

## 2020-03-15 NOTE — OB Triage Note (Signed)
Pt arrived from office for non-stress test.  Monitors explained and applied.

## 2020-03-22 LAB — OB RESULTS CONSOLE RPR: RPR: REACTIVE

## 2020-03-22 LAB — OB RESULTS CONSOLE GC/CHLAMYDIA
Chlamydia: NEGATIVE
Gonorrhea: NEGATIVE

## 2020-03-22 LAB — OB RESULTS CONSOLE HIV ANTIBODY (ROUTINE TESTING): HIV: NONREACTIVE

## 2020-03-22 LAB — OB RESULTS CONSOLE GBS: GBS: POSITIVE

## 2020-03-25 ENCOUNTER — Encounter: Payer: Self-pay | Admitting: Obstetrics and Gynecology

## 2020-03-25 ENCOUNTER — Other Ambulatory Visit: Payer: Self-pay

## 2020-03-25 ENCOUNTER — Observation Stay
Admission: RE | Admit: 2020-03-25 | Discharge: 2020-03-25 | Disposition: A | Discharge status: Home / Self Care | Payer: Medicaid Other | Attending: Obstetrics and Gynecology | Admitting: Obstetrics and Gynecology

## 2020-03-25 DIAGNOSIS — Z6841 Body Mass Index (BMI) 40.0 and over, adult: Secondary | ICD-10-CM | POA: Insufficient documentation

## 2020-03-25 DIAGNOSIS — O3433 Maternal care for cervical incompetence, third trimester: Secondary | ICD-10-CM | POA: Insufficient documentation

## 2020-03-25 DIAGNOSIS — O10913 Unspecified pre-existing hypertension complicating pregnancy, third trimester: Secondary | ICD-10-CM | POA: Diagnosis not present

## 2020-03-25 DIAGNOSIS — Z3A36 36 weeks gestation of pregnancy: Secondary | ICD-10-CM | POA: Diagnosis not present

## 2020-03-25 DIAGNOSIS — E662 Morbid (severe) obesity with alveolar hypoventilation: Secondary | ICD-10-CM | POA: Diagnosis not present

## 2020-03-25 DIAGNOSIS — Z8619 Personal history of other infectious and parasitic diseases: Secondary | ICD-10-CM | POA: Insufficient documentation

## 2020-03-25 DIAGNOSIS — O0993 Supervision of high risk pregnancy, unspecified, third trimester: Secondary | ICD-10-CM | POA: Diagnosis present

## 2020-03-25 DIAGNOSIS — O99213 Obesity complicating pregnancy, third trimester: Secondary | ICD-10-CM | POA: Diagnosis not present

## 2020-03-25 DIAGNOSIS — Z8719 Personal history of other diseases of the digestive system: Secondary | ICD-10-CM | POA: Insufficient documentation

## 2020-03-25 DIAGNOSIS — O09523 Supervision of elderly multigravida, third trimester: Secondary | ICD-10-CM | POA: Insufficient documentation

## 2020-03-25 DIAGNOSIS — O24119 Pre-existing diabetes mellitus, type 2, in pregnancy, unspecified trimester: Secondary | ICD-10-CM | POA: Diagnosis not present

## 2020-03-25 DIAGNOSIS — O10919 Unspecified pre-existing hypertension complicating pregnancy, unspecified trimester: Secondary | ICD-10-CM | POA: Diagnosis present

## 2020-03-25 DIAGNOSIS — Z79899 Other long term (current) drug therapy: Secondary | ICD-10-CM | POA: Insufficient documentation

## 2020-03-25 MED ORDER — METFORMIN HCL 500 MG PO TABS: 500.0000 mg | ORAL_TABLET | Freq: Every day | ORAL | 1 refills | Status: DC

## 2020-03-25 MED ORDER — LABETALOL HCL 100 MG PO TABS: 100.0000 mg | ORAL_TABLET | Freq: Two times a day (BID) | ORAL | 0 refills | Status: DC

## 2020-03-25 NOTE — Discharge Summary (Signed)
Donna Navarro is a 34 y.o. female. She is at [redacted]w[redacted]d gestation. Patient's last menstrual period was 07/15/2019 (exact date). Estimated Date of Delivery: 04/20/20   Prenatal care site: [facility]   Chief Complaint: high risk pregnancy, need for antepartum surveillance due to GDM and CHTN - Pt reports all CBG at home are above goal, fasting 100-110, and postprandial 140s - vaginal/pelvic pressure  Pregnancy complicated by: 1. Morbid obesity, BMI 49.3 2. Advanced maternal age, 34yo at delivery 3. 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 Umbilical/Ventral hernia: approx 15cm, 03/05/2020: seen by general surgery 5. History of Syphilis, treated last year 2020 at [facility] 6. Chronic Hypertension, started on meds by TJS at 17wks; Labetalol 100mg  PO BID, weekly NSTs at hospital.   7. Planning BTL, consent signed 03/15/20 8. A1GDM, dx 02/14/20, seen by [facility] 8/31, started checking CBG  03/15/20, reports all CBG above goal.   S: Resting comfortably. no CTX, no VB.no LOF,  Active fetal movement.    Maternal Medical History:   Past Medical History:  Diagnosis Date  . GERD (gastroesophageal reflux disease)    with pregnancy-no meds  . Headache    migraines during pregnancy  . History of hiatal hernia    large per pt  . Hypertension    lost weight and pcp took pt off bp meds 7 months  . Incompetent cervix   . Irregular periods   . Morbid obesity (HCC) 04/30/2017  . Obesity affecting pregnancy in second trimester 08/09/2018  . Syphilis affecting pregnancy   . Ventral hernia without obstruction or gangrene 04/30/2017    Past Surgical History:  Procedure Laterality Date  . CERVICAL CERCLAGE N/A 06/17/2018   Procedure: CERCLAGE CERVICAL;  Surgeon: 06/19/2018, MD;  Location: [facility] ORS;  Service: Gynecology;  Laterality: N/A;x 3  . CERVICAL CERCLAGE N/A 10/24/2019   Procedure: CERCLAGE  CERVICAL - MCDONALD - MERSILENE BAND;  Surgeon: 12/24/2019, MD;  Location: [facility] ORS;  Service: Gynecology;  Laterality: N/A;  . CHOLECYSTECTOMY      No Known Allergies  Prior to Admission medications   Medication Sig Start Date End Date Taking? Authorizing Provider  calcium carbonate (TUMS - DOSED IN MG ELEMENTAL CALCIUM) 500 MG chewable tablet Chew 1 tablet by mouth as needed for indigestion or heartburn.   Yes [provider]  labetalol (NORMODYNE) 100 MG tablet Take by mouth. 11/17/19  Yes [provider]  Prenatal Vit-Fe Fumarate-FA (PRENATAL MULTIVITAMIN) TABS tablet Take 1 tablet by mouth daily at 12 noon.   Yes [provider]  acetaminophen (TYLENOL) 325 MG tablet Take 650 mg by mouth every 6 (six) hours as needed for moderate pain.  Patient not taking: Reported on 03/15/2020    [provider]  cefdinir (OMNICEF) 300 MG capsule Take 1 capsule (300 mg total) by mouth 2 (two) times daily. Patient not taking: Reported on 10/07/2019 06/11/19   06/13/19, PA-C  dicyclomine (BENTYL) 20 MG tablet Take 1 tablet (20 mg total) by mouth 3 (three) times daily as needed for up to 5 days for spasms. Patient not taking: Reported on 10/07/2019 05/29/19 06/03/19  14/11/20, PA-C  enoxaparin (LOVENOX) 60 MG/0.6ML injection INJECT 0.6 MLS INTO SKIN QD FOR 8 DAYS Patient not taking: Reported on 03/15/2020 12/17/18   [provider]  ibuprofen (ADVIL) 600 MG tablet Take 1 tablet (600 mg total) by mouth every 6 (six) hours  as needed for cramping. Patient not taking: Reported on 01/31/2019 12/17/18   Tresea Mall, CNM  meloxicam (MOBIC) 15 MG tablet Take 1 tablet (15 mg total) by mouth daily. Patient not taking: Reported on 10/07/2019 06/11/19 06/10/20  Sherrie Mustache Roselyn Bering, PA-C  methocarbamol (ROBAXIN) 500 MG tablet Take 1 tablet (500 mg total) by mouth 4 (four) times daily. Patient not taking: Reported on 10/07/2019 06/11/19   Faythe Ghee, PA-C      Social History: She  reports that she has never smoked. She has never used smokeless tobacco. She reports previous alcohol use. She reports that she does not use drugs.  Family History: family history includes Heart disease in her mother.   Review of Systems: A full review of systems was performed and negative except as noted in the HPI.     O:  LMP 07/15/2019 (Exact Date)  No results found for this or any previous visit (from the past 48 hour(s)).   Constitutional: NAD, AAOx3  HE/ENT: extraocular movements grossly intact, moist mucous membranes CV: RRR PULM: nl respiratory effort, CTABL     Abd: gravid, non-tender, non-distended, soft, large approx 18cm soft hernia noted right of midline above umbilicus.    Ext: Non-tender, Nonedmeatous   Psych: mood appropriate, speech normal Pelvic: cx closed/50/-2, posterior, soft.   Baseline: 145bpm Variability: moderate Accelerations present x >2 Decelerations absent Time  TOCO: occasional UC noted.    A/P: 34 y.o. [redacted]w[redacted]d with high risk pregnancy and antepartum surveillance.   Fetal Wellbeing: Reassuring Cat 1 tracing with Reactive NST  Reviewed elevated CBG reported by pt with Dr Dalbert Garnet, will start Metformin 500mg  qHS.    D/c home stable, precautions reviewed, follow-up as scheduled.   ----- , CNM 03/25/2020  4:08 PM

## 2020-03-25 NOTE — OB Triage Note (Signed)
Patient here for NST. She had her cerclage removed this week and is feeling some pressure but has not had any contractions. Denies LOF and bleeding.

## 2020-04-02 IMAGING — US US OB TRANSVAGINAL
1 series · 14 of 28 positions shown · non-contrast
Comparison: Early OB ultrasound 05/13/2018.

CLINICAL DATA: Pregnant patient with bleeding and cramping for 1
day.

EXAM:
OBSTETRIC <14 WK US AND TRANSVAGINAL OB US
TECHNIQUE: Both transabdominal and transvaginal ultrasound examinations were
performed for complete evaluation of the gestation as well as the
maternal uterus, adnexal regions, and pelvic cul-de-sac.
Transvaginal technique was performed to assess early pregnancy.

[Series 1: us ob transvaginal · 14 of 117 slices shown]
[im 5/117]
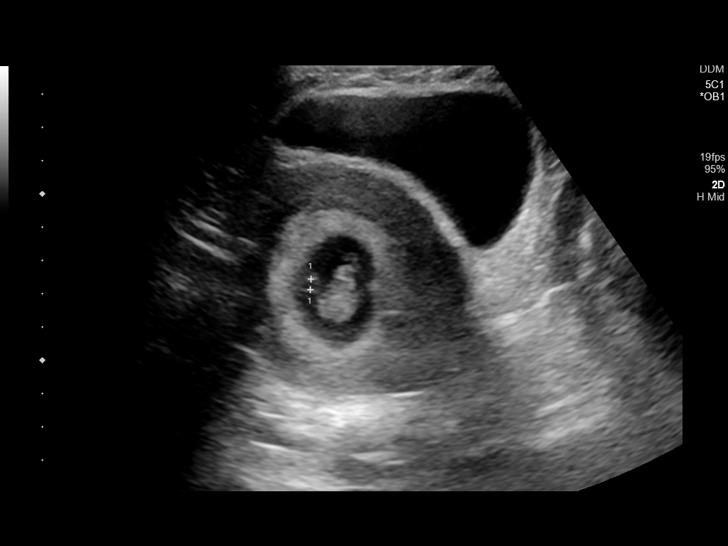
[im 13/117]
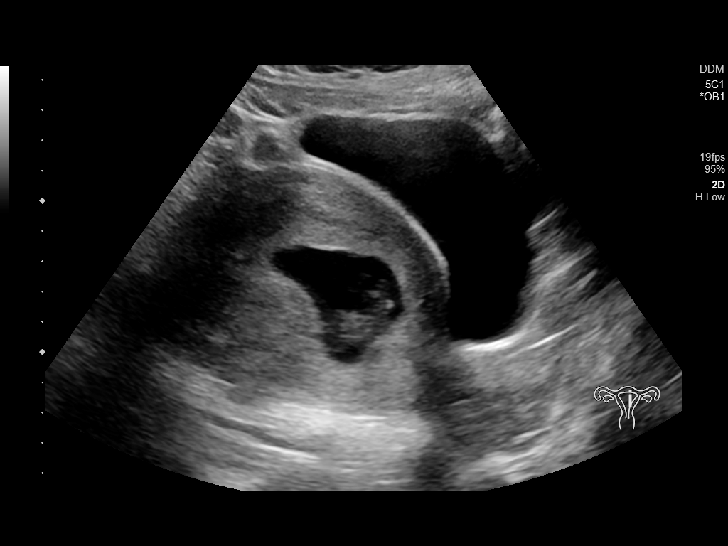
[im 22/117]
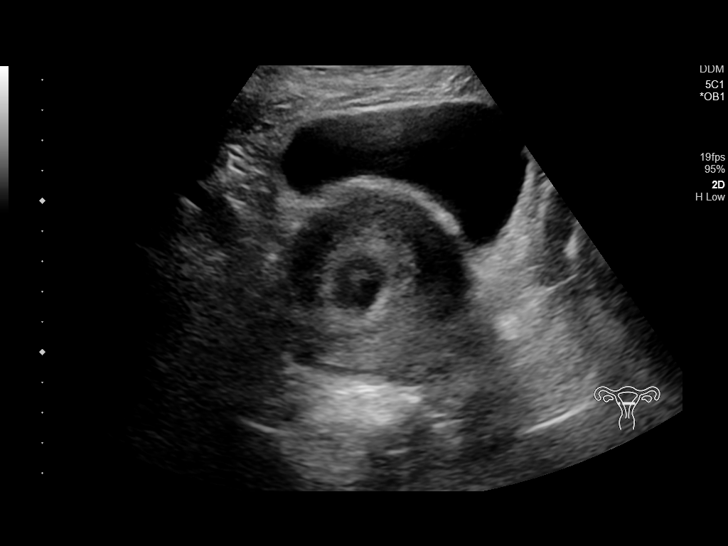
[im 31/117]
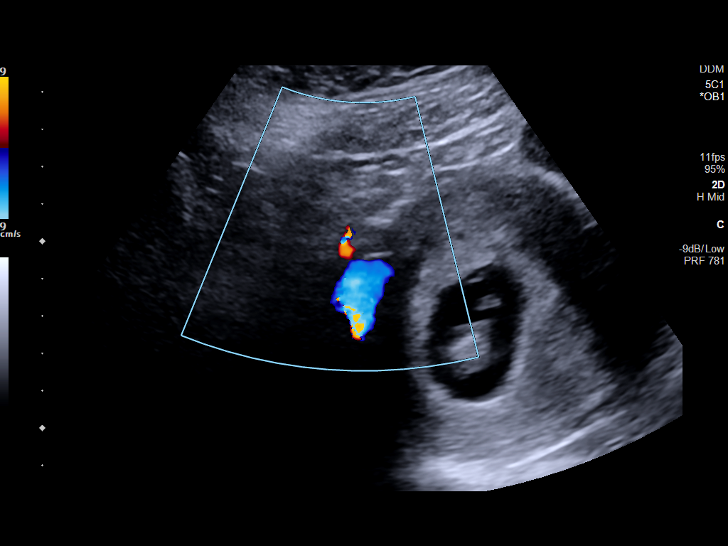
[im 39/117]
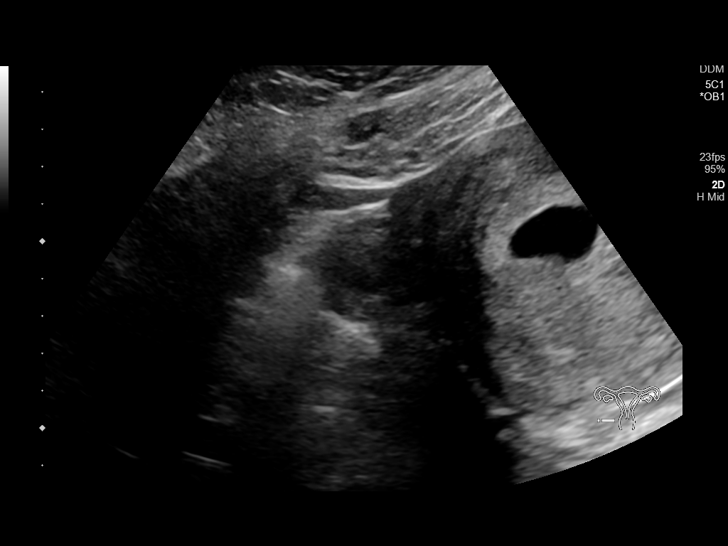
[im 48/117]
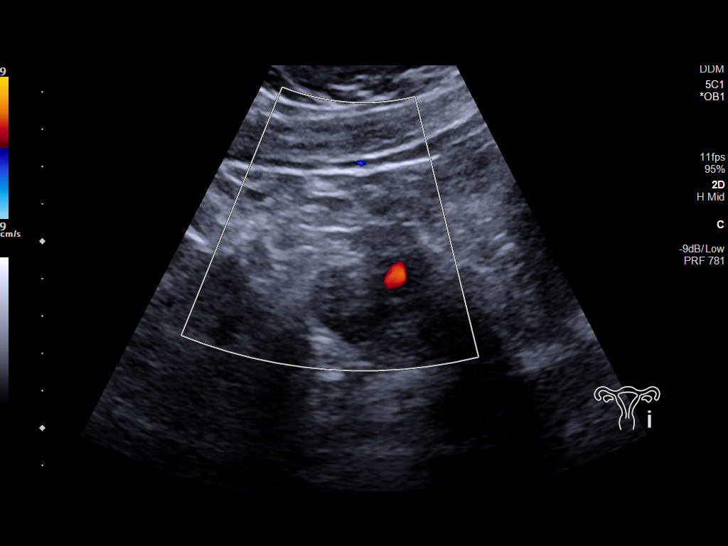
[im 56/117]
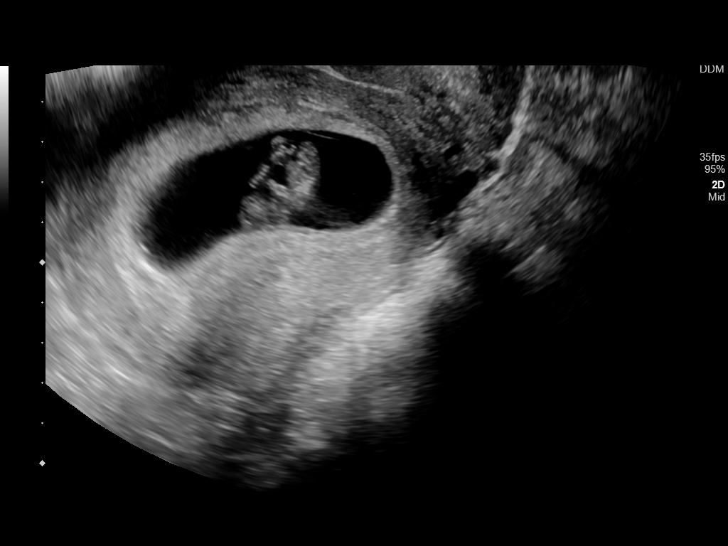
[im 65/117]
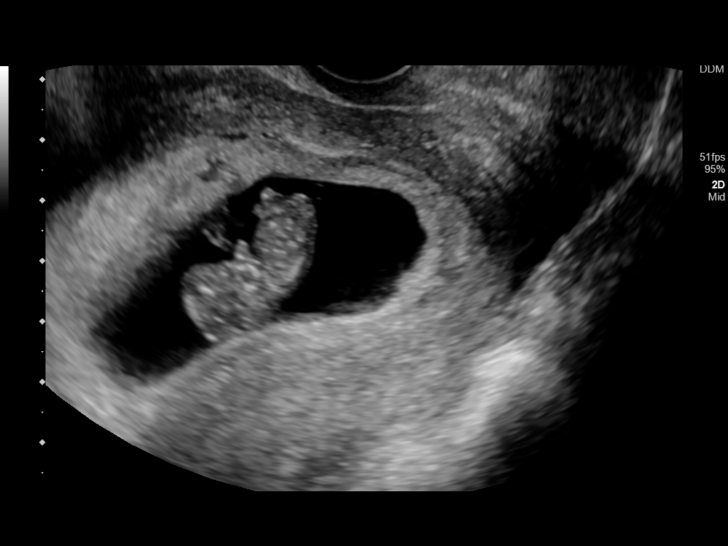
[im 74/117]
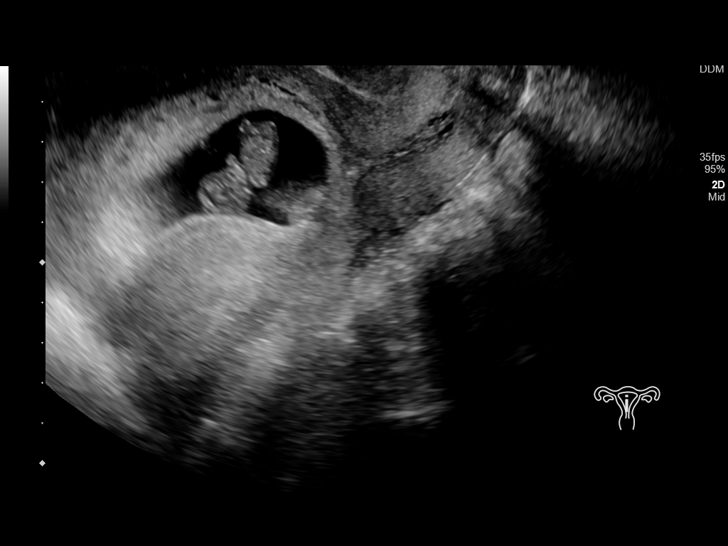
[im 82/117]
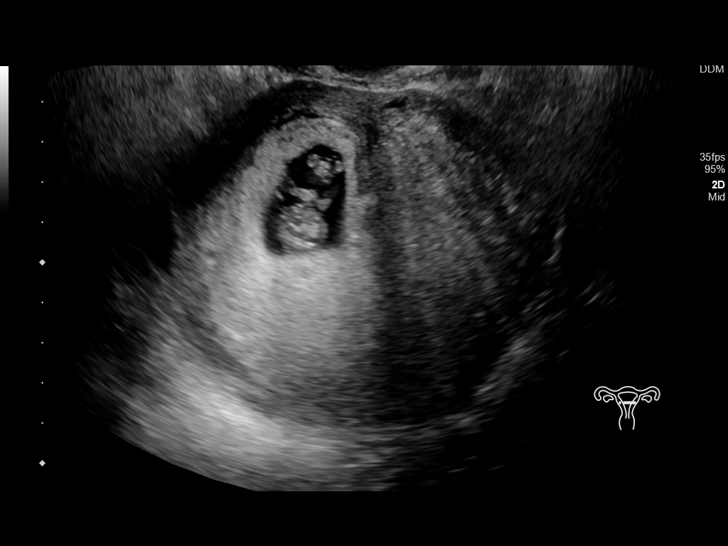
[im 91/117]
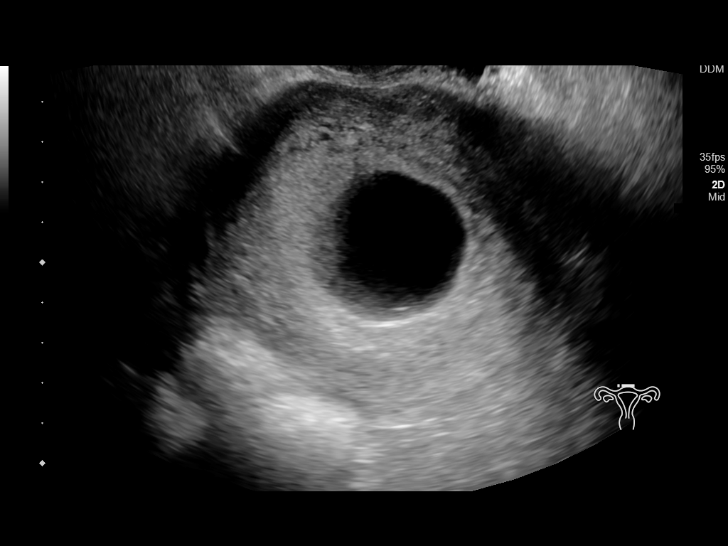
[im 99/117]
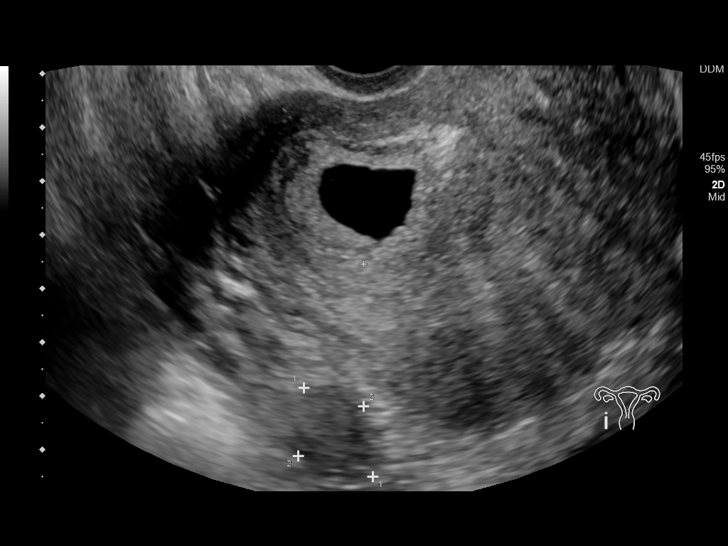
[im 108/117]
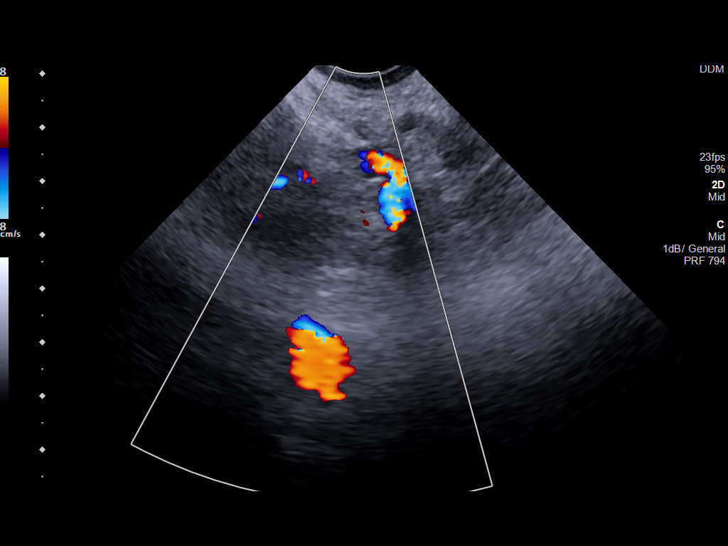
[im 117/117]
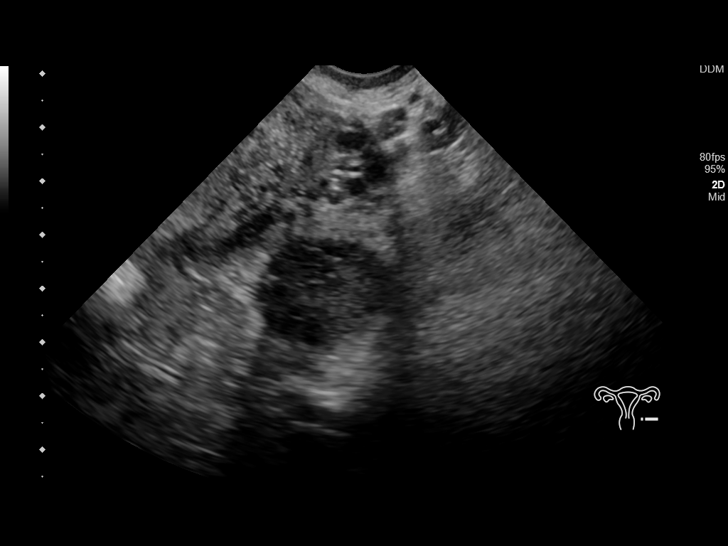

[14 of 28 positions shown; findings below may reference images not displayed]

FINDINGS: Intrauterine gestational sac: Single.

Yolk sac:  Visualized.

Embryo:  Visualized.

Cardiac Activity: Detected.

Heart Rate: 169 bpm

MSD: 28 mm   9 w   4 d      US EDC: 12/22/2018.

Subchorionic hemorrhage:  None visualized.

Maternal uterus/adnexae: Normal.
IMPRESSION: Normal examination. Small subchorionic hemorrhage seen on the prior
study is not visible today.

## 2020-04-05 ENCOUNTER — Other Ambulatory Visit: Payer: Self-pay

## 2020-04-05 ENCOUNTER — Other Ambulatory Visit
Admission: RE | Admit: 2020-04-05 | Discharge: 2020-04-05 | Disposition: A | Discharge status: Home / Self Care | Payer: Medicaid Other | Source: Ambulatory Visit | Attending: Obstetrics and Gynecology | Admitting: Obstetrics and Gynecology

## 2020-04-05 DIAGNOSIS — Z01812 Encounter for preprocedural laboratory examination: Secondary | ICD-10-CM | POA: Insufficient documentation

## 2020-04-05 DIAGNOSIS — Z20822 Contact with and (suspected) exposure to covid-19: Secondary | ICD-10-CM | POA: Insufficient documentation

## 2020-04-05 LAB — SARS CORONAVIRUS 2 (TAT 6-24 HRS): SARS Coronavirus 2: NEGATIVE

## 2020-04-06 ENCOUNTER — Encounter: Payer: Self-pay | Admitting: Obstetrics & Gynecology

## 2020-04-06 ENCOUNTER — Other Ambulatory Visit: Payer: Self-pay | Admitting: Obstetrics & Gynecology

## 2020-04-06 ENCOUNTER — Inpatient Hospital Stay: Payer: Medicaid Other | Admitting: Certified Registered Nurse Anesthetist

## 2020-04-06 ENCOUNTER — Inpatient Hospital Stay
Admission: EM | Admit: 2020-04-06 | Discharge: 2020-04-07 | DRG: 807 | Disposition: A | Discharge status: Home / Self Care | Payer: Medicaid Other | Attending: Obstetrics and Gynecology | Admitting: Obstetrics and Gynecology

## 2020-04-06 DIAGNOSIS — Z20822 Contact with and (suspected) exposure to covid-19: Secondary | ICD-10-CM | POA: Diagnosis present

## 2020-04-06 DIAGNOSIS — O24425 Gestational diabetes mellitus in childbirth, controlled by oral hypoglycemic drugs: Secondary | ICD-10-CM | POA: Diagnosis present

## 2020-04-06 DIAGNOSIS — Z23 Encounter for immunization: Secondary | ICD-10-CM

## 2020-04-06 DIAGNOSIS — O1002 Pre-existing essential hypertension complicating childbirth: Secondary | ICD-10-CM | POA: Diagnosis present

## 2020-04-06 DIAGNOSIS — Z3A38 38 weeks gestation of pregnancy: Secondary | ICD-10-CM | POA: Diagnosis not present

## 2020-04-06 DIAGNOSIS — O99892 Other specified diseases and conditions complicating childbirth: Secondary | ICD-10-CM | POA: Diagnosis present

## 2020-04-06 DIAGNOSIS — Z01812 Encounter for preprocedural laboratory examination: Secondary | ICD-10-CM

## 2020-04-06 DIAGNOSIS — O26893 Other specified pregnancy related conditions, third trimester: Secondary | ICD-10-CM | POA: Diagnosis present

## 2020-04-06 DIAGNOSIS — K429 Umbilical hernia without obstruction or gangrene: Secondary | ICD-10-CM | POA: Diagnosis present

## 2020-04-06 DIAGNOSIS — O99214 Obesity complicating childbirth: Secondary | ICD-10-CM | POA: Diagnosis present

## 2020-04-06 DIAGNOSIS — O10919 Unspecified pre-existing hypertension complicating pregnancy, unspecified trimester: Secondary | ICD-10-CM | POA: Diagnosis present

## 2020-04-06 LAB — CBC
HCT: 38.2 % (ref 36.0–46.0)
Hemoglobin: 12.3 g/dL (ref 12.0–15.0)
MCH: 26.3 pg (ref 26.0–34.0)
MCHC: 32.2 g/dL (ref 30.0–36.0)
MCV: 81.8 fL (ref 80.0–100.0)
Platelets: 323 10*3/uL (ref 150–400)
RBC: 4.67 MIL/uL (ref 3.87–5.11)
RDW: 14.5 % (ref 11.5–15.5)
WBC: 16.1 10*3/uL — ABNORMAL HIGH (ref 4.0–10.5)
nRBC: 0 % (ref 0.0–0.2)

## 2020-04-06 LAB — PROTEIN / CREATININE RATIO, URINE
Creatinine, Urine: 293 mg/dL
Protein Creatinine Ratio: 0.06 mg/mg{Cre} (ref 0.00–0.15)
Total Protein, Urine: 17 mg/dL

## 2020-04-06 LAB — COMPREHENSIVE METABOLIC PANEL
ALT: 8 U/L (ref 0–44)
AST: 15 U/L (ref 15–41)
Albumin: 2.9 g/dL — ABNORMAL LOW (ref 3.5–5.0)
Alkaline Phosphatase: 139 U/L — ABNORMAL HIGH (ref 38–126)
Anion gap: 10 (ref 5–15)
BUN: 8 mg/dL (ref 6–20)
CO2: 19 mmol/L — ABNORMAL LOW (ref 22–32)
Calcium: 9.1 mg/dL (ref 8.9–10.3)
Chloride: 106 mmol/L (ref 98–111)
Creatinine, Ser: 0.62 mg/dL (ref 0.44–1.00)
GFR, Estimated: >60 mL/min (ref >60)
Glucose, Bld: 102 mg/dL — ABNORMAL HIGH (ref 70–99)
Potassium: 3.7 mmol/L (ref 3.5–5.1)
Sodium: 135 mmol/L (ref 135–145)
Total Bilirubin: 0.7 mg/dL (ref 0.3–1.2)
Total Protein: 6.9 g/dL (ref 6.5–8.1)

## 2020-04-06 LAB — TYPE AND SCREEN
ABO/RH(D): A POS
Antibody Screen: NEGATIVE

## 2020-04-06 LAB — GLUCOSE, CAPILLARY: Glucose-Capillary: 98 mg/dL (ref 70–99)

## 2020-04-06 MED ORDER — MISOPROSTOL 200 MCG PO TABS
ORAL_TABLET | ORAL | Status: AC
  Filled 2020-04-06: qty 4

## 2020-04-06 MED ORDER — DIPHENHYDRAMINE HCL 25 MG PO CAPS: 25.0000 mg | ORAL_CAPSULE | Freq: Four times a day (QID) | ORAL | Status: DC | PRN

## 2020-04-06 MED ORDER — LACTATED RINGERS IV SOLN: 500.0000 mL | INTRAVENOUS | Status: DC | PRN

## 2020-04-06 MED ORDER — BENZOCAINE-MENTHOL 20-0.5 % EX AERO: 1.0000 "application " | INHALATION_SPRAY | CUTANEOUS | Status: DC | PRN

## 2020-04-06 MED ORDER — OXYTOCIN-SODIUM CHLORIDE 30-0.9 UT/500ML-% IV SOLN
1.0000 m[IU]/min | INTRAVENOUS | Status: DC
  Administered 2020-04-06: 2 m[IU]/min via INTRAVENOUS
  Filled 2020-04-06: qty 1000

## 2020-04-06 MED ORDER — TETANUS-DIPHTH-ACELL PERTUSSIS 5-2.5-18.5 LF-MCG/0.5 IM SUSP
0.5000 mL | Freq: Once | INTRAMUSCULAR | Status: AC
  Administered 2020-04-07: 0.5 mL via INTRAMUSCULAR
  Filled 2020-04-06 (×2): qty 0.5

## 2020-04-06 MED ORDER — OXYTOCIN 10 UNIT/ML IJ SOLN
INTRAMUSCULAR | Status: AC
  Filled 2020-04-06: qty 2

## 2020-04-06 MED ORDER — BUPIVACAINE HCL (PF) 0.25 % IJ SOLN
INTRAMUSCULAR | Status: DC | PRN
  Administered 2020-04-06 (×2): 4 mL via EPIDURAL

## 2020-04-06 MED ORDER — FENTANYL 2.5 MCG/ML W/ROPIVACAINE 0.15% IN NS 100 ML EPIDURAL (ARMC)
EPIDURAL | Status: AC
  Filled 2020-04-06: qty 100

## 2020-04-06 MED ORDER — LIDOCAINE HCL (PF) 1 % IJ SOLN: 30.0000 mL | INTRAMUSCULAR | Status: DC | PRN

## 2020-04-06 MED ORDER — LIDOCAINE HCL (PF) 1 % IJ SOLN
INTRAMUSCULAR | Status: AC
  Filled 2020-04-06: qty 30

## 2020-04-06 MED ORDER — FENTANYL 2.5 MCG/ML W/ROPIVACAINE 0.15% IN NS 100 ML EPIDURAL (ARMC)
EPIDURAL | Status: DC | PRN
Start: 2020-04-06 — End: 2020-04-06
  Administered 2020-04-06: 12 mL/h via EPIDURAL

## 2020-04-06 MED ORDER — LIDOCAINE-EPINEPHRINE (PF) 1.5 %-1:200000 IJ SOLN
INTRAMUSCULAR | Status: DC | PRN
  Administered 2020-04-06: 3 mL via PERINEURAL

## 2020-04-06 MED ORDER — ENOXAPARIN SODIUM 40 MG/0.4ML ~~LOC~~ SOLN: 40.0000 mg | SUBCUTANEOUS | Status: DC

## 2020-04-06 MED ORDER — LACTATED RINGERS IV SOLN: INTRAVENOUS | Status: DC

## 2020-04-06 MED ORDER — ENOXAPARIN SODIUM 80 MG/0.8ML ~~LOC~~ SOLN
0.5000 mg/kg | SUBCUTANEOUS | Status: DC
  Filled 2020-04-06: qty 0.8

## 2020-04-06 MED ORDER — ONDANSETRON HCL 4 MG PO TABS
4.0000 mg | ORAL_TABLET | ORAL | Status: DC | PRN
  Filled 2020-04-06: qty 1

## 2020-04-06 MED ORDER — AMMONIA AROMATIC IN INHA
RESPIRATORY_TRACT | Status: AC
  Filled 2020-04-06: qty 10

## 2020-04-06 MED ORDER — COCONUT OIL OIL: 1.0000 "application " | TOPICAL_OIL | Status: DC | PRN

## 2020-04-06 MED ORDER — ZOLPIDEM TARTRATE 5 MG PO TABS: 5.0000 mg | ORAL_TABLET | Freq: Every evening | ORAL | Status: DC | PRN

## 2020-04-06 MED ORDER — OXYTOCIN BOLUS FROM INFUSION
333.0000 mL | Freq: Once | INTRAVENOUS | Status: AC
  Administered 2020-04-06: 333 mL via INTRAVENOUS

## 2020-04-06 MED ORDER — METFORMIN HCL 500 MG PO TABS
500.0000 mg | ORAL_TABLET | Freq: Every day | ORAL | Status: DC
  Administered 2020-04-06: 500 mg via ORAL
  Filled 2020-04-06 (×2): qty 1

## 2020-04-06 MED ORDER — FENTANYL CITRATE (PF) 100 MCG/2ML IJ SOLN: 50.0000 ug | INTRAMUSCULAR | Status: DC | PRN

## 2020-04-06 MED ORDER — IBUPROFEN 600 MG PO TABS
600.0000 mg | ORAL_TABLET | Freq: Four times a day (QID) | ORAL | Status: DC
  Administered 2020-04-06 – 2020-04-07 (×4): 600 mg via ORAL
  Filled 2020-04-06 (×4): qty 1

## 2020-04-06 MED ORDER — PENICILLIN G POT IN DEXTROSE 60000 UNIT/ML IV SOLN
3.0000 10*6.[IU] | INTRAVENOUS | Status: DC
  Administered 2020-04-06: 3 10*6.[IU] via INTRAVENOUS
  Filled 2020-04-06: qty 50

## 2020-04-06 MED ORDER — OXYTOCIN-SODIUM CHLORIDE 30-0.9 UT/500ML-% IV SOLN
2.5000 [IU]/h | INTRAVENOUS | Status: DC
  Administered 2020-04-06: 2.5 [IU]/h via INTRAVENOUS

## 2020-04-06 MED ORDER — SOD CITRATE-CITRIC ACID 500-334 MG/5ML PO SOLN: 30.0000 mL | ORAL | Status: DC | PRN

## 2020-04-06 MED ORDER — METFORMIN HCL 500 MG PO TABS
1000.0000 mg | ORAL_TABLET | Freq: Every day | ORAL | Status: DC
  Filled 2020-04-06: qty 2

## 2020-04-06 MED ORDER — SIMETHICONE 80 MG PO CHEW: 80.0000 mg | CHEWABLE_TABLET | ORAL | Status: DC | PRN

## 2020-04-06 MED ORDER — ONDANSETRON HCL 4 MG/2ML IJ SOLN
4.0000 mg | INTRAMUSCULAR | Status: DC | PRN
  Administered 2020-04-06: 4 mg via INTRAVENOUS
  Filled 2020-04-06: qty 2

## 2020-04-06 MED ORDER — WITCH HAZEL-GLYCERIN EX PADS: 1.0000 "application " | MEDICATED_PAD | CUTANEOUS | Status: DC | PRN

## 2020-04-06 MED ORDER — TERBUTALINE SULFATE 1 MG/ML IJ SOLN: 0.2500 mg | Freq: Once | INTRAMUSCULAR | Status: DC | PRN

## 2020-04-06 MED ORDER — PRENATAL MULTIVITAMIN CH
1.0000 | ORAL_TABLET | Freq: Every day | ORAL | Status: DC
  Administered 2020-04-07: 1 via ORAL
  Filled 2020-04-06: qty 1

## 2020-04-06 MED ORDER — LABETALOL HCL 100 MG PO TABS
100.0000 mg | ORAL_TABLET | Freq: Two times a day (BID) | ORAL | Status: DC
  Administered 2020-04-06 – 2020-04-07 (×2): 100 mg via ORAL
  Filled 2020-04-06 (×2): qty 1

## 2020-04-06 MED ORDER — DIBUCAINE (PERIANAL) 1 % EX OINT: 1.0000 "application " | TOPICAL_OINTMENT | CUTANEOUS | Status: DC | PRN

## 2020-04-06 MED ORDER — ENOXAPARIN SODIUM 30 MG/0.3ML ~~LOC~~ SOLN
30.0000 mg | Freq: Two times a day (BID) | SUBCUTANEOUS | Status: DC
  Administered 2020-04-07: 30 mg via SUBCUTANEOUS
  Filled 2020-04-06 (×3): qty 0.3

## 2020-04-06 MED ORDER — SENNOSIDES-DOCUSATE SODIUM 8.6-50 MG PO TABS
2.0000 | ORAL_TABLET | ORAL | Status: DC
  Administered 2020-04-06: 2 via ORAL
  Filled 2020-04-06: qty 2

## 2020-04-06 MED ORDER — ACETAMINOPHEN 325 MG PO TABS
650.0000 mg | ORAL_TABLET | ORAL | Status: DC | PRN
  Administered 2020-04-06 (×2): 650 mg via ORAL
  Filled 2020-04-06 (×2): qty 2

## 2020-04-06 MED ORDER — SODIUM CHLORIDE 0.9 % IV SOLN
5.0000 10*6.[IU] | Freq: Once | INTRAVENOUS | Status: AC
  Administered 2020-04-06: 5 10*6.[IU] via INTRAVENOUS
  Filled 2020-04-06: qty 5

## 2020-04-06 MED ORDER — LIDOCAINE HCL (PF) 1 % IJ SOLN
INTRAMUSCULAR | Status: DC | PRN
  Administered 2020-04-06 (×2): 2 mL

## 2020-04-06 MED ORDER — ACETAMINOPHEN 325 MG PO TABS: 650.0000 mg | ORAL_TABLET | ORAL | Status: DC | PRN

## 2020-04-06 MED ORDER — ONDANSETRON HCL 4 MG/2ML IJ SOLN: 4.0000 mg | Freq: Four times a day (QID) | INTRAMUSCULAR | Status: DC | PRN

## 2020-04-06 NOTE — Anesthesia Procedure Notes (Signed)
Epidural Patient location during procedure: OB Start time: 04/06/2020 2:02 PM End time: 04/06/2020 2:28 PM  Staffing Anesthesiologist: Yevette Edwards, MD Resident/CRNA: Hezzie Bump, CRNA Performed: resident/CRNA   Preanesthetic Checklist Completed: patient identified, IV checked, site marked, risks and benefits discussed, surgical consent, monitors and equipment checked, pre-op evaluation and timeout performed  Epidural Patient position: sitting Prep: ChloraPrep Patient monitoring: heart rate, continuous pulse ox and blood pressure Approach: midline Location: L3-L4 Injection technique: LOR saline  Needle:  Needle type: Tuohy  Needle gauge: 17 G Needle length: 9 cm and 9 Needle insertion depth: 9.5 cm Catheter type: closed end flexible Catheter size: 19 Gauge Catheter at skin depth: 14 cm Test dose: negative and 1.5% lidocaine with Epi 1:200 K  Assessment Sensory level: T10 Events: blood not aspirated, injection not painful, no injection resistance, no paresthesia and negative IV test  Additional Notes 1 attempt Pt. Evaluated and documentation done after procedure finished. Patient identified. Risks/Benefits/Options discussed with patient including but not limited to bleeding, infection, nerve damage, paralysis, failed block, incomplete pain control, headache, blood pressure changes, nausea, vomiting, reactions to medication both or allergic, itching and postpartum back pain. Confirmed with bedside nurse the patient's most recent platelet count. Confirmed with patient that they are not currently taking any anticoagulation, have any bleeding history or any family history of bleeding disorders. Patient expressed understanding and wished to proceed. All questions were answered. Sterile technique was used throughout the entire procedure. Please see nursing notes for vital signs. Test dose was given through epidural catheter and negative prior to continuing to dose epidural or  start infusion. Warning signs of high block given to the patient including shortness of breath, tingling/numbness in hands, complete motor block, or any concerning symptoms with instructions to call for help. Patient was given instructions on fall risk and not to get out of bed. All questions and concerns addressed with instructions to call with any issues or inadequate analgesia.   Patient tolerated the insertion well without immediate complications.Reason for block:procedure for pain

## 2020-04-06 NOTE — Discharge Summary (Signed)
Obstetrical Discharge Summary  Patient Name: Donna Navarro DOB: February 02, 1986 MRN: [id number]  Date of Admission: 04/06/2020 Date of Delivery: 04/06/20 Delivered by: Heloise Ochoa CNM Date of Discharge: 04/07/2020  Primary OB: [facility]  [id number] last menstrual period was 07/15/2019 (exact date). EDC Estimated Date of Delivery: 04/20/20 Gestational Age at Delivery: [redacted]w[redacted]d   Antepartum complications:  1. Morbid obesity, BMI 49.3 2. Advanced maternal age, 34yo at delivery 3. 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 Umbilical/Ventral hernia: approx 15cm, 03/05/2020: seen by general surgery 5. History of Syphilis, treated last year 2020 at [facility] 6. Chronic Hypertension, started on meds by TJS at 17wks; Labetalol 100mg  PO BID, weekly NSTs at hospital.  7. Planning BTL, consent signed 03/15/20 8. A2GDM, dx 02/14/20, seen by [facility] 8/31,startedchecking CBG 03/15/20, reports all CBG above goal.Metformin started 03/25/20 500mg  qHS  Admitting Diagnosis:  Secondary Diagnosis: Patient Active Problem List   Diagnosis Date Noted  . Chronic hypertension during pregnancy, antepartum 03/15/2020  . Preterm uterine contractions in second trimester, antepartum 01/05/2020  . Normal vaginal delivery 12/15/2018  . Postpartum care following vaginal delivery 12/15/2018  . Incompetent cervix   . Pregnancy 12/12/2018  . Indication for care in labor and delivery, antepartum 12/12/2018  . Decreased fetal movement 12/12/2018  . Diarrhea 12/04/2018  . Pregnancy with abdominal cramping of lower quadrant, antepartum 12/03/2018  . Sciatica 11/02/2018  . 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 Obesity affecting pregnancy, antepartum, third trimester 08/09/2018  . Morbid obesity with BMI of 40.0-44.9, adult (HCC) 08/09/2018  . Cervical cerclage suture present, antepartum 06/28/2018  .  High-risk pregnancy supervision, unspecified trimester 05/17/2018  . Cervical incompetence 05/17/2018  . Hx of cerclage, currently pregnant 05/17/2018  . 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 Ventral hernia without obstruction or gangrene 04/30/2017  . Morbid obesity (HCC) 04/30/2017    Augmentation: AROM and Pitocin Complications: None Intrapartum complications/course: see delivery notes.  Date of Delivery: 04/06/20 Delivered by: 13/01/2017 CNM Delivery Type: spontaneous vaginal delivery Anesthesia: epidural Placenta: spontaneous Laceration: periurethral and labial abrasions Episiotomy: none Newborn Data: Live born female "Donna Navarro" Birth Weight:  7#14 APGAR: 9, 9  Newborn Delivery   Birth date/time: 04/06/2020 16:10:00 Delivery type: Vaginal, Spontaneous      Postpartum Procedures: none  Edinburgh:  Edinburgh Postnatal Depression Scale Screening Tool 04/07/2020 12/16/2018  I have been able to laugh and see the funny side of things. 0 0  I have looked forward with enjoyment to things. 2 0  I have blamed myself unnecessarily when things went wrong. 0 1  I have been anxious or worried for no good reason. 0 0  I have felt scared or panicky for no good reason. 0 0  Things have been getting on top of me. 1 1  I have been so unhappy that I have had difficulty sleeping. 1 0  I have felt sad or miserable. 1 0  I have been so unhappy that I have been crying. 0 0  The thought of harming myself has occurred to me. 0 0  Edinburgh Postnatal Depression Scale Total 5 2      Post partum course:  Patient had an uncomplicated postpartum course.  By time of discharge on PPD#1, her pain was controlled on oral pain medications; she had appropriate lochia and was ambulating, voiding without difficulty and tolerating regular diet.  She was deemed stable for discharge to home.  Discharge Physical Exam:  BP 123/73 (BP Location: Right Arm)   Pulse 76   Temp 97.7 F (36.5 C) (Oral)   Resp 18   Ht  5\' 7"  (1.702 m)   Wt (!) 146.1 kg   LMP 07/15/2019 (Exact Date)   SpO2 98%   Breastfeeding Unknown   BMI 50.43 kg/m   General: NAD CV: RRR Pulm: CTABL, nl effort ABD: s/nd/nt, fundus firm and below the umbilicus Lochia: small Perineum: well approximated/intact DVT Evaluation: LE non-ttp, no evidence of DVT on exam.  Hemoglobin  Date Value Ref Range Status  04/07/2020 9.8 (L) 12.0 - 15.0 g/dL Final  04/09/2020 40/98/1191 11.1 - 15.9 g/dL Final   HCT  Date Value Ref Range Status  04/07/2020 29.5 (L) 36 - 46 % Final   Hematocrit  Date Value Ref Range Status  10/25/2018 36.3 34.0 - 46.6 % Final     Disposition: stable, discharge to home. Baby Feeding: formula Baby Disposition: home with mom  Rh Immune globulin given: n/a Rubella vaccine given: immune Varicella vaccine given: immune Tdap vaccine given in AP or PP setting:  Flu vaccine given in AP or PP setting:   Contraception: desires BTL, planned at 6wks with gen surgery assist for hernia.   Prenatal Labs:  Blood type/Rh  A Pos  Antibody screen neg  Rubella Immune  Varicella Immune  RPR NR  HBsAg Neg  HIV NR  GC neg  Chlamydia neg  Genetic screening negative  1 hour GTT  150  3 hour GTT  [phone]  GBS  Pos      Plan:  BRANAE CRAIL was discharged to home in good condition. Follow-up appointment with delivering provider in 6 weeks.  Discharge Medications: Allergies as of 04/07/2020   No Known Allergies     Medication List    STOP taking these medications   calcium carbonate 500 MG chewable tablet Commonly known as: TUMS - dosed in mg elemental calcium     TAKE these medications   acetaminophen 325 MG tablet Commonly known as: Tylenol Take 2 tablets (650 mg total) by mouth every 4 (four) hours as needed (for pain scale < 4).   enoxaparin 40 MG/0.4ML injection Commonly known as: LOVENOX Inject 0.4 mLs (40 mg total) into the skin daily for 42 doses.   ferrous sulfate 325 (65 FE) MG  tablet Take 1 tablet (325 mg total) by mouth 2 (two) times daily with a meal. For anemia, take with Vitamin C   ibuprofen 600 MG tablet Commonly known as: ADVIL Take 1 tablet (600 mg total) by mouth every 6 (six) hours.   labetalol 100 MG tablet Commonly known as: NORMODYNE Take 1 tablet (100 mg total) by mouth 2 (two) times daily.   metFORMIN 500 MG tablet Commonly known as: Glucophage Take 1 tablet (500 mg total) by mouth at bedtime.   prenatal multivitamin Tabs tablet Take 1 tablet by mouth daily at 12 noon.   senna-docusate 8.6-50 MG tablet Commonly known as: Senokot-S Take 2 tablets by mouth daily. Start taking on: April 08, 2020        Follow-up Information    Kimisha Eunice, April 10, 2020, CNM Follow up in 2 week(s).   Specialty: Obstetrics and Gynecology Why: 2wk blood pressure check and then 6week routine Postpartum visit with Liletta insertion Contact information: [address] [place] [phone]        [facility] Follow up in 6 week(s).   Why: Call for general  surgery appt  Contact information: [address] [place] [address] [phone]               Signed:  [name], CNM 04/07/2020  6:54 PM

## 2020-04-06 NOTE — H&P (Signed)
OB History & Physical   History of Present Illness:  Chief Complaint: here for induction  HPI:  Donna Navarro is a 34 y.o. [id number] female at [redacted]w[redacted]d dated by LMP and c/w [place] at [redacted]w[redacted]d.  She presents to L&D for scheduled IOL due to [facility], GDMA2, and morbid obesity.  Active FM, having occasional BH ctx; denies VB or LOF.     Pregnancy Issues: 1. Morbid obesity, BMI 49.3 2. Advanced maternal age, 34yo at delivery 3. 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 Umbilical/Ventral hernia: approx 15cm, 03/05/2020: seen by general surgery 5. History of Syphilis, treated last year 2020 at [facility] 6. Chronic Hypertension, started on meds by TJS at 17wks; Labetalol 100mg  PO BID, weekly NSTs at hospital.   7. Planning BTL, consent signed 03/15/20 8. A1GDM, dx 02/14/20, seen by [facility] 8/31, started checking CBG  03/15/20, reports all CBG above goal.    Maternal Medical History:   Past Medical History:  Diagnosis Date  . GERD (gastroesophageal reflux disease)    with pregnancy-no meds  . Headache    migraines during pregnancy  . History of hiatal hernia    large per pt  . Hypertension    lost weight and pcp took pt off bp meds 7 months  . Incompetent cervix   . Irregular periods   . Morbid obesity (HCC) 04/30/2017  . Obesity affecting pregnancy in second trimester 08/09/2018  . Syphilis affecting pregnancy   . Ventral hernia without obstruction or gangrene 04/30/2017    Past Surgical History:  Procedure Laterality Date  . CERVICAL CERCLAGE N/A 06/17/2018   Procedure: CERCLAGE CERVICAL;  Surgeon: 06/19/2018, MD;  Location: [facility] ORS;  Service: Gynecology;  Laterality: N/A;x 3  . CERVICAL CERCLAGE N/A 10/24/2019   Procedure: CERCLAGE CERVICAL - MCDONALD - MERSILENE BAND;  Surgeon: 12/24/2019, MD;  Location: [facility] ORS;  Service: Gynecology;  Laterality: N/A;  . CHOLECYSTECTOMY      No Known  Allergies  Prior to Admission medications   Medication Sig Start Date End Date Taking? Authorizing Provider  calcium carbonate (TUMS - DOSED IN MG ELEMENTAL CALCIUM) 500 MG chewable tablet Chew 1 tablet by mouth as needed for indigestion or heartburn.    [provider]  labetalol (NORMODYNE) 100 MG tablet Take 1 tablet (100 mg total) by mouth 2 (two) times daily. 03/25/20   Aedan Geimer, 05/25/20, CNM  metFORMIN (GLUCOPHAGE) 500 MG tablet Take 1 tablet (500 mg total) by mouth at bedtime. 03/25/20 03/25/21  Zamara Cozad, 05/25/21, CNM  Prenatal Vit-Fe Fumarate-FA (PRENATAL MULTIVITAMIN) TABS tablet Take 1 tablet by mouth daily at 12 noon.    [provider]     Prenatal care site: [facility]  Social History: She  reports that she has never smoked. She has never used smokeless tobacco. She reports previous alcohol use. She reports that she does not use drugs.  Family History: family history includes Heart disease in her mother.   Review of Systems: A full review of systems was performed and negative except as noted in the HPI.     Physical Exam:  Vital Signs: BP 133/86   Pulse 83   Temp 97.8 F (36.6 C) (Oral)   Resp 15   Ht 5\' 7"  (1.702 m)   Wt (!) 146.1 kg   LMP 07/15/2019 (Exact Date)   BMI 50.43 kg/m  General: no acute distress.  HEENT: normocephalic, atraumatic Heart:  regular rate & rhythm.  No murmurs/rubs/gallops Lungs: clear to auscultation bilaterally, normal respiratory effort Abdomen: soft, gravid, non-tender; Approx 15cm soft, non-tender umbilical/ventral hernia.  EFW: 8lbs Pelvic:   External: Normal external female genitalia  Cervix: Dilation: 3.5 / Effacement (%): 50 / Station: -3    Extremities: non-tender, symmetric, No edema bilaterally.  DTRs: 2+  Neurologic: Alert & oriented x 3.    Results for orders placed or performed during the hospital encounter of 04/06/20 (from the past 24 hour(s))  CBC     Status: Abnormal   Collection Time:  04/06/20  8:37 AM  Result Value Ref Range   WBC 16.1 (H) 4.0 - 10.5 K/uL   RBC 4.67 3.87 - 5.11 MIL/uL   Hemoglobin 12.3 12.0 - 15.0 g/dL   HCT 10.1 36 - 46 %   MCV 81.8 80.0 - 100.0 fL   MCH 26.3 26.0 - 34.0 pg   MCHC 32.2 30.0 - 36.0 g/dL   RDW 75.1 02.5 - 85.2 %   Platelets 323 150 - 400 K/uL   nRBC 0.0 0.0 - 0.2 %  Type and screen [facility]     Status: None   Collection Time: 04/06/20  8:37 AM  Result Value Ref Range   ABO/RH(D) A POS    Antibody Screen NEG    Sample Expiration      04/09/2020,2359 Performed at [facility], [address] [place], [place] [address]   Comprehensive metabolic panel     Status: Abnormal   Collection Time: 04/06/20  8:37 AM  Result Value Ref Range   Sodium 135 135 - 145 mmol/L   Potassium 3.7 3.5 - 5.1 mmol/L   Chloride 106 98 - 111 mmol/L   CO2 19 (L) 22 - 32 mmol/L   Glucose, Bld 102 (H) 70 - 99 mg/dL   BUN 8 6 - 20 mg/dL   Creatinine, Ser 2.35 0.44 - 1.00 mg/dL   Calcium 9.1 8.9 - 36.1 mg/dL   Total Protein 6.9 6.5 - 8.1 g/dL   Albumin 2.9 (L) 3.5 - 5.0 g/dL   AST 15 15 - 41 U/L   ALT 8 0 - 44 U/L   Alkaline Phosphatase 139 (H) 38 - 126 U/L   Total Bilirubin 0.7 0.3 - 1.2 mg/dL   GFR, Estimated >44 >31 mL/min   Anion gap 10 5 - 15  Protein / creatinine ratio, urine     Status: None   Collection Time: 04/06/20  8:37 AM  Result Value Ref Range   Creatinine, Urine 293 mg/dL   Total Protein, Urine 17 mg/dL   Protein Creatinine Ratio 0.06 0.00 - 0.15 mg/mg[Cre]    Pertinent Results:  Prenatal Labs: Blood type/Rh  A Pos  Antibody screen neg  Rubella Immune  Varicella Immune  RPR NR  HBsAg Neg  HIV NR  GC neg  Chlamydia neg  Genetic screening negative  1 hour GTT  150  3 hour GTT  [id number]  GBS  Pos   FHT: 130bpm, mod variability, + accels, no decels; Intermittent tracing, very difficult to trace.  TOCO: Not tracing, pt reports cramping.   SVE:  Dilation: 3.5 / Effacement (%): 50  / Station: -3    Cephalic by leopolds and confirmed by [place].   No results found.  Assessment:  Donna Navarro is a 34 y.o. [phone] female at [redacted]w[redacted]d with IOL for CHTN, GDMA2 and obesity.   Plan:  1. Admit to Labor & Delivery; consents  reviewed and obtained - COVID negative on admit.  - CHTN- On Labetalol 100mg  PO BID, Am dose taken prior to admission.  - GDMA2: Metformin 1000mg  qHS, Last A1C 6.5 at [facility] 1 week ago.  - Ventral/Umbilical hernia present, pt to see Gen Surgery at 6wks PP  2. Fetal Well being  - Fetal Tracing: Cat I but intermittent due to challenges with continuous EFM due to maternal habitus and hernia.  - Group B Streptococcus ppx indicated: Positive - Presentation: cephalic confirmed by   3. Routine OB: - Prenatal labs reviewed, as above - Rh A Pos - CBC, T&S, RPR on admit - Clear fluids, IVF  4. Induction of Labor -  Contractions: external toco in place -  Pelvis proven to 7#12  -  Plan for induction with Pitocin -  Plan for continuous fetal monitoring, plan to place internal monitoring as soon as AROM possible.   -  Maternal pain control as desired; requesting regional anesthesia - Anticipate vaginal delivery  5. Post Partum Planning: - Infant feeding: breast - Contraception: desires BTL.  - Tdap/Flu: declined  [facility], CNM 04/06/20 12:23 PM

## 2020-04-06 NOTE — Anesthesia Preprocedure Evaluation (Signed)
Anesthesia Evaluation  Patient identified by MRN, date of birth, ID band Patient awake    Reviewed: Allergy & Precautions, H&P , NPO status , Patient's Chart, lab work & pertinent test results  Airway Mallampati: II   Neck ROM: full    Dental no notable dental hx.    Pulmonary neg pulmonary ROS,    Pulmonary exam normal        Cardiovascular hypertension, On Medications Normal cardiovascular exam     Neuro/Psych  Headaches,  Neuromuscular disease    GI/Hepatic hiatal hernia, GERD  Medicated and Controlled,  Endo/Other  Morbid obesity  Renal/GU      Musculoskeletal   Abdominal   Peds  Hematology   Anesthesia Other Findings   Reproductive/Obstetrics (+) Pregnancy                             Anesthesia Physical Anesthesia Plan  ASA: III  Anesthesia Plan: Epidural   Post-op Pain Management:    Induction:   PONV Risk Score and Plan:   Airway Management Planned:   Additional Equipment:   Intra-op Plan:   Post-operative Plan:   Informed Consent: I have reviewed the patients History and Physical, chart, labs and discussed the procedure including the risks, benefits and alternatives for the proposed anesthesia with the patient or authorized representative who has indicated his/her understanding and acceptance.     Dental Advisory Given  Plan Discussed with: Anesthesiologist and CRNA  Anesthesia Plan Comments:         Anesthesia Quick Evaluation

## 2020-04-06 NOTE — Progress Notes (Signed)
Provider at nurses station Lovenox order clarified for Postpartum to give Lovenox injection Subcutaneous 30mg  BID starting on 04/07/20 at 0600.

## 2020-04-07 LAB — CBC
HCT: 29.5 % — ABNORMAL LOW (ref 36.0–46.0)
Hemoglobin: 9.8 g/dL — ABNORMAL LOW (ref 12.0–15.0)
MCH: 26.8 pg (ref 26.0–34.0)
MCHC: 33.2 g/dL (ref 30.0–36.0)
MCV: 80.8 fL (ref 80.0–100.0)
Platelets: 280 10*3/uL (ref 150–400)
RBC: 3.65 MIL/uL — ABNORMAL LOW (ref 3.87–5.11)
RDW: 14.6 % (ref 11.5–15.5)
WBC: 21.1 10*3/uL — ABNORMAL HIGH (ref 4.0–10.5)
nRBC: 0 % (ref 0.0–0.2)

## 2020-04-07 LAB — GLUCOSE, CAPILLARY: Glucose-Capillary: 119 mg/dL — ABNORMAL HIGH (ref 70–99)

## 2020-04-07 MED ORDER — ACETAMINOPHEN 325 MG PO TABS: 650.0000 mg | ORAL_TABLET | ORAL | Status: AC | PRN

## 2020-04-07 MED ORDER — FERROUS SULFATE 325 (65 FE) MG PO TABS: 325.0000 mg | ORAL_TABLET | Freq: Two times a day (BID) | ORAL | 1 refills | Status: DC

## 2020-04-07 MED ORDER — ENOXAPARIN SODIUM 40 MG/0.4ML ~~LOC~~ SOLN: 40.0000 mg | SUBCUTANEOUS | 0 refills | Status: DC

## 2020-04-07 MED ORDER — IBUPROFEN 600 MG PO TABS: 600.0000 mg | ORAL_TABLET | Freq: Four times a day (QID) | ORAL | 0 refills | Status: DC

## 2020-04-07 MED ORDER — SENNOSIDES-DOCUSATE SODIUM 8.6-50 MG PO TABS: 2.0000 | ORAL_TABLET | ORAL | 0 refills | Status: DC

## 2020-04-07 NOTE — Lactation Note (Signed)
This note was copied from a baby's chart. Lactation Consultation Note  Patient Name: Boy Tashiba Timoney [id number] Date: 04/07/2020   Spoke with mom to confirm her decision to bottle feed formula.  Mom said she did not breast feed or pump with her other babies and had no desire to with this new delivery.  Hand out given on Infant Formula Preparation and reviewed preparing powdered infant formula safely, storage, cleaning and sanitizing bottles and feeding and preparation equipment and protecting against cronobacter infections.  Mom reports only having trouble with milk coming in with one of her other babies.  Discussed differences, prevention and treatment of full breasts, engorgement, plugged ducts and mastitis and when to seek further help from a physician.  Explained how to dry up her milk by wearing well-fitting support bra, using cold and/or cabbage leaves and no warmth, stimulation or extraction of milk.  Name and number written on white board and encouraged to call with any further questions, concerns or if assistance needed.  Maternal Data    Feeding Feeding Type: Bottle Fed - Formula  LATCH Score                   Interventions    Lactation Tools Discussed/Used     Consult Status      Louis Meckel 04/07/2020, 4:13 PM

## 2020-04-08 LAB — RPR
RPR Ser Ql: REACTIVE — AB
RPR Titer: 1:4 {titer}

## 2020-04-09 ENCOUNTER — Other Ambulatory Visit: Payer: Medicaid Other

## 2020-04-09 LAB — T.PALLIDUM AB, TOTAL: T Pallidum Abs: REACTIVE — AB

## 2020-04-09 NOTE — Anesthesia Postprocedure Evaluation (Signed)
Anesthesia Post Note  Patient: Donna Navarro  Procedure(s) Performed: AN AD HOC LABOR EPIDURAL  Patient location during evaluation: Mother Baby Anesthesia Type: Epidural Level of consciousness: awake and alert Pain management: pain level controlled Vital Signs Assessment: post-procedure vital signs reviewed and stable Respiratory status: spontaneous breathing, nonlabored ventilation and respiratory function stable Cardiovascular status: stable Postop Assessment: no headache, no backache and epidural receding Anesthetic complications: no   No complications documented.   Last Vitals: There were no vitals filed for this visit.  Last Pain: There were no vitals filed for this visit.               Yevette Edwards

## 2020-06-23 NOTE — L&D Delivery Note (Signed)
Delivery Note  First Stage: Labor onset: 1415 Augmentation: Cytotec, Pitocin.  Analgesia /Anesthesia intrapartum: Fentanyl x 3doses, Epidural AROM at 1530  Second Stage: Complete dilation at 1534 Onset of pushing at 1534 FHR second stage Variables with UCs.   Delivery of a viable female infant on 04/13/21 at 1540 by CNM; delivery of fetal head in LOP position with restitution to LOT. Loose nuchal cord reduced on perineum;  Anterior then posterior shoulders delivered easily with gentle downward traction. Baby placed on mom's chest, and attended to by peds.  Cord double clamped after cessation of pulsation, cut by FOB  Third Stage: Placenta delivered spontaneously intact with 3VC @ 1544 Placenta disposition: routine disposal Uterine tone Firm / bleeding scant  NO lacerations identified, intact perineum, vagina and cervix.   Anesthesia for repair: n/a Est. Blood Loss (mL): 100  Complications: none  Mom to postpartum.  Baby to Couplet care / Skin to Skin.  Newborn: Birth Weight: pending  Apgar Scores: 8/9 Feeding planned: bottle

## 2020-07-30 ENCOUNTER — Other Ambulatory Visit: Payer: Medicaid Other

## 2020-08-14 ENCOUNTER — Other Ambulatory Visit: Payer: Self-pay

## 2020-08-14 ENCOUNTER — Ambulatory Visit (LOCAL_COMMUNITY_HEALTH_CENTER): Payer: Medicaid Other

## 2020-08-14 VITALS — BP 148/80 | Ht 67.0 in | Wt 308.5 lb

## 2020-08-14 DIAGNOSIS — Z3201 Encounter for pregnancy test, result positive: Secondary | ICD-10-CM | POA: Diagnosis not present

## 2020-08-14 LAB — PREGNANCY, URINE: Preg Test, Ur: POSITIVE — AB

## 2020-08-14 MED ORDER — PRENATAL VITAMINS 28-0.8 MG PO TABS: 28.0000 mg | ORAL_TABLET | Freq: Every day | ORAL | 0 refills | Status: AC

## 2020-08-14 NOTE — Progress Notes (Signed)
Patient will seek care at [facility]. PNV given. Richmond Campbell, RN

## 2020-09-06 ENCOUNTER — Other Ambulatory Visit: Payer: Self-pay

## 2020-09-06 ENCOUNTER — Other Ambulatory Visit: Payer: Self-pay | Admitting: Family Medicine

## 2020-09-06 DIAGNOSIS — O10919 Unspecified pre-existing hypertension complicating pregnancy, unspecified trimester: Secondary | ICD-10-CM

## 2020-09-06 DIAGNOSIS — O99211 Obesity complicating pregnancy, first trimester: Secondary | ICD-10-CM

## 2020-09-24 ENCOUNTER — Other Ambulatory Visit: Payer: Medicaid Other

## 2020-09-25 LAB — OB RESULTS CONSOLE RPR: RPR: REACTIVE

## 2020-09-25 LAB — OB RESULTS CONSOLE RUBELLA ANTIBODY, IGM: Rubella: IMMUNE

## 2020-09-25 LAB — OB RESULTS CONSOLE HEPATITIS B SURFACE ANTIGEN: Hepatitis B Surface Ag: NEGATIVE

## 2020-09-25 LAB — OB RESULTS CONSOLE HIV ANTIBODY (ROUTINE TESTING): HIV: NONREACTIVE

## 2020-09-25 LAB — OB RESULTS CONSOLE GC/CHLAMYDIA
Chlamydia: NEGATIVE
Gonorrhea: NEGATIVE

## 2020-09-25 LAB — OB RESULTS CONSOLE VARICELLA ZOSTER ANTIBODY, IGG: Varicella: IMMUNE

## 2020-10-11 ENCOUNTER — Ambulatory Visit (HOSPITAL_BASED_OUTPATIENT_CLINIC_OR_DEPARTMENT_OTHER): Payer: Medicaid Other | Admitting: Maternal & Fetal Medicine

## 2020-10-11 ENCOUNTER — Other Ambulatory Visit: Payer: Self-pay

## 2020-10-11 ENCOUNTER — Ambulatory Visit: Payer: Medicaid Other | Attending: Maternal & Fetal Medicine

## 2020-10-11 ENCOUNTER — Ambulatory Visit (HOSPITAL_BASED_OUTPATIENT_CLINIC_OR_DEPARTMENT_OTHER): Payer: Medicaid Other

## 2020-10-11 DIAGNOSIS — Z3A12 12 weeks gestation of pregnancy: Secondary | ICD-10-CM

## 2020-10-11 DIAGNOSIS — N883 Incompetence of cervix uteri: Secondary | ICD-10-CM

## 2020-10-11 DIAGNOSIS — O10011 Pre-existing essential hypertension complicating pregnancy, first trimester: Secondary | ICD-10-CM | POA: Insufficient documentation

## 2020-10-11 DIAGNOSIS — Z6841 Body Mass Index (BMI) 40.0 and over, adult: Secondary | ICD-10-CM

## 2020-10-11 DIAGNOSIS — O3431 Maternal care for cervical incompetence, first trimester: Secondary | ICD-10-CM | POA: Insufficient documentation

## 2020-10-11 DIAGNOSIS — O09521 Supervision of elderly multigravida, first trimester: Secondary | ICD-10-CM | POA: Insufficient documentation

## 2020-10-11 DIAGNOSIS — Z7982 Long term (current) use of aspirin: Secondary | ICD-10-CM | POA: Diagnosis not present

## 2020-10-11 DIAGNOSIS — O10919 Unspecified pre-existing hypertension complicating pregnancy, unspecified trimester: Secondary | ICD-10-CM

## 2020-10-11 DIAGNOSIS — O10911 Unspecified pre-existing hypertension complicating pregnancy, first trimester: Secondary | ICD-10-CM | POA: Diagnosis not present

## 2020-10-11 DIAGNOSIS — O09291 Supervision of pregnancy with other poor reproductive or obstetric history, first trimester: Secondary | ICD-10-CM | POA: Diagnosis not present

## 2020-10-11 DIAGNOSIS — E669 Obesity, unspecified: Secondary | ICD-10-CM

## 2020-10-11 DIAGNOSIS — O99211 Obesity complicating pregnancy, first trimester: Secondary | ICD-10-CM | POA: Diagnosis not present

## 2020-10-11 NOTE — Progress Notes (Signed)
MFM Consultation  Date of Service: 10/11/20 Reason for Request: Maternal chronic hypertension Requesting provider: Margaretmary Eddy, CNM  Ms. Tejada is a 35 yo G8P4 who is here at 19 w 5 d with an EDD of 04/20/21 dated by LMP consistent with a 7 weeks exam.  She is seen regarding chronic hypertension at the request of Ms. Tami Lin,  Ms. Canion is doing well today without complaints. She has no s/sx of miscarriage.  Her pregnancy is complicated by the following:  1) Chronic hypertension on medication: Ms Chiasson has been diagnosed with chronic hypertension for several years. She is currently taking procardia 30 XL. She has a normal blood pressure today. She is also taking low dose ASA daily. Early preeclampsia labs were drawn and are normal including CBC, CMP and UPC. Lastly, her pregnancy history is not complicated by preeclampsia.  2) Cervical incompetence: Ms. Mcauley has a history of 20 week loss in 2005. She ws managed with a cerclage in her subsequent pregnancies and went to term. She is planning for a cerclage this pregnancy scheduled in the coming weeks.   3) Elevated BMI: Ms Stangler's BMI is 49 she is aware of her weight gain goals.  Vitals with BMI 10/11/2020 08/14/2020 04/07/2020  Height 5\' 7"  5\' 7"  -  Weight 318 lbs 308 lbs 8 oz -  BMI 49.79 48.31 -  Systolic 116 148  Diastolic 56 80 73  Pulse 74 - 76   CBC Latest Ref Rng & Units 04/07/2020 04/06/2020 11/19/2019  WBC 4.0 - 10.5 K/uL 21.1(H) 16.1(H) 19.5(H)  Hemoglobin 12.0 - 15.0 g/dL 04/08/2020) 11/21/2019 11.7(L)  Hematocrit 36.0 - 46.0 % 29.5(L) 38.2 35.8(L)  Platelets 150 - 400 K/uL 280 323 294   CMP Latest Ref Rng & Units 04/06/2020 11/19/2019 10/21/2019  Glucose 70 - 99 mg/dL 11/21/2019) 10/23/2019) 361(W)  BUN 6 - 20 mg/dL 8 7 8   Creatinine 0.44 - 1.00 mg/dL 431(V 400(Q  Sodium 135 - 145 mmol/L 135 136 135  Potassium 3.5 - 5.1 mmol/L 3.7 4.0 3.7  Chloride 98 - 111 mmol/L 106 106 105  CO2 22 - 32 mmol/L 19(L) 21(L) 25  Calcium  8.9 - 10.3 mg/dL 9.1 9.2 6.76)  Total Protein 6.5 - 8.1 g/dL 6.9 6.8 -  Total Bilirubin 0.3 - 1.2 mg/dL 0.7 0.5 -  Alkaline Phos 38 - 126 U/L 139(H) 63 -  AST 15 - 41 U/L 15 11(L) -  ALT 0 - 44 U/L 8 10 -    Past Medical History:  Diagnosis Date  . GERD (gastroesophageal reflux disease)    with pregnancy-no meds  . Headache    migraines during pregnancy  . History of hiatal hernia    large per pt  . Hypertension    lost weight and pcp took pt off bp meds 7 months  . Incompetent cervix   . Irregular periods   . Morbid obesity (HCC) 04/30/2017  . Obesity affecting pregnancy in second trimester 08/09/2018  . Syphilis affecting pregnancy   . Ventral hernia without obstruction or gangrene 04/30/2017   Past Surgical History:  Procedure Laterality Date  . CERVICAL CERCLAGE N/A 06/17/2018   Procedure: CERCLAGE CERVICAL;  Surgeon: 08/11/2018, MD;  Location: [facility] ORS;  Service: Gynecology;  Laterality: N/A;x 3  . CERVICAL CERCLAGE N/A 10/24/2019   Procedure: CERCLAGE CERVICAL - MCDONALD - MERSILENE BAND;  Surgeon: 06/19/2018, MD;  Location: [facility] ORS;  Service: Gynecology;  Laterality: N/A;  . CHOLECYSTECTOMY  Current Outpatient Medications on File Prior to Visit  Medication Sig Dispense Refill  . acetaminophen (TYLENOL) 325 MG tablet Take 2 tablets (650 mg total) by mouth every 4 (four) hours as needed (for pain scale < 4).    . aspirin 81 MG chewable tablet Chew 81 mg by mouth daily.    . calcium carbonate (TUMS - DOSED IN MG ELEMENTAL CALCIUM) 500 MG chewable tablet Chew 1 tablet by mouth daily.    . folic acid (FOLVITE) 400 MCG tablet Take 400 mcg by mouth daily. Pt unsure of dosage    . NIFEdipine (PROCARDIA-XL/NIFEDICAL-XL) 30 MG 24 hr tablet Take 30 mg by mouth daily.    . Prenatal Vit-Fe Fumarate-FA (PRENATAL VITAMINS) 28-0.8 MG TABS Take 28 mg by mouth daily. 90 tablet 0   No current facility-administered medications on file prior to visit.   OB History   Gravida Para Term Preterm AB Living  8 4 4  0 3 4  SAB IAB Ectopic Multiple Live Births  2     0 4    # Outcome Date GA Lbr Len/2nd Weight Sex Delivery Anes PTL Lv  8 Current           7 Term 04/06/20 [redacted]w[redacted]d / 00:17 3580 g M Vag-Spont EPI  LIV  6 Term 12/15/18 [redacted]w[redacted]d 04:14 / 00:26 3520 g F Vag-Spont EPI  LIV  5 SAB 07/25/17          4 Term 12/14/09 [redacted]w[redacted]d  2722 g F Vag-Spont   LIV     Complications: Cervical cerclage suture present  3 Term 05/16/05 [redacted]w[redacted]d  2268 g F Vag-Spont   LIV     Complications: Cervical cerclage suture present  2 AB 2005 [redacted]w[redacted]d   U Vag-Spont  Y FD     Complications: Incompetent cervix  1 SAB 2003            Obstetric Comments  History of cerclages with term pregnancies   No Known Allergies   Imaging: Normal first trimester anatomy with measurements consistent with dates. A detailed anatomy is precluded due to early gestational age.  Impression/Counseling:  1) Chronic Hypertension I discussed with Ms. Brockman that chronic hypertension in pregnancy is defined as elevated blood pressure prior to [redacted] weeks gestation. We discussed the complications associated with chronic hypertension including fetal growth restriction, placental abruption, super imposed preeclampsia, preterm delivery and stillbirth. I reassured her that given that her risk are low given that she has not had preeclampsia previously, however, they are above baseline. Secondly, we discussed continued used of daily low dose ASA for prevention of preeclampsia.  Fetal surveillance should be as previously documented serial growth exam with detailed exam at 20 weeks. Repeat growth every 4-6 weeks with weekly/ 2x weekly testing beginning at 32 weeks.   Delivery should be considered between 37-39 weeks pending maternal and fetal status.  Lastly, we discussed maintaining blood pressure below 140/90 mmHg and increasing procardia as necessary.   2) Prior preterm delivery Agree with cerclage placement in the  first trimester.  3) BMI >40  I encouraged TWG < 11-20 lbs   4) Advanced maternal age: Ms. Loyd has had normal cell free DNA. We discussed the decreased risk for aneuploidy given her normal result. However, we explained that she has the option for genetic counesling to discuss the results and option for carrier screening. She opted to meet with our genetic counselor Laural Benes wells.  Please see that report for details.  I spent 45 minutes  with > 50% in face to face counseling.  She was scheduled to return for a detailed examination at 20-22 weeks.  Novella Olive, MD

## 2020-10-11 NOTE — Progress Notes (Addendum)
Referring Provider:  [facility] Length of Consultation: 25 minutes  Donna Navarro was referred to Maternal Fetal Care at [facility] for genetic counseling because of advanced maternal age.  The patient will be 35 years old at the time of delivery.  This note summarizes the information we discussed.    We explained that the chance of a chromosome abnormality increases with maternal age.  Chromosomes and examples of chromosome problems were reviewed.  Humans typically have 46 chromosomes in each cell, with half passed through each sperm and egg.  Any change in the number or structure of chromosomes can increase the risk of problems in the physical and mental development of a pregnancy.   Based upon age of the patient and the current gestational age, the chance of any chromosome abnormality was 1 in 79. The chance of Down syndrome, the most common chromosome problem associated with maternal age, was 1 in 50.  The risk of chromosome problems is in addition to the 3% general population risk for birth defects and intellectual disabilities.  The greatest chance, of course, is that the baby would be born in good health.  We discussed the following prenatal screening and testing options for this pregnancy:  Cell free fetal DNA testing analyzes maternal blood to determine whether or not the baby may have Down syndrome, trisomy 76, or trisomy 16.  This test utilizes a maternal blood sample and DNA sequencing technology to isolate circulating cell free fetal DNA from maternal plasma.  The fetal DNA can then be analyzed for DNA sequences that are derived from the three most common chromosomes involved in aneuploidy, chromosomes 13, 18, and 21.  If the overall amount of DNA is greater than the expected level for any of these chromosomes, aneuploidy is suspected.  While we do not consider it a replacement for invasive testing and karyotype analysis, a negative result from this testing would be  reassuring, though not a guarantee of a normal chromosome complement for the baby.  An abnormal result is certainly suggestive of an abnormal chromosome complement, though we would still recommend CVS or amniocentesis to confirm any findings from this testing.  Donna Navarro had prior cell free fetal DNA testing in this pregnancy which was negative for Trisomy 13, 18 and 21.  Results were also low risk for sex chromosome aneuploidies and revealed the fetal gender to be female.  This result significantly reduces the chance for these conditions in the pregnancy, but is still considered a screening test.   First trimester screening, is another blood test which includes nuchal translucency ultrasound screen and first trimester maternal serum marker screening.  The nuchal translucency has approximately an 80% detection rate for Down syndrome and can be positive for other chromosome abnormalities as well as heart defects.  When combined with a maternal serum marker screening, the detection rate is up to 90% for Down syndrome and up to 97% for trisomy 18.   We do not recommend this testing after cell free fetal DNA testing  The chorionic villus sampling procedure is available for first trimester chromosome analysis.  This involves the withdrawal of a small amount of chorionic villi (tissue from the developing placenta).  Risk of pregnancy loss is estimated to be approximately 1 in 200 to 1 in 100 (0.5 to 1%).  There is approximately a 1% (1 in 100) chance that the CVS chromosome results will be unclear.  Chorionic villi cannot be tested for neural tube defects.  Maternal serum marker screening, a blood test that measures pregnancy proteins, can provide risk assessments for Down syndrome, trisomy 18, and open neural tube defects (spina bifida, anencephaly). Because it does not directly examine the fetus, it cannot positively diagnose or rule out these problems. The detection rate is approximately 75% for Down syndrome,  70% for trisomy 18 and 80% of open neural tube defects. If prior chromosome screening has been performed, then AFP only is recommended to test for open neural tube defects alone.  Targeted ultrasound uses high frequency sound waves to create an image of the developing fetus.  An ultrasound is often recommended as a routine means of evaluating the pregnancy.  It is also used to screen for fetal anatomy problems (for example, a heart defect) that might be suggestive of a chromosomal or other abnormality.   Amniocentesis involves the removal of a small amount of amniotic fluid from the sac surrounding the fetus with the use of a thin needle inserted through the maternal abdomen and uterus.  Ultrasound guidance is used throughout the procedure.  Fetal cells from amniotic fluid are directly evaluated and > 99.5% of chromosome problems and > 98% of open neural tube defects can be detected. This procedure is generally performed after the 15th week of pregnancy.  The main risks to this procedure include complications leading to miscarriage in less than 1 in 200 cases (0.5%).  Cystic Fibrosis and Spinal Muscular Atrophy (SMA) screening were also discussed with the patient. Both conditions are recessive, which means that both parents must be carriers in order to have a child with the disease.  Cystic fibrosis (CF) is one of the most common genetic conditions in persons of Caucasian ancestry.  This condition occurs in approximately 1 in 2,500 Caucasian persons and results in thickened secretions in the lungs, digestive, and reproductive systems.  For a baby to be at risk for having CF, both of the parents must be carriers for this condition.  Approximately 1 in 26 Caucasian persons is a carrier for CF.  Current carrier testing looks for the most common mutations in the gene for CF and can detect approximately 90% of carriers in the Caucasian population.  This means that the carrier screening can greatly reduce, but  cannot eliminate, the chance for an individual to have a child with CF.  If an individual is found to be a carrier for CF, then carrier testing would be available for the partner. As part of [place]'s newborn screening profile, all babies born in the state of [place] will have a two-tier screening process.  Specimens are first tested to determine the concentration of immunoreactive trypsinogen (IRT).  The top 5% of specimens with the highest IRT values then undergo DNA testing using a panel of over 40 common CF mutations. SMA is a neurodegenerative disorder that leads to atrophy of skeletal muscle and overall weakness.  This condition is also more prevalent in the Caucasian population, with 1 in 40-1 in 60 persons being a carrier and 1 in 6,000-1 in 10,000 children being affected.  There are multiple forms of the disease, with some causing death in infancy to other forms with survival into adulthood.  The genetics of SMA is complex, but carrier screening can detect up to 95% of carriers in the Caucasian population.  Similar to CF, a negative result can greatly reduce, but cannot eliminate, the chance to have a child with SMA.  Hemoglobinopathy screening was also offered to the patient. Of note,  her MCV is normal.  We obtained a detailed family history and pregnancy history.  The patient reported that this is her 8th pregnancy.  She has two healthy daughters from prior relationships, ages 41 and 15 years.  She had two early miscarriages and a 24 weeks stillborn son with her first partner.  The stillborn baby was due to incompetent cervix.  In the four pregnancies since that loss, she has had a cerclage placed.  She and her current partner, Cajavous, have a 61 month old son and a 22 year old daughter.  The patient reported possible asthma and lung problems in this daughter, but she is growing and developing appropriately.  Also in the family, Donna Navarro has several maternal relatives with hypertension  and diabetes.  Her parents both passed away in their 35s from heart attacks.  We reviewed the likely multifactorial inheritance of diabetes and cardiovascular disease, with inherited as well as lifestyle factors being involved. The remainder of the family history is unremarkable for birth defects, developmental delays, recurrent pregnancy loss or known chromosome abnormalities.  Donna Navarro reported no complications or exposures in this pregnancy that would be expected to increase the risk for birth defects.  She reported taking a baby aspirin and medication for hypertension prescribed by her OB.  There was no reported use of alcohol, tobacco or recreational drugs.  After consideration of the options, Donna Navarro elected to proceed with an ultrasound only and to declined carrier screening for CF, SMA and hemoglobinopathies.  An ultrasound was performed at the time of the visit.  The gestational age was consistent with 12 weeks .  Fetal anatomy could not be assessed due to early gestational age.  Please refer to the ultrasound report for details of that study.  Donna Navarro was encouraged to call with questions or concerns.  We can be contacted at [phone].  Plan of Care: Marland Kitchen Declined carrier screening for CF, SMA and Hemoglobinopathies.   . Given normal NIPS, the patient does not desire invasive prenatal testing by CVS or amnio. . Scheduled to return to clinic in 8 weeks for fetal anatomy ultrasound . msAFP only to be drawn by OB if desired for spina bifida screening . See MFM note for further recommendations regarding hypertension, elevated BMI and incompetent cervix.   Cherly Anderson, MS, CGC

## 2020-10-22 ENCOUNTER — Ambulatory Visit (LOCAL_COMMUNITY_HEALTH_CENTER): Payer: Medicaid Other

## 2020-10-22 ENCOUNTER — Other Ambulatory Visit: Payer: Self-pay

## 2020-10-22 DIAGNOSIS — Z111 Encounter for screening for respiratory tuberculosis: Secondary | ICD-10-CM

## 2020-10-22 NOTE — H&P (Signed)
Ms. Kooi is a 35 y.o. female here for placement of a cervical cerclage  .pt here for high risk pregnancy .  H/o incompetent cervix   with first pregnancy PTD at 20 weeks  Subsequent to this she had a mcdonald cerclage placed 2006+2011+2020+2021 and 4 SVD between 38-[redacted] weeks gestation .  .she was going to get a BTL  But conceived . Husband now has a vasectomy   She contracted COVID -19  03/22 Other issues that place her high risk Morbid obesity , CHTN ,AMA, close interval pregnancy Early 1 hr Glucola wnl   +IUP on 6 week u/s , scheduled for repeat 10/12/20  Past Medical History:  has a past medical history of Hypertension.  Past Surgical History:  has a past surgical history that includes Laparoscopic cholecystectomy; Cervical Cerclage Vaginal (06/17/2018); and cervical cerclage vaginal. Family History: family history includes Diabetes in her maternal grandmother; High blood pressure (Hypertension) in her mother; Myocardial Infarction (Heart attack) in her father and mother; No Known Problems in her maternal grandfather, paternal grandfather, and paternal grandmother. Social History:  reports that she has never smoked. She has never used smokeless tobacco. She reports previous alcohol use. She reports that she does not use drugs. OB/GYN History:          OB History    Gravida  8   Para  5   Term  4   Preterm  1   AB  2   Living  4     SAB  2   IAB      Ectopic      Molar      Multiple      Live Births  4          Allergies: has No Known Allergies. Medications:  Current Outpatient Medications:  .  aspirin 81 MG EC tablet, Take 81 mg by mouth once daily, Disp: , Rfl:  .  folic acid (FOLVITE) 1 MG tablet, Take 1 tablet (1 mg total) by mouth once daily, Disp: 30 tablet, Rfl: 11 .  prenatal vit-iron fum-folic ac (PRENAVITE) tablet, Take 1 tablet by mouth once daily, Disp: , Rfl:  .  acetaminophen (TYLENOL) 325 MG tablet, Take by mouth (Patient not taking:  Reported on 10/10/2020), Disp: , Rfl:  .  calcium carbonate (TUMS) 200 mg calcium (500 mg) chewable tablet, Take by mouth (Patient not taking: Reported on 10/10/2020), Disp: , Rfl:  .  metFORMIN (GLUCOPHAGE) 500 MG tablet, Take by mouth (Patient not taking: No sig reported), Disp: , Rfl:  .  niFEdipine (PROCARDIA-XL) 30 MG (OSM) XL tablet, Take 1 tablet (30 mg total) by mouth once daily, Disp: 30 tablet, Rfl: 11  Review of Systems: General:                      No fatigue or weight loss Eyes:                           No vision changes Ears:                            No hearing difficulty Respiratory:                No cough or shortness of breath Pulmonary:                  No asthma or shortness of breath  Cardiovascular:           No chest pain, palpitations, dyspnea on exertion Gastrointestinal:          No abdominal bloating, chronic diarrhea, constipations, masses, pain or hematochezia Genitourinary:             No hematuria, dysuria, abnormal vaginal discharge, pelvic pain, Menometrorrhagia Lymphatic:                   No swollen lymph nodes Musculoskeletal:         No muscle weakness Neurologic:                  No extremity weakness, syncope, seizure disorder Psychiatric:                  No history of depression, delusions or suicidal/homicidal ideation    Exam:      Vitals:   10/10/20 0909  BP: 138/82    Body mass index is 49.48 kg/m.  WDWN  black female in NAD   Lungs: CTA  CV : RRR without murmur     Impression:   The primary encounter diagnosis was Need for influenza vaccination. Diagnoses of Cervical incompetence during pregnancy in third trimester, Chronic hypertension affecting pregnancy, and Obesity affecting pregnancy in first trimester were also pertinent to this visit.    Plan:  CHTN : start Procardia 30 mg xl , exercise and weight gain limitation of 10-15#. Start ASA 81 mg daily  Incompetent cervix : place Mcdonald cerclage at 16 weeks  11/02/20 Genetic screen [id number] - nl ., repeat u/s [facility] 4/21 Covid infection in pregnancy  Start Makena 17P weekly at 16 weeks     Jennell Corner MD

## 2020-10-25 ENCOUNTER — Ambulatory Visit (LOCAL_COMMUNITY_HEALTH_CENTER): Payer: Medicaid Other

## 2020-10-25 ENCOUNTER — Other Ambulatory Visit: Payer: Self-pay

## 2020-10-25 DIAGNOSIS — Z111 Encounter for screening for respiratory tuberculosis: Secondary | ICD-10-CM

## 2020-10-25 LAB — TB SKIN TEST
Induration: 0 mm
TB Skin Test: NEGATIVE

## 2020-10-29 ENCOUNTER — Other Ambulatory Visit
Admission: RE | Admit: 2020-10-29 | Discharge: 2020-10-29 | Disposition: A | Discharge status: Home / Self Care | Payer: Medicaid Other | Source: Ambulatory Visit | Attending: Obstetrics and Gynecology | Admitting: Obstetrics and Gynecology

## 2020-10-29 ENCOUNTER — Other Ambulatory Visit: Payer: Self-pay

## 2020-10-29 NOTE — Patient Instructions (Signed)
Your procedure is scheduled on: Friday Nov 02, 2020. Report to Day Surgery inside [facility] 2nd floor (stop by admissions desk first before getting on elevator). To find out your arrival time please call [phone] between 1PM - 3PM on Thursday Nov 01, 2020.  Remember: Instructions that are not followed completely may result in serious medical risk,  up to and including death, or upon the discretion of your surgeon and anesthesiologist your  surgery may need to be rescheduled.     _X__ 1. Do not eat food after midnight the night before your procedure.                 No chewing gum or hard candies. You may drink clear liquids up to 2 hours                 before you are scheduled to arrive for your surgery- DO not drink clear                 liquids within 2 hours of the start of your surgery.                 Clear Liquids include:  water, apple juice without pulp, clear Gatorade, G2 or                  Gatorade Zero (avoid Red/Purple/Blue), Black Coffee or Tea (Do not add                 anything to coffee or tea).  __X__2.   Complete the "Ensure Clear Pre-surgery Clear Carbohydrate Drink" provided to you, 2 hours before arrival. **If you are diabetic you will be provided with an alternative drink, Gatorade Zero or G2.  __X__3.  On the morning of surgery brush your teeth with toothpaste and water, you                may rinse your mouth with mouthwash if you wish.  Do not swallow any toothpaste of mouthwash.     _X__ 4.  No Alcohol for 24 hours before or after surgery.   _X__ 5.  Do Not Smoke or use e-cigarettes For 24 Hours Prior to Your Surgery.                 Do not use any chewable tobacco products for at least 6 hours prior to                 Surgery.  _X__  6.  Do not use any recreational drugs (marijuana, cocaine, heroin, ecstasy, MDMA or other)                For at least one week prior to your surgery.  Combination of these drugs with anesthesia                 May have life threatening results.  __X__ 7.  Notify your doctor if there is any change in your medical condition      (cold, fever, infections).     Do not wear jewelry, make-up, hairpins, clips or nail polish. Do not wear lotions, powders, or perfumes. You may wear deodorant. Do not shave 48 hours prior to surgery. Men may shave face and neck. Do not bring valuables to the hospital.    [facility] is not responsible for any belongings or valuables.  Contacts, dentures or bridgework may not be worn into surgery. Leave your suitcase in the car. After surgery  it may be brought to your room. For patients admitted to the hospital, discharge time is determined by your treatment team.   Patients discharged the day of surgery will not be allowed to drive home.   Make arrangements for someone to be with you for the first 24 hours of your Same Day Discharge.   __X__ Take these medicines the morning of surgery with A SIP OF WATER:    1. NIFEdipine (PROCARDIA-XL/NIFEDICAL-XL) 30 MG  2.   3.   4.  5.  6.  ____ Fleet Enema (as directed)   ____ Use CHG Soap (or wipes) as directed  ____ Use Benzoyl Peroxide Gel as instructed  ____ Use inhalers on the day of surgery  ____ Stop metformin 2 days prior to surgery    ____ Take 1/2 of usual insulin dose the night before surgery. No insulin the morning          of surgery.   ____ Call your PCP, cardiologist, or Pulmonologist if taking Coumadin/Plavix/aspirin and ask when to stop before your surgery.   __X__ One Week prior to surgery- Stop Anti-inflammatories such as Ibuprofen, Aleve, Advil, Motrin, meloxicam (MOBIC), diclofenac, etodolac, ketorolac, Toradol, Daypro, piroxicam, Goody's or BC powders. OK TO USE TYLENOL IF NEEDED   __X__ Stop supplements until after surgery.    ____ Bring C-Pap to the hospital.    If you have any questions regarding your pre-procedure instructions,  Please call Pre-admit Testing at  [phone].

## 2020-10-30 ENCOUNTER — Other Ambulatory Visit
Admission: RE | Admit: 2020-10-30 | Discharge: 2020-10-30 | Disposition: A | Discharge status: Home / Self Care | Payer: Medicaid Other | Source: Ambulatory Visit | Attending: Obstetrics and Gynecology | Admitting: Obstetrics and Gynecology

## 2020-10-30 DIAGNOSIS — Z01812 Encounter for preprocedural laboratory examination: Secondary | ICD-10-CM | POA: Diagnosis not present

## 2020-10-30 LAB — TYPE AND SCREEN
ABO/RH(D): A POS
Antibody Screen: NEGATIVE
Extend sample reason: UNDETERMINED

## 2020-10-30 LAB — CBC
HCT: 34.3 % — ABNORMAL LOW (ref 36.0–46.0)
Hemoglobin: 11.1 g/dL — ABNORMAL LOW (ref 12.0–15.0)
MCH: 27.5 pg (ref 26.0–34.0)
MCHC: 32.4 g/dL (ref 30.0–36.0)
MCV: 84.9 fL (ref 80.0–100.0)
Platelets: 293 10*3/uL (ref 150–400)
RBC: 4.04 MIL/uL (ref 3.87–5.11)
RDW: 16.1 % — ABNORMAL HIGH (ref 11.5–15.5)
WBC: 14.8 10*3/uL — ABNORMAL HIGH (ref 4.0–10.5)
nRBC: 0 % (ref 0.0–0.2)

## 2020-10-30 LAB — BASIC METABOLIC PANEL
Anion gap: 7 (ref 5–15)
BUN: 8 mg/dL (ref 6–20)
CO2: 23 mmol/L (ref 22–32)
Calcium: 8.8 mg/dL — ABNORMAL LOW (ref 8.9–10.3)
Chloride: 106 mmol/L (ref 98–111)
Creatinine, Ser: 0.57 mg/dL (ref 0.44–1.00)
GFR, Estimated: >60 mL/min (ref >60)
Glucose, Bld: 102 mg/dL — ABNORMAL HIGH (ref 70–99)
Potassium: 3.3 mmol/L — ABNORMAL LOW (ref 3.5–5.1)
Sodium: 136 mmol/L (ref 135–145)

## 2020-10-31 ENCOUNTER — Other Ambulatory Visit: Payer: Medicaid Other

## 2020-11-01 MED ORDER — ORAL CARE MOUTH RINSE: 15.0000 mL | Freq: Once | OROMUCOSAL | Status: AC

## 2020-11-01 MED ORDER — GABAPENTIN 300 MG PO CAPS
300.0000 mg | ORAL_CAPSULE | ORAL | Status: AC
  Administered 2020-11-02: 300 mg via ORAL

## 2020-11-01 MED ORDER — CHLORHEXIDINE GLUCONATE 0.12 % MT SOLN: 15.0000 mL | Freq: Once | OROMUCOSAL | Status: AC

## 2020-11-01 MED ORDER — FAMOTIDINE 20 MG PO TABS
20.0000 mg | ORAL_TABLET | Freq: Once | ORAL | Status: AC
  Administered 2020-11-02: 20 mg via ORAL

## 2020-11-01 MED ORDER — LACTATED RINGERS IV SOLN: INTRAVENOUS | Status: DC

## 2020-11-01 MED ORDER — CEFAZOLIN IN SODIUM CHLORIDE 3-0.9 GM/100ML-% IV SOLN
3.0000 g | Freq: Once | INTRAVENOUS | Status: AC
  Administered 2020-11-02: 3 g via INTRAVENOUS
  Filled 2020-11-01: qty 100

## 2020-11-01 MED ORDER — POVIDONE-IODINE 10 % EX SWAB: 2.0000 "application " | Freq: Once | CUTANEOUS | Status: DC

## 2020-11-01 MED ORDER — ACETAMINOPHEN 500 MG PO TABS: 1000.0000 mg | ORAL_TABLET | ORAL | Status: AC

## 2020-11-02 ENCOUNTER — Encounter
Admission: RE | Disposition: A | Discharge status: Home / Self Care | Payer: Self-pay | Source: Ambulatory Visit | Attending: Obstetrics and Gynecology

## 2020-11-02 ENCOUNTER — Ambulatory Visit: Payer: Medicaid Other | Admitting: Registered Nurse

## 2020-11-02 ENCOUNTER — Encounter: Payer: Self-pay | Admitting: Obstetrics and Gynecology

## 2020-11-02 ENCOUNTER — Other Ambulatory Visit: Payer: Self-pay

## 2020-11-02 ENCOUNTER — Ambulatory Visit
Admission: RE | Admit: 2020-11-02 | Discharge: 2020-11-02 | Disposition: A | Discharge status: Home / Self Care | Payer: Medicaid Other | Source: Ambulatory Visit | Attending: Obstetrics and Gynecology | Admitting: Obstetrics and Gynecology

## 2020-11-02 DIAGNOSIS — O3432 Maternal care for cervical incompetence, second trimester: Secondary | ICD-10-CM | POA: Insufficient documentation

## 2020-11-02 DIAGNOSIS — Z7982 Long term (current) use of aspirin: Secondary | ICD-10-CM | POA: Insufficient documentation

## 2020-11-02 DIAGNOSIS — Z9049 Acquired absence of other specified parts of digestive tract: Secondary | ICD-10-CM | POA: Diagnosis not present

## 2020-11-02 DIAGNOSIS — Z7984 Long term (current) use of oral hypoglycemic drugs: Secondary | ICD-10-CM | POA: Insufficient documentation

## 2020-11-02 DIAGNOSIS — O99212 Obesity complicating pregnancy, second trimester: Secondary | ICD-10-CM | POA: Diagnosis not present

## 2020-11-02 DIAGNOSIS — Z8616 Personal history of COVID-19: Secondary | ICD-10-CM | POA: Insufficient documentation

## 2020-11-02 DIAGNOSIS — Z3A16 16 weeks gestation of pregnancy: Secondary | ICD-10-CM | POA: Insufficient documentation

## 2020-11-02 DIAGNOSIS — Z79899 Other long term (current) drug therapy: Secondary | ICD-10-CM | POA: Insufficient documentation

## 2020-11-02 HISTORY — PX: CERVICAL CERCLAGE: SHX1329

## 2020-11-02 SURGERY — CERCLAGE, CERVIX, VAGINAL APPROACH
Anesthesia: Spinal

## 2020-11-02 MED ORDER — FENTANYL CITRATE (PF) 100 MCG/2ML IJ SOLN: 25.0000 ug | INTRAMUSCULAR | Status: DC | PRN

## 2020-11-02 MED ORDER — PROPOFOL 10 MG/ML IV BOLUS
INTRAVENOUS | Status: AC
  Filled 2020-11-02: qty 20

## 2020-11-02 MED ORDER — BUPIVACAINE IN DEXTROSE 0.75-8.25 % IT SOLN
INTRATHECAL | Status: DC | PRN
  Administered 2020-11-02: 1 mL via INTRATHECAL

## 2020-11-02 MED ORDER — ACETAMINOPHEN 500 MG PO TABS
ORAL_TABLET | ORAL | Status: AC
  Administered 2020-11-02: 1000 mg via ORAL
  Filled 2020-11-02: qty 2

## 2020-11-02 MED ORDER — FAMOTIDINE 20 MG PO TABS
ORAL_TABLET | ORAL | Status: AC
  Filled 2020-11-02: qty 1

## 2020-11-02 MED ORDER — GABAPENTIN 300 MG PO CAPS
ORAL_CAPSULE | ORAL | Status: AC
  Filled 2020-11-02: qty 1

## 2020-11-02 MED ORDER — CHLORHEXIDINE GLUCONATE 0.12 % MT SOLN
OROMUCOSAL | Status: AC
  Administered 2020-11-02: 15 mL via OROMUCOSAL
  Filled 2020-11-02: qty 15

## 2020-11-02 MED ORDER — ONDANSETRON HCL 4 MG/2ML IJ SOLN: 4.0000 mg | Freq: Once | INTRAMUSCULAR | Status: DC | PRN

## 2020-11-02 SURGICAL SUPPLY — 20 items
CATH ROBINSON RED A/P 16FR (CATHETERS) ×2 IMPLANT
COVER WAND RF STERILE (DRAPES) ×2 IMPLANT
DRAPE UNDER BUTTOCK W/FLU (DRAPES) ×2 IMPLANT
ELECT REM PT RETURN 9FT ADLT (ELECTROSURGICAL) ×2
ELECTRODE REM PT RTRN 9FT ADLT (ELECTROSURGICAL) ×1 IMPLANT
GLOVE SURG SYN 8.0 (GLOVE) ×6 IMPLANT
GLOVE SURG SYN 8.0 PF PI (GLOVE) ×1 IMPLANT
GOWN STRL REUS W/ TWL LRG LVL3 (GOWN DISPOSABLE) ×1 IMPLANT
GOWN STRL REUS W/ TWL XL LVL3 (GOWN DISPOSABLE) ×1 IMPLANT
GOWN STRL REUS W/TWL LRG LVL3 (GOWN DISPOSABLE) ×2
GOWN STRL REUS W/TWL XL LVL3 (GOWN DISPOSABLE) ×2
KIT TURNOVER CYSTO (KITS) ×2 IMPLANT
LABEL OR SOLS (LABEL) ×2 IMPLANT
MANIFOLD NEPTUNE II (INSTRUMENTS) ×2 IMPLANT
NS IRRIG 500ML POUR BTL (IV SOLUTION) ×2 IMPLANT
PACK BASIN MINOR ARMC (MISCELLANEOUS) ×2 IMPLANT
PAD OB MATERNITY 4.3X12.25 (PERSONAL CARE ITEMS) ×2 IMPLANT
PAD PREP 24X41 OB/GYN DISP (PERSONAL CARE ITEMS) ×2 IMPLANT
SURGILUBE 2OZ TUBE FLIPTOP (MISCELLANEOUS) ×2 IMPLANT
TAPE MERSILENE 5MM 36 OS-8 WHT (SUTURE) ×2 IMPLANT

## 2020-11-02 NOTE — Progress Notes (Signed)
Regional anesthesia completely resolved. Full sensation returned. Able to ambulate to bathroom and voiding without difficulty.

## 2020-11-02 NOTE — Anesthesia Preprocedure Evaluation (Signed)
Anesthesia Evaluation  Patient identified by MRN, date of birth, ID band Patient awake    Reviewed: Allergy & Precautions, H&P , NPO status , Patient's Chart, lab work & pertinent test results, reviewed documented beta blocker date and time   Airway Mallampati: II  TM Distance: >3 FB Neck ROM: full    Dental no notable dental hx. (+) Teeth Intact   Pulmonary neg pulmonary ROS,    Pulmonary exam normal breath sounds clear to auscultation       Cardiovascular Exercise Tolerance: Good hypertension, On Medications negative cardio ROS Normal cardiovascular exam Rhythm:regular Rate:Normal     Neuro/Psych  Headaches,  Neuromuscular disease negative psych ROS   GI/Hepatic Neg liver ROS, hiatal hernia, GERD  Medicated,  Endo/Other  Morbid obesity  Renal/GU negative Renal ROS  negative genitourinary   Musculoskeletal   Abdominal   Peds  Hematology negative hematology ROS (+)   Anesthesia Other Findings Past Medical History: No date: GERD (gastroesophageal reflux disease)     Comment:  with pregnancy-no meds No date: Headache     Comment:  migraines during pregnancy No date: History of hiatal hernia     Comment:  large per pt No date: Hypertension     Comment:  lost weight and pcp took pt off bp meds 7 months No date: Incompetent cervix No date: Irregular periods 04/30/2017: Morbid obesity (HCC) 08/09/2018: Obesity affecting pregnancy in second trimester No date: Syphilis affecting pregnancy 04/30/2017: Ventral hernia without obstruction or gangrene Past Surgical History: 06/17/2018: CERVICAL CERCLAGE; N/A     Comment:  Procedure: CERCLAGE CERVICAL;  Surgeon: Natale Milch, MD;  Location: [facility] ORS;  Service:               Gynecology;  Laterality: N/A;x 3 10/24/2019: CERVICAL CERCLAGE; N/A     Comment:  Procedure: CERCLAGE CERVICAL - MCDONALD - MERSILENE               BAND;  Surgeon: Suzy Bouchard, MD;  Location:               [facility] ORS;  Service: Gynecology;  Laterality: N/A; No date: CHOLECYSTECTOMY BMI    Body Mass Index: 49.49 kg/m     Reproductive/Obstetrics                             Anesthesia Physical Anesthesia Plan  ASA: III  Anesthesia Plan: Spinal   Post-op Pain Management:    Induction:   PONV Risk Score and Plan:   Airway Management Planned:   Additional Equipment:   Intra-op Plan:   Post-operative Plan:   Informed Consent: I have reviewed the patients History and Physical, chart, labs and discussed the procedure including the risks, benefits and alternatives for the proposed anesthesia with the patient or authorized representative who has indicated his/her understanding and acceptance.     Dental Advisory Given  Plan Discussed with: CRNA  Anesthesia Plan Comments:         Anesthesia Quick Evaluation

## 2020-11-02 NOTE — Transfer of Care (Signed)
Immediate Anesthesia Transfer of Care Note  Patient: Donna Navarro  Procedure(s) Performed: CERCLAGE CERVICAL (N/A )  Patient Location: PACU  Anesthesia Type:Spinal  Level of Consciousness: awake and alert   Airway & Oxygen Therapy: Patient Spontanous Breathing  Post-op Assessment: Report given to RN and Post -op Vital signs reviewed and stable  Post vital signs: Reviewed and stable  Last Vitals:  Vitals Value Taken Time  BP 144/65 11/02/20 1112  Temp    Pulse 68 11/02/20 1115  Resp 18 11/02/20 1115  SpO2 100 % 11/02/20 1115  Vitals shown include unvalidated device data.  Last Pain:  Vitals:   11/02/20 0834  TempSrc: Oral  PainSc: 0-No pain         Complications: No complications documented.

## 2020-11-02 NOTE — Anesthesia Procedure Notes (Signed)
Spinal  Patient location during procedure: OR Start time: 11/02/2020 10:24 AM End time: 11/02/2020 10:39 AM Reason for block: surgical anesthesia Staffing Performed: anesthesiologist  Anesthesiologist: Yevette Edwards, MD Preanesthetic Checklist Completed: patient identified, IV checked, site marked, risks and benefits discussed, surgical consent, monitors and equipment checked, pre-op evaluation and timeout performed Spinal Block Patient position: sitting Prep: DuraPrep Patient monitoring: heart rate, cardiac monitor, continuous pulse ox and blood pressure Approach: midline Location: L3-4 Injection technique: single-shot Needle Needle type: Pencan  Needle gauge: 24 G Needle length: 9 cm Assessment Sensory level: T10 Events: CSF return

## 2020-11-02 NOTE — Discharge Instructions (Signed)

## 2020-11-02 NOTE — Progress Notes (Signed)
Pt [redacted] weeks EGA , FHR 160 . NPO . Ready for Mcdonald cervical cerclage given h/o incompetent cervix Labs reviewed . Proceed

## 2020-11-02 NOTE — Brief Op Note (Signed)
11/02/2020  11:09 AM  PATIENT:  Donna Navarro  35 y.o. female  PRE-OPERATIVE DIAGNOSIS:  incompetent cervix  POST-OPERATIVE DIAGNOSIS:  incompetent cervix  PROCEDURE:  Procedure(s): CERCLAGE CERVICAL (N/A)- Mcdonald 5 mm band   SURGEON:  Surgeon(s) and Role:    * Saveah Bahar, Ihor [place], MD - Primary  PHYSICIAN ASSISTANT: cst  ASSISTANTS: none   ANESTHESIA:   spinal  EBL:  5 mL IOF 300 cc  BLOOD ADMINISTERED:none  DRAINS: none   LOCAL MEDICATIONS USED:  NONE  SPECIMEN:  No Specimen  DISPOSITION OF SPECIMEN:  N/A  COUNTS:  YES  TOURNIQUET:  * No tourniquets in log *  DICTATION: .Other Dictation: Dictation Number verbal  PLAN OF CARE: Discharge to home after PACU  PATIENT DISPOSITION:  PACU - hemodynamically stable.   Delay start of Pharmacological VTE agent (>24hrs) due to surgical blood loss or risk of bleeding: not applicable

## 2020-11-03 NOTE — Anesthesia Postprocedure Evaluation (Signed)
Anesthesia Post Note  Patient: Donna Navarro  Procedure(s) Performed: CERCLAGE CERVICAL (N/A )  Patient location during evaluation: PACU Anesthesia Type: Spinal Level of consciousness: oriented and awake and alert Pain management: pain level controlled Vital Signs Assessment: post-procedure vital signs reviewed and stable Respiratory status: spontaneous breathing, respiratory function stable and patient connected to nasal cannula oxygen Cardiovascular status: blood pressure returned to baseline and stable Postop Assessment: no headache, no backache and no apparent nausea or vomiting Anesthetic complications: no   No complications documented.   Last Vitals:  Vitals:   11/02/20 1218 11/02/20 1405  BP:  (!) 146/70  Pulse:  74  Resp:  19  Temp: (!) 36.1 C   SpO2:  100%    Last Pain:  Vitals:   11/02/20 1218  TempSrc: Temporal  PainSc:                  Yevette Edwards

## 2020-11-03 NOTE — Op Note (Signed)
NAME: Donna Navarro, Donna Navarro MEDICAL RECORD NO: [id number] ACCOUNT NO: [id number] DATE OF BIRTH: [birth year] FACILITY: [facility] LOCATION: [facility] PHYSICIAN: Suzy Bouchard, MD  Operative Report   DATE OF PROCEDURE: 11/02/2020  PREOPERATIVE DIAGNOSIS:   1.  16 weeks estimated gestational age. 2.  History of incompetent cervix.  POSTOPERATIVE DIAGNOSIS:   1.  16 weeks estimated gestational age. 2.  History of incompetent cervix.  PROCEDURE:  McDonald cervical cerclage, 5 mm Mersilene band.  SURGEON:  Suzy Bouchard, MD.  ANESTHESIA:  Spinal.    INDICATION:  A 35 year old gravida 8, para 5, the patient with a long history of cervical incompetence and has had 4 previously placed cervical cerclage that allowed her to deliver at term.  DESCRIPTION OF PROCEDURE:  After adequate spinal anesthesia, the patient was placed in dorsal supine position.  The patient was prepped and draped in normal sterile fashion.  Timeout was performed.  The patient did receive 3 grams IV Ancef for surgical  prophylaxis prior to the procedure.  Bladder was catheterized yielding 50 mL clear urine.  A weighted speculum was placed in the vagina and 5 mm Mersilene band was placed 2.5 cm proximal to the ectocervix.  This was placed in a pursestring fashion with  entrance at 12 o'clock, exiting at 2 o'clock, entering at 4 o'clock, exiting at 5 o'clock, entering at 7 o'clock exiting at 8 o'clock, and entering at 10 o'clock exiting at 11 o'clock and the band was then cinched firmly with multiple knots located at 12  o'clock position.  Good application of the tissues.  Good hemostasis was noted.  No complications.  ESTIMATED BLOOD LOSS:  5 mL  INTRAOPERATIVE FLUIDS:  300 mL  URINE OUTPUT:  50 mL The patient was taken to recovery room in good condition.   [email or web address] D: 11/02/2020 11:54:02 am T: 11/03/2020 5:18:00 am  JOB: [id number]/ [id number]

## 2020-11-04 ENCOUNTER — Encounter: Payer: Self-pay | Admitting: Obstetrics and Gynecology

## 2020-11-26 ENCOUNTER — Other Ambulatory Visit: Payer: Self-pay | Admitting: Certified Nurse Midwife

## 2020-11-26 DIAGNOSIS — O10919 Unspecified pre-existing hypertension complicating pregnancy, unspecified trimester: Secondary | ICD-10-CM

## 2020-11-26 DIAGNOSIS — O99212 Obesity complicating pregnancy, second trimester: Secondary | ICD-10-CM

## 2020-11-26 DIAGNOSIS — O09522 Supervision of elderly multigravida, second trimester: Secondary | ICD-10-CM

## 2020-12-06 ENCOUNTER — Ambulatory Visit: Payer: Medicaid Other | Attending: Maternal & Fetal Medicine

## 2020-12-06 ENCOUNTER — Other Ambulatory Visit: Payer: Self-pay | Admitting: Certified Nurse Midwife

## 2020-12-06 ENCOUNTER — Other Ambulatory Visit: Payer: Self-pay

## 2020-12-06 DIAGNOSIS — O10919 Unspecified pre-existing hypertension complicating pregnancy, unspecified trimester: Secondary | ICD-10-CM

## 2020-12-06 DIAGNOSIS — O09522 Supervision of elderly multigravida, second trimester: Secondary | ICD-10-CM | POA: Insufficient documentation

## 2020-12-06 DIAGNOSIS — Z3A20 20 weeks gestation of pregnancy: Secondary | ICD-10-CM | POA: Diagnosis not present

## 2020-12-06 DIAGNOSIS — O3432 Maternal care for cervical incompetence, second trimester: Secondary | ICD-10-CM | POA: Insufficient documentation

## 2020-12-06 DIAGNOSIS — O99212 Obesity complicating pregnancy, second trimester: Secondary | ICD-10-CM | POA: Insufficient documentation

## 2020-12-06 DIAGNOSIS — O10012 Pre-existing essential hypertension complicating pregnancy, second trimester: Secondary | ICD-10-CM | POA: Diagnosis not present

## 2020-12-06 DIAGNOSIS — E669 Obesity, unspecified: Secondary | ICD-10-CM | POA: Insufficient documentation

## 2020-12-06 DIAGNOSIS — O10912 Unspecified pre-existing hypertension complicating pregnancy, second trimester: Secondary | ICD-10-CM | POA: Diagnosis not present

## 2020-12-14 ENCOUNTER — Observation Stay
Admission: EM | Admit: 2020-12-14 | Discharge: 2020-12-14 | Disposition: A | Discharge status: Home / Self Care | Payer: Medicaid Other

## 2020-12-14 ENCOUNTER — Encounter: Payer: Self-pay | Admitting: Obstetrics and Gynecology

## 2020-12-14 ENCOUNTER — Other Ambulatory Visit: Payer: Self-pay

## 2020-12-14 ENCOUNTER — Observation Stay: Payer: Medicaid Other

## 2020-12-14 DIAGNOSIS — O99212 Obesity complicating pregnancy, second trimester: Secondary | ICD-10-CM | POA: Insufficient documentation

## 2020-12-14 DIAGNOSIS — Z7982 Long term (current) use of aspirin: Secondary | ICD-10-CM | POA: Diagnosis not present

## 2020-12-14 DIAGNOSIS — E668 Other obesity: Secondary | ICD-10-CM | POA: Diagnosis not present

## 2020-12-14 DIAGNOSIS — O10012 Pre-existing essential hypertension complicating pregnancy, second trimester: Secondary | ICD-10-CM | POA: Diagnosis not present

## 2020-12-14 DIAGNOSIS — O26899 Other specified pregnancy related conditions, unspecified trimester: Secondary | ICD-10-CM

## 2020-12-14 DIAGNOSIS — Z8616 Personal history of COVID-19: Secondary | ICD-10-CM | POA: Diagnosis not present

## 2020-12-14 DIAGNOSIS — O2692 Pregnancy related conditions, unspecified, second trimester: Secondary | ICD-10-CM | POA: Insufficient documentation

## 2020-12-14 DIAGNOSIS — Z3A21 21 weeks gestation of pregnancy: Secondary | ICD-10-CM | POA: Insufficient documentation

## 2020-12-14 DIAGNOSIS — N9489 Other specified conditions associated with female genital organs and menstrual cycle: Secondary | ICD-10-CM | POA: Insufficient documentation

## 2020-12-14 DIAGNOSIS — O09512 Supervision of elderly primigravida, second trimester: Principal | ICD-10-CM | POA: Insufficient documentation

## 2020-12-14 DIAGNOSIS — O3432 Maternal care for cervical incompetence, second trimester: Secondary | ICD-10-CM

## 2020-12-14 LAB — URINALYSIS, COMPLETE (UACMP) WITH MICROSCOPIC
Bilirubin Urine: NEGATIVE
Glucose, UA: NEGATIVE mg/dL
Hgb urine dipstick: NEGATIVE
Ketones, ur: NEGATIVE mg/dL
Leukocytes,Ua: NEGATIVE
Nitrite: NEGATIVE
Protein, ur: NEGATIVE mg/dL
Specific Gravity, Urine: 1.025 (ref 1.005–1.030)
pH: 7 (ref 5.0–8.0)

## 2020-12-14 LAB — WET PREP, GENITAL
Clue Cells Wet Prep HPF POC: NONE SEEN
Sperm: NONE SEEN
Trich, Wet Prep: NONE SEEN
Yeast Wet Prep HPF POC: NONE SEEN

## 2020-12-14 MED ORDER — ACETAMINOPHEN 500 MG PO TABS: 1000.0000 mg | ORAL_TABLET | Freq: Four times a day (QID) | ORAL | Status: DC | PRN

## 2020-12-14 MED ORDER — CALCIUM CARBONATE ANTACID 500 MG PO CHEW: 2.0000 | CHEWABLE_TABLET | ORAL | Status: DC | PRN

## 2020-12-14 NOTE — OB Triage Note (Addendum)
Pt. presents to Labor & Delivery due to cramping since approximately 2pm yesterday, 6/23. Pt. states from 2-9pm yesterday, she experienced lower abdominal pain, rated 6/10, that she described as a cramping and pulling sensation. She took 500mg  of Tylenol at 4pm, and the pain slowly subsided by 9pm. Upon waking this morning, the pain had returned at the same intensity and type as yesterday. 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 She denies leaking of fluid, vaginal bleeding, or obvious contractions. She reports positive FM, though she states it is somewhat decreased today. FHT 145bpm. Vital signs WNL.  Urine sent for UA and culture. Pt. to Ultrasound for cervical length measurement. Will perform wet prep and continuous Toco upon return from Ultrasound.  Vaginal wet prep collected and sent to Lab. Continuous Toco applied. Will continue to monitor.

## 2020-12-14 NOTE — Discharge Summary (Signed)
Donna Navarro is a 35 y.o. female. She is at [redacted]w[redacted]d gestation. Patient's last menstrual period was 07/14/2020 (exact date). Estimated Date of Delivery: 04/20/21  Prenatal care site: [facility]  Chief complaint: uterine cramping   HPI: Donna Navarro presents to L&D with complaints of uterine cramping that started yesterday.  Reports that she noticed period-like cramping at work yesterday.    Factors complicating pregnancy: Obesity in pregnancy  Chronic HTN in pregnancy  History of incompetent cervix  AMA Hernia  Covid--19 in pregnancy  RPR reactive - chronic  Close interval pregnancies   S: Resting comfortably. Reports mild cramping, no VB.no LOF,  Active fetal movement.   Maternal Medical History:  Past Medical Hx:  has a past medical history of GERD (gastroesophageal reflux disease), Headache, History of hiatal hernia, Hypertension, Incompetent cervix, Irregular periods, Morbid obesity (HCC) (04/30/2017), Obesity affecting pregnancy in second trimester (08/09/2018), Syphilis affecting pregnancy, and Ventral hernia without obstruction or gangrene (04/30/2017).    Past Surgical Hx:  has a past surgical history that includes Cholecystectomy; Cervical cerclage (N/A, 06/17/2018); Cervical cerclage (N/A, 10/24/2019); and Cervical cerclage (N/A, 11/02/2020).   No Known Allergies   Prior to Admission medications   Medication Sig Start Date End Date Taking? Authorizing Provider  acetaminophen (TYLENOL) 325 MG tablet Take 2 tablets (650 mg total) by mouth every 4 (four) hours as needed (for pain scale < 4). 04/07/20  Yes McVey, Prudencio Pair, CNM  aspirin 81 MG chewable tablet Chew 81 mg by mouth daily.   Yes [provider]  calcium carbonate (TUMS - DOSED IN MG ELEMENTAL CALCIUM) 500 MG chewable tablet Chew 1 tablet by mouth daily as needed for indigestion or heartburn.   Yes [provider]  folic acid (FOLVITE) 1 MG tablet Take 1 mg by mouth daily. Pt unsure of dosage    Yes [provider]  NIFEdipine (PROCARDIA-XL/NIFEDICAL-XL) 30 MG 24 hr tablet Take 60 mg by mouth daily. Pt taking 60 mg per day now   Yes [provider]    Social History: She  reports that she has never smoked. She has never used smokeless tobacco. She reports previous alcohol use. She reports that she does not use drugs.  Family History: family history includes Heart disease in her mother. , no history of gyn cancers  Review of Systems: A full review of systems was performed and negative except as noted in the HPI.    O:  BP 128/79 (BP Location: Left Arm)   Pulse 85   Temp 98.5 F (36.9 C) (Oral)   Resp 15   LMP 07/14/2020 (Exact Date)  Results for orders placed or performed during the hospital encounter of 12/14/20 (from the past 48 hour(s))  Wet prep, genital   Collection Time: 12/14/20 11:20 AM   Specimen: Vaginal  Result Value Ref Range   Yeast Wet Prep HPF POC NONE SEEN NONE SEEN   Trich, Wet Prep NONE SEEN NONE SEEN   Clue Cells Wet Prep HPF POC NONE SEEN NONE SEEN   WBC, Wet Prep HPF POC MODERATE (A) NONE SEEN   Sperm NONE SEEN   Urinalysis, Complete w Microscopic Urine, Clean Catch   Collection Time: 12/14/20 11:20 AM  Result Value Ref Range   Color, Urine YELLOW (A) YELLOW   APPearance HAZY (A) CLEAR   Specific Gravity, Urine 1.025 1.005 - 1.030   pH 7.0 5.0 - 8.0   Glucose, UA NEGATIVE NEGATIVE mg/dL   Hgb urine dipstick NEGATIVE NEGATIVE  Bilirubin Urine NEGATIVE NEGATIVE   Ketones, ur NEGATIVE NEGATIVE mg/dL   Protein, ur NEGATIVE NEGATIVE mg/dL   Nitrite NEGATIVE NEGATIVE   Leukocytes,Ua NEGATIVE NEGATIVE   RBC / HPF 0-5 0 - 5 RBC/hpf   WBC, UA 0-5 0 - 5 WBC/hpf   Bacteria, UA RARE (A) NONE SEEN   Squamous Epithelial / LPF 11-20 0 - 5   Mucus PRESENT       Constitutional: NAD, AAOx3  HE/ENT: extraocular movements grossly intact, moist mucous membranes CV: RRR PULM: nl respiratory effort, CTABL Abd: gravid, non-tender,  non-distended, soft  Ext: Non-tender, Nonedmeatous Psych: mood appropriate, speech normal Pelvic : deferred  Fetal Monitor: Doppler: 145 bpm Toco: quiet   [place]: CLINICAL DATA:  Pregnancy.  Cramping.  Cervical cerclage   EXAM: LIMITED OBSTETRIC ULTRASOUND   COMPARISON:  12/06/2020   FINDINGS: Number of Fetuses: 1 Heart Rate:  130 bpm Movement: Yes Presentation: Transverse-head maternal left Placental Location: Posterior Previa: No Amniotic Fluid (Subjective):  Within normal limits. Maximum vertical pocket: 5.8 cm   FL: 3.8 cm 22 w  0 d   MATERNAL FINDINGS:   Cervix:  Closed.  3.4 cm.   Uterus/Adnexae: No abnormality visualized.   IMPRESSION: 1. Single live intrauterine gestation in transverse presentation. 2. Cervical length of 3.4 cm.  Adequate amniotic fluid.  Assessment: 35 y.o. [redacted]w[redacted]d here for antenatal surveillance during pregnancy.  Principle diagnosis: uterine cramping in pregnancy   Diagnoses of Cervical cerclage suture present in second trimester and Cramping affecting pregnancy, antepartum were pertinent to this visit.   Plan: Labor: not present.  Fetal Doppler reassuring Reassuring cervical length today - 3.4cm, cerclage intact Wet prep an UA negative for infections  Pelvic rest until next prenatal visit  Work note given to excuse from work this weekend  PTL precautions reviewed  D/c home stable, precautions reviewed, follow-up as scheduled.   ----- Margaretmary Eddy, CNM Certified Nurse Midwife [place]  [facility]

## 2020-12-15 LAB — URINE CULTURE: Culture: <10000 — AB

## 2021-01-15 ENCOUNTER — Other Ambulatory Visit: Payer: Self-pay | Admitting: Maternal & Fetal Medicine

## 2021-01-15 DIAGNOSIS — O3432 Maternal care for cervical incompetence, second trimester: Secondary | ICD-10-CM

## 2021-01-15 DIAGNOSIS — O09292 Supervision of pregnancy with other poor reproductive or obstetric history, second trimester: Secondary | ICD-10-CM

## 2021-01-15 DIAGNOSIS — O09522 Supervision of elderly multigravida, second trimester: Secondary | ICD-10-CM

## 2021-01-15 DIAGNOSIS — O10919 Unspecified pre-existing hypertension complicating pregnancy, unspecified trimester: Secondary | ICD-10-CM

## 2021-01-15 DIAGNOSIS — O99212 Obesity complicating pregnancy, second trimester: Secondary | ICD-10-CM

## 2021-01-17 ENCOUNTER — Other Ambulatory Visit: Payer: Self-pay

## 2021-01-17 ENCOUNTER — Ambulatory Visit: Payer: Medicaid Other | Attending: Obstetrics and Gynecology

## 2021-01-17 DIAGNOSIS — O09892 Supervision of other high risk pregnancies, second trimester: Secondary | ICD-10-CM | POA: Insufficient documentation

## 2021-01-17 DIAGNOSIS — O09522 Supervision of elderly multigravida, second trimester: Secondary | ICD-10-CM | POA: Insufficient documentation

## 2021-01-17 DIAGNOSIS — O3432 Maternal care for cervical incompetence, second trimester: Secondary | ICD-10-CM

## 2021-01-17 DIAGNOSIS — O10912 Unspecified pre-existing hypertension complicating pregnancy, second trimester: Secondary | ICD-10-CM

## 2021-01-17 DIAGNOSIS — O10919 Unspecified pre-existing hypertension complicating pregnancy, unspecified trimester: Secondary | ICD-10-CM

## 2021-01-17 DIAGNOSIS — Z3A26 26 weeks gestation of pregnancy: Secondary | ICD-10-CM | POA: Diagnosis not present

## 2021-01-17 DIAGNOSIS — O99212 Obesity complicating pregnancy, second trimester: Secondary | ICD-10-CM | POA: Diagnosis not present

## 2021-01-17 DIAGNOSIS — O09292 Supervision of pregnancy with other poor reproductive or obstetric history, second trimester: Secondary | ICD-10-CM | POA: Diagnosis not present

## 2021-01-27 ENCOUNTER — Encounter: Payer: Self-pay | Admitting: Obstetrics and Gynecology

## 2021-01-27 ENCOUNTER — Other Ambulatory Visit: Payer: Self-pay

## 2021-01-27 ENCOUNTER — Observation Stay
Admission: EM | Admit: 2021-01-27 | Discharge: 2021-01-27 | Disposition: A | Discharge status: Home / Self Care | Payer: Medicaid Other | Attending: Obstetrics and Gynecology | Admitting: Obstetrics and Gynecology

## 2021-01-27 DIAGNOSIS — Z3A28 28 weeks gestation of pregnancy: Secondary | ICD-10-CM | POA: Insufficient documentation

## 2021-01-27 DIAGNOSIS — O26893 Other specified pregnancy related conditions, third trimester: Secondary | ICD-10-CM | POA: Diagnosis not present

## 2021-01-27 DIAGNOSIS — O99213 Obesity complicating pregnancy, third trimester: Secondary | ICD-10-CM | POA: Diagnosis not present

## 2021-01-27 DIAGNOSIS — Z7982 Long term (current) use of aspirin: Secondary | ICD-10-CM | POA: Diagnosis not present

## 2021-01-27 DIAGNOSIS — E669 Obesity, unspecified: Secondary | ICD-10-CM | POA: Insufficient documentation

## 2021-01-27 DIAGNOSIS — R102 Pelvic and perineal pain: Secondary | ICD-10-CM | POA: Insufficient documentation

## 2021-01-27 DIAGNOSIS — Z8616 Personal history of COVID-19: Secondary | ICD-10-CM | POA: Insufficient documentation

## 2021-01-27 DIAGNOSIS — O10013 Pre-existing essential hypertension complicating pregnancy, third trimester: Secondary | ICD-10-CM | POA: Diagnosis not present

## 2021-01-27 DIAGNOSIS — O09513 Supervision of elderly primigravida, third trimester: Secondary | ICD-10-CM | POA: Diagnosis present

## 2021-01-27 LAB — URINALYSIS, COMPLETE (UACMP) WITH MICROSCOPIC
Bilirubin Urine: NEGATIVE
Glucose, UA: NEGATIVE mg/dL
Hgb urine dipstick: NEGATIVE
Ketones, ur: NEGATIVE mg/dL
Nitrite: NEGATIVE
Protein, ur: NEGATIVE mg/dL
Specific Gravity, Urine: 1.025 (ref 1.005–1.030)
pH: 7 (ref 5.0–8.0)

## 2021-01-27 MED ORDER — CYCLOBENZAPRINE HCL 10 MG PO TABS: 10.0000 mg | ORAL_TABLET | Freq: Three times a day (TID) | ORAL | 0 refills | Status: DC | PRN

## 2021-01-27 MED ORDER — ACETAMINOPHEN 500 MG PO TABS: 1000.0000 mg | ORAL_TABLET | Freq: Once | ORAL | Status: AC

## 2021-01-27 MED ORDER — CYCLOBENZAPRINE HCL 10 MG PO TABS
10.0000 mg | ORAL_TABLET | Freq: Three times a day (TID) | ORAL | Status: DC | PRN
  Administered 2021-01-27: 10 mg via ORAL
  Filled 2021-01-27 (×3): qty 1

## 2021-01-27 MED ORDER — ACETAMINOPHEN 500 MG PO TABS
ORAL_TABLET | ORAL | Status: AC
  Administered 2021-01-27: 1000 mg via ORAL
  Filled 2021-01-27: qty 2

## 2021-01-27 NOTE — Discharge Summary (Signed)
Donna Navarro is a 35 y.o. female. She is at [redacted]w[redacted]d gestation. Patient's last menstrual period was 07/14/2020 (exact date). Estimated Date of Delivery: 04/20/21  Prenatal care site: [facility]  Chief complaint: right lower pelvic pain that wraps around to her lower back. Started after work about 2d ago. Nausea associated with pain. Feels like a pulled muscle. Declines contractions, vaginal discharge.    Factors complicating pregnancy: Obesity in pregnancy  Chronic HTN in pregnancy  History of incompetent cervix  AMA Hernia  Covid--19 in pregnancy  RPR reactive - chronic  Close interval pregnancies   S: Resting comfortably. Reports mild cramping, no VB.no LOF,  Active fetal movement.   Maternal Medical History:  Past Medical Hx:  has a past medical history of GERD (gastroesophageal reflux disease), Headache, History of hiatal hernia, Hypertension, Incompetent cervix, Irregular periods, Morbid obesity (HCC) (04/30/2017), Obesity affecting pregnancy in second trimester (08/09/2018), Syphilis affecting pregnancy, and Ventral hernia without obstruction or gangrene (04/30/2017).    Past Surgical Hx:  has a past surgical history that includes Cholecystectomy; Cervical cerclage (N/A, 06/17/2018); Cervical cerclage (N/A, 10/24/2019); and Cervical cerclage (N/A, 11/02/2020).   No Known Allergies   Prior to Admission medications   Medication Sig Start Date End Date Taking? Authorizing Provider  acetaminophen (TYLENOL) 325 MG tablet Take 2 tablets (650 mg total) by mouth every 4 (four) hours as needed (for pain scale < 4). 04/07/20  Yes Allante Whitmire, Prudencio Pair, CNM  aspirin 81 MG chewable tablet Chew 81 mg by mouth daily.   Yes [provider]  calcium carbonate (TUMS - DOSED IN MG ELEMENTAL CALCIUM) 500 MG chewable tablet Chew 1 tablet by mouth daily as needed for indigestion or heartburn.   Yes [provider]  folic acid (FOLVITE) 1 MG tablet Take 1 mg by mouth daily. Pt  unsure of dosage   Yes [provider]  NIFEdipine (PROCARDIA-XL/NIFEDICAL-XL) 30 MG 24 hr tablet Take 60 mg by mouth daily. Pt taking 60 mg per day now   Yes [provider]    Social History: She  reports that she has never smoked. She has never used smokeless tobacco. She reports previous alcohol use. She reports that she does not use drugs.  Family History: family history includes Heart disease in her mother.   Review of Systems: A full review of systems was performed and negative except as noted in the HPI.    O:  BP 129/69 (BP Location: Left Arm)   Pulse 91   Temp 98.7 F (37.1 C) (Oral)   Resp 20   LMP 07/14/2020 (Exact Date)  Results for orders placed or performed during the hospital encounter of 01/27/21 (from the past 48 hour(s))  Urinalysis, Complete w Microscopic Urine, Clean Catch   Collection Time: 01/27/21  5:43 PM  Result Value Ref Range   Color, Urine YELLOW (A) YELLOW   APPearance HAZY (A) CLEAR   Specific Gravity, Urine 1.025 1.005 - 1.030   pH 7.0 5.0 - 8.0   Glucose, UA NEGATIVE NEGATIVE mg/dL   Hgb urine dipstick NEGATIVE NEGATIVE   Bilirubin Urine NEGATIVE NEGATIVE   Ketones, ur NEGATIVE NEGATIVE mg/dL   Protein, ur NEGATIVE NEGATIVE mg/dL   Nitrite NEGATIVE NEGATIVE   Leukocytes,Ua MODERATE (A) NEGATIVE   RBC / HPF 0-5 0 - 5 RBC/hpf   WBC, UA 11-20 0 - 5 WBC/hpf   Bacteria, UA RARE (A) NONE SEEN   Squamous Epithelial / LPF 6-10 0 - 5   Mucus  PRESENT        Constitutional: NAD, AAOx3  HE/ENT: extraocular movements grossly intact, moist mucous membranes CV: RRR PULM: nl respiratory effort, CTABL Abd: gravid, non-tender, non-distended, soft  Ext: Non-tender, Nonedematous Psych: mood appropriate, speech normal Pelvic : deferred  Fetal Monitor: EFM: 150bpm, mod variability, + accels, no decels Toco: no UCs    Assessment: 35 y.o. [redacted]w[redacted]d here for antenatal surveillance during pregnancy.  Principle diagnosis: muscle strain in  preg.    Plan: Labor: not present.  Fetal tracing reactive NST, cat I UA with mod leuk, WBCs >squamous epithelial, urine culture added on.  Advised support belt for work, given Rx for [employer] and tylenol.  Pelvic rest until next prenatal visit  Work note given to excuse from work this weekend  PTL precautions reviewed  D/c home stable, precautions reviewed, follow-up as scheduled.    [name], CNM  Certified Nurse Midwife [place]  [facility]

## 2021-01-27 NOTE — OB Triage Note (Addendum)
Pt. reports to Labor & Delivery due to complaints of lower, right abdominal pain ongoing since yesterday. She rates the pain as an 8/10 and describes it as pulling or tugging in nature; it radiates to her lower, right back. The pt. has also experienced ongoing nausea with the pain. This is outside the norm for her this pregnancy. She has a very physical job as a Lawyer as well as having several very small children in the home, therefore she is concerned that she may have pulled a muscle on that side of her body. The pt. was crying while describing her last two days in pain. She reports positive fetal movement; denies vaginal bleeding or LOF. External [place] and Toco applied. Initial FHR  155bpm. All vital signs WNL.  Urinalysis collected and sent per order from Heloise Ochoa, CNM. Will continue to monitor.  Pt.discharged home with significant other after receiving 1000mg  acetaminophen and 10mg  flexeril for pain relief. Pt. instructed to pick up prescription at confirmed pharmacy tomorrow morning and to follow-up with her OB provider as previously scheduled. Pt. verbalized understanding and has no further concerns or questions at this time.

## 2021-01-27 NOTE — Discharge Instructions (Signed)
Get a support belt as soon as you can.

## 2021-01-29 LAB — URINE CULTURE

## 2021-02-11 ENCOUNTER — Other Ambulatory Visit: Payer: Self-pay | Admitting: Maternal & Fetal Medicine

## 2021-02-11 DIAGNOSIS — O09899 Supervision of other high risk pregnancies, unspecified trimester: Secondary | ICD-10-CM

## 2021-02-11 DIAGNOSIS — O09293 Supervision of pregnancy with other poor reproductive or obstetric history, third trimester: Secondary | ICD-10-CM

## 2021-02-11 DIAGNOSIS — O99213 Obesity complicating pregnancy, third trimester: Secondary | ICD-10-CM

## 2021-02-11 DIAGNOSIS — O09893 Supervision of other high risk pregnancies, third trimester: Secondary | ICD-10-CM

## 2021-02-11 DIAGNOSIS — O10919 Unspecified pre-existing hypertension complicating pregnancy, unspecified trimester: Secondary | ICD-10-CM

## 2021-02-11 DIAGNOSIS — Z8759 Personal history of other complications of pregnancy, childbirth and the puerperium: Secondary | ICD-10-CM

## 2021-02-11 DIAGNOSIS — O3433 Maternal care for cervical incompetence, third trimester: Secondary | ICD-10-CM

## 2021-02-14 ENCOUNTER — Ambulatory Visit: Payer: Medicaid Other | Attending: Maternal & Fetal Medicine

## 2021-02-14 ENCOUNTER — Other Ambulatory Visit: Payer: Self-pay

## 2021-02-14 DIAGNOSIS — O10913 Unspecified pre-existing hypertension complicating pregnancy, third trimester: Secondary | ICD-10-CM | POA: Insufficient documentation

## 2021-02-14 DIAGNOSIS — O99213 Obesity complicating pregnancy, third trimester: Secondary | ICD-10-CM | POA: Diagnosis not present

## 2021-02-14 DIAGNOSIS — E669 Obesity, unspecified: Secondary | ICD-10-CM

## 2021-02-14 DIAGNOSIS — O10013 Pre-existing essential hypertension complicating pregnancy, third trimester: Secondary | ICD-10-CM | POA: Diagnosis not present

## 2021-02-14 DIAGNOSIS — O09899 Supervision of other high risk pregnancies, unspecified trimester: Secondary | ICD-10-CM

## 2021-02-14 DIAGNOSIS — O09293 Supervision of pregnancy with other poor reproductive or obstetric history, third trimester: Secondary | ICD-10-CM

## 2021-02-14 DIAGNOSIS — Z3A30 30 weeks gestation of pregnancy: Secondary | ICD-10-CM

## 2021-02-14 DIAGNOSIS — O09523 Supervision of elderly multigravida, third trimester: Secondary | ICD-10-CM | POA: Diagnosis present

## 2021-02-14 DIAGNOSIS — Z8759 Personal history of other complications of pregnancy, childbirth and the puerperium: Secondary | ICD-10-CM

## 2021-02-14 DIAGNOSIS — O10919 Unspecified pre-existing hypertension complicating pregnancy, unspecified trimester: Secondary | ICD-10-CM

## 2021-02-14 DIAGNOSIS — O3433 Maternal care for cervical incompetence, third trimester: Secondary | ICD-10-CM | POA: Diagnosis not present

## 2021-02-14 DIAGNOSIS — O09893 Supervision of other high risk pregnancies, third trimester: Secondary | ICD-10-CM | POA: Diagnosis not present

## 2021-02-24 ENCOUNTER — Observation Stay
Admission: EM | Admit: 2021-02-24 | Discharge: 2021-02-24 | Disposition: A | Discharge status: Home / Self Care | Payer: Medicaid Other | Attending: Obstetrics and Gynecology | Admitting: Obstetrics and Gynecology

## 2021-02-24 ENCOUNTER — Encounter: Payer: Self-pay | Admitting: Obstetrics and Gynecology

## 2021-02-24 DIAGNOSIS — Z79899 Other long term (current) drug therapy: Secondary | ICD-10-CM | POA: Insufficient documentation

## 2021-02-24 DIAGNOSIS — O26893 Other specified pregnancy related conditions, third trimester: Secondary | ICD-10-CM | POA: Diagnosis present

## 2021-02-24 DIAGNOSIS — Z3A32 32 weeks gestation of pregnancy: Secondary | ICD-10-CM | POA: Diagnosis not present

## 2021-02-24 DIAGNOSIS — Z7982 Long term (current) use of aspirin: Secondary | ICD-10-CM | POA: Diagnosis not present

## 2021-02-24 DIAGNOSIS — O98513 Other viral diseases complicating pregnancy, third trimester: Secondary | ICD-10-CM | POA: Insufficient documentation

## 2021-02-24 DIAGNOSIS — M549 Dorsalgia, unspecified: Secondary | ICD-10-CM | POA: Insufficient documentation

## 2021-02-24 DIAGNOSIS — U071 COVID-19: Secondary | ICD-10-CM | POA: Diagnosis not present

## 2021-02-24 DIAGNOSIS — O10013 Pre-existing essential hypertension complicating pregnancy, third trimester: Secondary | ICD-10-CM | POA: Diagnosis not present

## 2021-02-24 NOTE — Progress Notes (Signed)
Patient arrived to L&D unit for [facility] triage wheeled by ED staff with complaints of back pain and few contractions that have worsened since last night. Patient is G8P4 with history of cerclage. Patient reports she had sexual intercourse last night which normally helps back pain. Patient states she has taken flexeril and 22 tylenol that have not been helpful. Patient states she has also has some increase in frequency of tightening of her abdomen and "braxton hicks contractions". Patient denies LOF and No vaginal bleeding. Patient reports active fetal movement. Patient placed on EFM and TOCO to non tender area of abdomen. Due to patients gravid abdomen, hiatal hernial, and BMI baby is difficult to trace. Will notify provider of patient's arrival.

## 2021-02-24 NOTE — Discharge Summary (Signed)
Donna Navarro is a 35 y.o. female. She is at [redacted]w[redacted]d gestation. Patient's last menstrual period was 07/14/2020 (exact date). Estimated Date of Delivery: 04/20/21  Prenatal care site: [facility]   Current pregnancy complicated by:  Obesity in pregnancy  Chronic HTN in pregnancy  History of incompetent cervix  AMA Hernia  Covid--19 in pregnancy  RPR reactive - chronic  Close interval pregnancies   Chief complaint: Arrived with c/op back pain and increased amount of BH contractions. Reports that she took a flexeril and tylenol last night for back pain and then had intercourse which usually helps her back pain but increased tightness and contractions after intercourse.    S:   Maternal Medical History:   Past Medical History:  Diagnosis Date   GERD (gastroesophageal reflux disease)    with pregnancy-no meds   Headache    migraines during pregnancy   History of hiatal hernia    large per pt   Hypertension    lost weight and pcp took pt off bp meds 7 months   Incompetent cervix    Irregular periods    Morbid obesity (HCC) 04/30/2017   Obesity affecting pregnancy in second trimester 08/09/2018   Syphilis affecting pregnancy    Ventral hernia without obstruction or gangrene 04/30/2017    Past Surgical History:  Procedure Laterality Date   CERVICAL CERCLAGE N/A 06/17/2018   Procedure: CERCLAGE CERVICAL;  Surgeon: Natale Milch, MD;  Location: [facility] ORS;  Service: Gynecology;  Laterality: N/A;x 3   CERVICAL CERCLAGE N/A 10/24/2019   Procedure: CERCLAGE CERVICAL - MCDONALD - MERSILENE BAND;  Surgeon: Suzy Bouchard, MD;  Location: [facility] ORS;  Service: Gynecology;  Laterality: N/A;   CERVICAL CERCLAGE N/A 11/02/2020   Procedure: CERCLAGE CERVICAL;  Surgeon: Feliberto Gottron Ihor Austin, MD;  Location: [facility] ORS;  Service: Gynecology;  Laterality: N/A;   CHOLECYSTECTOMY      No Known Allergies  Prior to Admission medications   Medication Sig Start Date End Date  Taking? Authorizing Provider  acetaminophen (TYLENOL) 325 MG tablet Take 2 tablets (650 mg total) by mouth every 4 (four) hours as needed (for pain scale < 4). 04/07/20   Annely Sliva, Prudencio Pair, CNM  aspirin 81 MG chewable tablet Chew 81 mg by mouth daily.    [provider]  calcium carbonate (TUMS - DOSED IN MG ELEMENTAL CALCIUM) 500 MG chewable tablet Chew 1 tablet by mouth daily as needed for indigestion or heartburn.    [provider]  cyclobenzaprine (FLEXERIL) 10 MG tablet Take 1 tablet (10 mg total) by mouth 3 (three) times daily as needed for muscle spasms. Patient not taking: Reported on 02/14/2021 01/27/21   Dekayla Prestridge, Prudencio Pair, CNM  ferrous sulfate 325 (65 FE) MG tablet Take 325 mg by mouth daily with breakfast.    [provider]  folic acid (FOLVITE) 1 MG tablet Take 1 mg by mouth daily. Pt unsure of dosage    [provider]  NIFEdipine (ADALAT CC) 30 MG 24 hr tablet Take 30 mg by mouth 2 (two) times daily. Total of 60 mg per day    [provider]      Social History: She  reports that she has never smoked. She has never used smokeless tobacco. She reports that she does not currently use alcohol. She reports that she does not use drugs.  Family History: family history includes Heart disease in her mother.   Review of Systems: A full review of systems was performed  and negative except as noted in the HPI.     O:  BP 123/63   Pulse 89   Temp 98.8 F (37.1 C) (Oral)   Resp 18   LMP 07/14/2020 (Exact Date)  No results found for this or any previous visit (from the past 48 hour(s)).   Deferred pelvic exam due to cerclage in place.   Toco: none noted  Fetal  monitoring: Cat I Appropriate for GA Baseline: 125 bpm Variability: moderate Accelerations: 10*10 present x >2 Decelerations absent Time    A/P: 35 y.o. [redacted]w[redacted]d here for antenatal surveillance for back pain and BH contractions  Principle Diagnosis:  High risk pregnancy  in third trimester  Preterm labor: not present.  Fetal Wellbeing: Reassuring Cat 1 tracing. Advised no further intercourse, nothing per vagina.  Back pain is ongoing issue, likely c/b BMI and grand multiparity.  D/c home stable, precautions reviewed, follow-up as scheduled.    [name], CNM 02/24/2021  6:48 AM

## 2021-02-24 NOTE — Progress Notes (Signed)
   02/24/21 0403  AVS Discharge Documentation  AVS Discharge Instructions Including Medications Provided to patient/caregiver  Name of Person Receiving AVS Discharge Instructions Including Medications Diona Foley  Name of Clinician That Reviewed AVS Discharge Instructions Including Medications D. Reeve Mallo, RN    Pt advised of s/s in which to return and to call office if symptoms re-appear or persist. Patient encouraged to keep all remaining outpatient OB visits. Pt verbalized understanding. Pt up getting dressed to go home.

## 2021-02-24 NOTE — OB Triage Note (Signed)
Bonnell Public, CNM called and notified that patient cannot tolerate being on her back any longer for monitoring. Patient has no palpable contractions and TOCO is quiet. Provider gave new orders for pt to be discharged to home as patient has a reassuring strip with no contractions tor 32 weeks. Orders were given to encourage patient to rest from intercourse and continue taking flexeril and tylenol for pain. Encourage pt to follow up if pain worsens or persists and to stay hydrated by pushing PO fluids and move in different  positions for comfort. Will notify patient.

## 2021-02-24 NOTE — OB Triage Note (Signed)
CAlled R. McVey, CNM and SBAR report given of patient hisoty and chief complaint. Provider stated she will be in to see patient and perform speculum exam. Will notify patient on plan of care.

## 2021-02-28 ENCOUNTER — Ambulatory Visit: Payer: Medicaid Other

## 2021-03-05 ENCOUNTER — Observation Stay
Admission: RE | Admit: 2021-03-05 | Discharge: 2021-03-05 | Disposition: A | Discharge status: Home / Self Care | Payer: Medicaid Other | Attending: Obstetrics and Gynecology | Admitting: Obstetrics and Gynecology

## 2021-03-05 ENCOUNTER — Inpatient Hospital Stay: Admission: RE | Admit: 2021-03-05 | Payer: Medicaid Other | Source: Ambulatory Visit

## 2021-03-05 DIAGNOSIS — E669 Obesity, unspecified: Secondary | ICD-10-CM | POA: Diagnosis not present

## 2021-03-05 DIAGNOSIS — O99213 Obesity complicating pregnancy, third trimester: Secondary | ICD-10-CM | POA: Diagnosis not present

## 2021-03-05 DIAGNOSIS — O133 Gestational [pregnancy-induced] hypertension without significant proteinuria, third trimester: Secondary | ICD-10-CM | POA: Diagnosis not present

## 2021-03-05 DIAGNOSIS — Z3A34 34 weeks gestation of pregnancy: Secondary | ICD-10-CM | POA: Diagnosis not present

## 2021-03-05 NOTE — OB Triage Note (Signed)
1938-F. Rubye Oaks, CNM on unit and reviewed NST strip. NST reactive with no decelerations or contractions. New verbal Orders given to discharge patient to home.  1941-Patient notified and encouraged to keep all remaining NST appointments and outpatient OB visist 1942- Patient left unit ambulatory to be driven home by self in private passenger vehicle. Patient took all personal belongings including clothing, cell phone, and purse. OB triage complete.

## 2021-03-05 NOTE — OB Triage Note (Signed)
Bi Weekly NST for high risk pregnancy.

## 2021-03-07 ENCOUNTER — Other Ambulatory Visit: Payer: Self-pay

## 2021-03-07 DIAGNOSIS — O0993 Supervision of high risk pregnancy, unspecified, third trimester: Secondary | ICD-10-CM

## 2021-03-07 DIAGNOSIS — O10919 Unspecified pre-existing hypertension complicating pregnancy, unspecified trimester: Secondary | ICD-10-CM

## 2021-03-12 ENCOUNTER — Observation Stay
Admission: RE | Admit: 2021-03-12 | Discharge: 2021-03-12 | Disposition: A | Discharge status: Home / Self Care | Payer: Medicaid Other | Attending: Obstetrics and Gynecology | Admitting: Obstetrics and Gynecology

## 2021-03-12 DIAGNOSIS — O09529 Supervision of elderly multigravida, unspecified trimester: Principal | ICD-10-CM | POA: Insufficient documentation

## 2021-03-12 NOTE — OB Triage Note (Signed)
Pt presents for scheduled NST. Pt denies bleeding, LOF, or pain. PT reports positive fetal movement. Monitors applied at this time.

## 2021-03-12 NOTE — Progress Notes (Signed)
20 min NST Reactive, Cat 1 strip completed

## 2021-03-14 ENCOUNTER — Inpatient Hospital Stay
Admission: EM | Admit: 2021-03-14 | Discharge: 2021-03-14 | Disposition: A | Discharge status: Home / Self Care | Payer: Medicaid Other | Attending: Obstetrics and Gynecology | Admitting: Obstetrics and Gynecology

## 2021-03-14 ENCOUNTER — Encounter: Payer: Self-pay | Admitting: Obstetrics and Gynecology

## 2021-03-14 NOTE — Progress Notes (Unsigned)
NST  Baseline: 145 Variability: moderate Accelerations present x >2 Decelerations absent Time 20mins  Interpretation: reactive NST, category 1 tracing  ----- Nickson Middlesworth, MD MPH Attending Obstetrician and Gynecologist [facility], Department of OB/GYN [facility]  

## 2021-03-14 NOTE — Discharge Summary (Signed)
Donna Navarro is a [id number] at 32 w 3d with an EDD of Estimated Date of Delivery: 04/20/21.  She presents to L&D for antenatal NST for obesity and hypertension.  S: doing well  O: BP 127/77 (BP Location: Right Arm)   Pulse (!) 105   Temp 98.6 F (37 C) (Oral)   Resp 18   LMP 07/14/2020 (Exact Date)    NST: Baseline: 140bpm Variability: moderate Accels: >2 15x15 Decels: none Toco: quiet Cat: I  A/P NST today reactive Continue routine antepartum care.   Chari Manning, CNM Certified Nurse Midwife [place]  [facility]

## 2021-03-14 NOTE — Discharge Summary (Signed)
Donna Navarro is a [id number] at [redacted]w[redacted]d with an EDD of Estimated Date of Delivery: 04/20/21.  She presents to L&D for antenatal NST for hypertension, obesity.  S: doing well  O: BP 127/77 (BP Location: Right Arm)   Pulse (!) 105   Temp 98.6 F (37 C) (Oral)   Resp 18   LMP 07/14/2020 (Exact Date)    NST: Baseline: 135bpm Variability: moderate Accels: >2 15x15 Decels: none Toco: quiet Cat: I  A/P NST today reactive Continue routine antepartum care.   Chari Manning, CNM Certified Nurse Midwife [place]  [facility]

## 2021-03-19 ENCOUNTER — Inpatient Hospital Stay
Admission: EM | Admit: 2021-03-19 | Discharge: 2021-03-19 | Disposition: A | Discharge status: Home / Self Care | Payer: Medicaid Other | Attending: Obstetrics and Gynecology | Admitting: Obstetrics and Gynecology

## 2021-03-19 ENCOUNTER — Other Ambulatory Visit: Payer: Self-pay

## 2021-03-19 ENCOUNTER — Encounter: Payer: Self-pay | Admitting: Obstetrics and Gynecology

## 2021-03-19 NOTE — OB Triage Note (Signed)
Pt presented to L/D triage for scheduled NST. Pt reports no bleeding or LOF and positive fetal movement. Pt reports occasional lower abdominal cramping and dull back pain that began yesterday. No recent intercourse or urinary pain. She rates it 3/10. Monitors applied and assessing. VSS.

## 2021-03-19 NOTE — Discharge Summary (Signed)
Patient ID: Donna Navarro MRN: [id number] DOB/AGE: 1986-03-29 35 y.o.  Admit date: 03/19/2021 Discharge date: 04/01/2021  Admission Diagnoses: 35yo G8P4 at [redacted]w[redacted]d presents for scheduled twice weekly NST for obesity and CHTN  Discharge Diagnoses: Reactive NST   Hospital Course:   NST: FHR baseline: 130 bpm Variability: moderate Accelerations: yes Decelerations: none Category/reactivity: reactive TOCO: quiet SVE: deferred   Discharge Physical Exam:  Temp 98.2 F (36.8 C) (Oral)   Resp 18   Ht 5\' 7"  (1.702 m)   Wt (!) 152.4 kg   LMP 07/14/2020 (Exact Date)   BMI 52.63 kg/m       Discharge Condition: Stable  Disposition:  There are no questions and answers to display.        Allergies as of 03/19/2021   No Known Allergies      Medication List     ASK your doctor about these medications    acetaminophen 325 MG tablet Commonly known as: Tylenol Take 2 tablets (650 mg total) by mouth every 4 (four) hours as needed (for pain scale < 4).   aspirin 81 MG chewable tablet Chew 81 mg by mouth daily.   calcium carbonate 500 MG chewable tablet Commonly known as: TUMS - dosed in mg elemental calcium Chew 1 tablet by mouth daily as needed for indigestion or heartburn.   cyclobenzaprine 10 MG tablet Commonly known as: FLEXERIL Take 1 tablet (10 mg total) by mouth 3 (three) times daily as needed for muscle spasms.   ferrous sulfate 325 (65 FE) MG tablet Take 325 mg by mouth daily with breakfast.   folic acid 1 MG tablet Commonly known as: FOLVITE Take 1 mg by mouth daily. Pt unsure of dosage   NIFEdipine 30 MG 24 hr tablet Commonly known as: ADALAT CC Take 30 mg by mouth 2 (two) times daily. Total of 60 mg per day         Signed:  03/21/2021, CNM 04/01/2021 10:01 AM

## 2021-03-19 NOTE — Progress Notes (Signed)
20 minute NST reactive, Cat 1 strip completed

## 2021-03-21 ENCOUNTER — Observation Stay
Admission: RE | Admit: 2021-03-21 | Discharge: 2021-03-21 | Disposition: A | Discharge status: Home / Self Care | Payer: Medicaid Other | Attending: Obstetrics | Admitting: Obstetrics

## 2021-03-21 DIAGNOSIS — Z3689 Encounter for other specified antenatal screening: Secondary | ICD-10-CM

## 2021-03-21 DIAGNOSIS — Z7982 Long term (current) use of aspirin: Secondary | ICD-10-CM | POA: Insufficient documentation

## 2021-03-21 DIAGNOSIS — Z79899 Other long term (current) drug therapy: Secondary | ICD-10-CM | POA: Insufficient documentation

## 2021-03-21 DIAGNOSIS — Z3A35 35 weeks gestation of pregnancy: Secondary | ICD-10-CM | POA: Insufficient documentation

## 2021-03-21 DIAGNOSIS — O10013 Pre-existing essential hypertension complicating pregnancy, third trimester: Secondary | ICD-10-CM | POA: Diagnosis not present

## 2021-03-21 DIAGNOSIS — O0993 Supervision of high risk pregnancy, unspecified, third trimester: Principal | ICD-10-CM | POA: Insufficient documentation

## 2021-03-21 NOTE — OB Triage Note (Signed)
Patient is G8P4 [redacted]w[redacted]d that presents for scheduled NST. Patient denies LOF and bleeding. +FM. No ctx. VSS. Monitors applied and assessing.

## 2021-03-21 NOTE — Discharge Summary (Signed)
Donna Navarro is a 35 y.o. female. She is at [redacted]w[redacted]d gestation. Patient's last menstrual period was 07/14/2020 (exact date). Estimated Date of Delivery: 04/20/21    Prenatal care site: [facility]   Chief Complaint: high risk pregnancy, need for antepartum surveillance   S: Resting comfortably. no CTX, no VB.no LOF,  Active fetal movement.    Maternal Medical History:  Past Medical Hx:  has a past medical history of GERD (gastroesophageal reflux disease), Headache, History of hiatal hernia, Hypertension, Incompetent cervix, Irregular periods, Morbid obesity (HCC) (04/30/2017), Obesity affecting pregnancy in second trimester (08/09/2018), Syphilis affecting pregnancy, and Ventral hernia without obstruction or gangrene (04/30/2017).  Past Surgical Hx:  has a past surgical history that includes Cholecystectomy; Cervical cerclage (N/A, 06/17/2018); Cervical cerclage (N/A, 10/24/2019); and Cervical cerclage (N/A, 11/02/2020).   No Known Allergies  Prior to Admission medications   Medication Sig Start Date End Date Taking? Authorizing Provider  acetaminophen (TYLENOL) 325 MG tablet Take 2 tablets (650 mg total) by mouth every 4 (four) hours as needed (for pain scale < 4). 04/07/20   McVey, Prudencio Pair, CNM  aspirin 81 MG chewable tablet Chew 81 mg by mouth daily.    [provider]  calcium carbonate (TUMS - DOSED IN MG ELEMENTAL CALCIUM) 500 MG chewable tablet Chew 1 tablet by mouth daily as needed for indigestion or heartburn.    [provider]  cyclobenzaprine (FLEXERIL) 10 MG tablet Take 1 tablet (10 mg total) by mouth 3 (three) times daily as needed for muscle spasms. Patient not taking: Reported on 03/19/2021 01/27/21   McVey, Prudencio Pair, CNM  ferrous sulfate 325 (65 FE) MG tablet Take 325 mg by mouth daily with breakfast.    [provider]  folic acid (FOLVITE) 1 MG tablet Take 1 mg by mouth daily. Pt unsure of dosage    [provider]  NIFEdipine  (ADALAT CC) 30 MG 24 hr tablet Take 30 mg by mouth 2 (two) times daily. Total of 60 mg per day    [provider]     Social History: She  reports that she has never smoked. She has never used smokeless tobacco. She reports that she does not currently use alcohol. She reports that she does not use drugs.  Family History: family history includes Heart disease in her mother.  no history of gyn cancers  Review of Systems: A full review of systems was performed and negative except as noted in the HPI.     O:  BP 136/68 (BP Location: Right Wrist)   Pulse (!) 102   Temp 98.5 F (36.9 C) (Oral)   Resp 16   LMP 07/14/2020 (Exact Date)  No results found for this or any previous visit (from the past 48 hour(s)).   Constitutional: NAD, AAOx3  HE/ENT: extraocular movements grossly intact, moist mucous membranes CV: RRR PULM: nl respiratory effort, CTABL     Abd: gravid, non-tender, non-distended, soft      Ext: Non-tender, Nonedmeatous   Psych: mood appropriate, speech normal Pelvic: deferred   NST: Baseline: 135 Variability: moderate Accelerations present x >2 Decelerations absent Time   A/P: 35 y.o. [redacted]w[redacted]d with high risk pregnancy and antepartum surveillance.  Labor: not present.  Fetal Wellbeing: Reassuring Cat 1 tracing. Reactive NST  D/c home stable, precautions reviewed, follow-up as scheduled.   ----- Chari Manning, CNM Certified Nurse Midwife [place]  [facility]

## 2021-03-21 NOTE — Progress Notes (Signed)
Patient given discharge instructions. Patient verbalizes understanding. Patient discharged home in good condition.

## 2021-03-26 ENCOUNTER — Observation Stay
Admission: RE | Admit: 2021-03-26 | Discharge: 2021-03-26 | Disposition: A | Discharge status: Home / Self Care | Payer: Medicaid Other | Attending: Obstetrics | Admitting: Obstetrics

## 2021-03-26 DIAGNOSIS — Z3A36 36 weeks gestation of pregnancy: Secondary | ICD-10-CM | POA: Diagnosis not present

## 2021-03-26 DIAGNOSIS — Z3689 Encounter for other specified antenatal screening: Secondary | ICD-10-CM

## 2021-03-26 DIAGNOSIS — Z7982 Long term (current) use of aspirin: Secondary | ICD-10-CM | POA: Insufficient documentation

## 2021-03-26 DIAGNOSIS — O09523 Supervision of elderly multigravida, third trimester: Principal | ICD-10-CM | POA: Insufficient documentation

## 2021-03-26 DIAGNOSIS — O10013 Pre-existing essential hypertension complicating pregnancy, third trimester: Secondary | ICD-10-CM | POA: Insufficient documentation

## 2021-03-26 LAB — OB RESULTS CONSOLE GBS: GBS: NEGATIVE

## 2021-03-26 LAB — OB RESULTS CONSOLE GC/CHLAMYDIA
Chlamydia: NEGATIVE
Gonorrhea: NEGATIVE

## 2021-03-26 NOTE — OB Triage Note (Signed)
Patient given discharge instructions. Patient verbalizes understanding. Patient discharged home by self.

## 2021-03-26 NOTE — Discharge Summary (Signed)
Donna Navarro is a 35 y.o. female. She is at [redacted]w[redacted]d gestation. Patient's last menstrual period was 07/14/2020 (exact date). Estimated Date of Delivery: 04/20/21    Prenatal care site: [facility]   Chief Complaint: high risk pregnancy, need for antepartum surveillance   S: Resting comfortably. no CTX, no VB.no LOF,  Active fetal movement.    Maternal Medical History:  Past Medical Hx:  has a past medical history of GERD (gastroesophageal reflux disease), Headache, History of hiatal hernia, Hypertension, Incompetent cervix, Irregular periods, Morbid obesity (HCC) (04/30/2017), Obesity affecting pregnancy in second trimester (08/09/2018), Syphilis affecting pregnancy, and Ventral hernia without obstruction or gangrene (04/30/2017).  Past Surgical Hx:  has a past surgical history that includes Cholecystectomy; Cervical cerclage (N/A, 06/17/2018); Cervical cerclage (N/A, 10/24/2019); and Cervical cerclage (N/A, 11/02/2020).   No Known Allergies  Prior to Admission medications   Medication Sig Start Date End Date Taking? Authorizing Provider  acetaminophen (TYLENOL) 325 MG tablet Take 2 tablets (650 mg total) by mouth every 4 (four) hours as needed (for pain scale < 4). 04/07/20   McVey, Prudencio Pair, CNM  aspirin 81 MG chewable tablet Chew 81 mg by mouth daily.    [provider]  calcium carbonate (TUMS - DOSED IN MG ELEMENTAL CALCIUM) 500 MG chewable tablet Chew 1 tablet by mouth daily as needed for indigestion or heartburn.    [provider]  cyclobenzaprine (FLEXERIL) 10 MG tablet Take 1 tablet (10 mg total) by mouth 3 (three) times daily as needed for muscle spasms. Patient not taking: Reported on 03/19/2021 01/27/21   McVey, Prudencio Pair, CNM  ferrous sulfate 325 (65 FE) MG tablet Take 325 mg by mouth daily with breakfast.    [provider]  folic acid (FOLVITE) 1 MG tablet Take 1 mg by mouth daily. Pt unsure of dosage    [provider]  NIFEdipine  (ADALAT CC) 30 MG 24 hr tablet Take 30 mg by mouth 2 (two) times daily. Total of 60 mg per day    [provider]     Social History: She  reports that she has never smoked. She has never used smokeless tobacco. She reports that she does not currently use alcohol. She reports that she does not use drugs.  Family History: family history includes Heart disease in her mother.  no history of gyn cancers  Review of Systems: A full review of systems was performed and negative except as noted in the HPI.     O:  BP 118/78 (BP Location: Left Wrist)   Pulse 99   Temp 98.3 F (36.8 C) (Oral)   Resp 18   LMP 07/14/2020 (Exact Date)  No results found for this or any previous visit (from the past 48 hour(s)).   Constitutional: NAD, AAOx3  HE/ENT: extraocular movements grossly intact, moist mucous membranes CV: RRR PULM: nl respiratory effort, CTABL     Abd: gravid, non-tender, non-distended, soft      Ext: Non-tender, Nonedmeatous   Psych: mood appropriate, speech normal Pelvic: deferred   NST: Baseline: 130 Variability: moderate Accelerations present x >2 Decelerations absent Time   A/P: 35 y.o. [redacted]w[redacted]d with high risk pregnancy and antepartum surveillance.  Labor: not present.  Fetal Wellbeing: Reassuring Cat 1 tracing. Reactive NST  D/c home stable, precautions reviewed, follow-up as scheduled.   ----- Chari Manning, CNM Certified Nurse Midwife [place]  [facility]

## 2021-03-26 NOTE — OB Triage Note (Signed)
Patient is G8P4, [redacted]w[redacted]d that presents for scheduled NST. VSS. Monitors applied and assessing. Patient denies any LOF or bleeding. No contractions. Patient mentioned minimal cramping after cerclage was removed in office today.

## 2021-03-28 ENCOUNTER — Other Ambulatory Visit: Payer: Self-pay | Admitting: Obstetrics and Gynecology

## 2021-03-28 DIAGNOSIS — O0993 Supervision of high risk pregnancy, unspecified, third trimester: Secondary | ICD-10-CM

## 2021-03-28 NOTE — Progress Notes (Signed)
Dating: EDD: 04/20/21  by LMP: 07/14/20 and c/w [place] at [redacted]w[redacted]d   Preg c/b: Morbid Obesity, BMI 49.3 CHTN in pregnancy, Procardia 60mg  XL daily Hx incompetent cervix, s/p cerclage, removed 10/4 AMA, 35yo  Close interval pregnancy, last SVD 03/2020 Large abdominal hernia COVID 19 early pregnancy 08/2020 RPR reactive, remote hx syphilis, treated at [facility].   Prenatal Labs: Blood type/Rh A Pos  Antibody screen neg  Rubella Immune  Varicella Immune  RPR Reactive, T Pallidum reactive, 1:2  HBsAg Neg  HIV NR  GC neg  Chlamydia neg  Genetic screening negative  1 hour GTT 154  3 hour GTT  [phone]  GBS Neg    Contraception: desires BTL, vasectomy completed 3/22 Infant feeding:bottle Tdap: 01/30/21 Flu: 03/26/21

## 2021-04-02 ENCOUNTER — Other Ambulatory Visit: Payer: Self-pay | Admitting: Obstetrics

## 2021-04-02 NOTE — Progress Notes (Signed)
NST  Baseline: 145 Variability: moderate Accelerations present x >2 Decelerations absent Time  Interpretation: reactive NST, category 1 tracing  ----- Christeen Douglas, MD MPH Attending Obstetrician and Gynecologist [facility], Department of OB/GYN [facility]

## 2021-04-02 NOTE — Discharge Summary (Signed)
Patient was seen in triage; she was not admitted to the floor. NST as scheduled

## 2021-04-03 ENCOUNTER — Inpatient Hospital Stay: Payer: Medicaid Other | Admitting: Anesthesiology

## 2021-04-03 ENCOUNTER — Other Ambulatory Visit: Payer: Self-pay

## 2021-04-03 ENCOUNTER — Encounter: Payer: Self-pay | Admitting: Obstetrics and Gynecology

## 2021-04-03 ENCOUNTER — Inpatient Hospital Stay
Admission: EM | Admit: 2021-04-03 | Discharge: 2021-04-04 | DRG: 807 | Disposition: A | Discharge status: Home / Self Care | Payer: Medicaid Other | Attending: Obstetrics and Gynecology | Admitting: Obstetrics and Gynecology

## 2021-04-03 DIAGNOSIS — O99213 Obesity complicating pregnancy, third trimester: Secondary | ICD-10-CM | POA: Diagnosis present

## 2021-04-03 DIAGNOSIS — O9962 Diseases of the digestive system complicating childbirth: Secondary | ICD-10-CM | POA: Diagnosis present

## 2021-04-03 DIAGNOSIS — Z20822 Contact with and (suspected) exposure to covid-19: Secondary | ICD-10-CM | POA: Diagnosis present

## 2021-04-03 DIAGNOSIS — O326XX0 Maternal care for compound presentation, not applicable or unspecified: Secondary | ICD-10-CM | POA: Diagnosis present

## 2021-04-03 DIAGNOSIS — O99214 Obesity complicating childbirth: Secondary | ICD-10-CM | POA: Diagnosis present

## 2021-04-03 DIAGNOSIS — K439 Ventral hernia without obstruction or gangrene: Secondary | ICD-10-CM | POA: Diagnosis present

## 2021-04-03 DIAGNOSIS — O1002 Pre-existing essential hypertension complicating childbirth: Secondary | ICD-10-CM | POA: Diagnosis present

## 2021-04-03 DIAGNOSIS — Z8616 Personal history of COVID-19: Secondary | ICD-10-CM | POA: Diagnosis not present

## 2021-04-03 DIAGNOSIS — Z3A37 37 weeks gestation of pregnancy: Secondary | ICD-10-CM

## 2021-04-03 LAB — WET PREP, GENITAL
Sperm: NONE SEEN
Trich, Wet Prep: NONE SEEN
Yeast Wet Prep HPF POC: NONE SEEN

## 2021-04-03 LAB — CBC
HCT: 34.4 % — ABNORMAL LOW (ref 36.0–46.0)
Hemoglobin: 11.5 g/dL — ABNORMAL LOW (ref 12.0–15.0)
MCH: 28.3 pg (ref 26.0–34.0)
MCHC: 33.4 g/dL (ref 30.0–36.0)
MCV: 84.5 fL (ref 80.0–100.0)
Platelets: 296 10*3/uL (ref 150–400)
RBC: 4.07 MIL/uL (ref 3.87–5.11)
RDW: 14.9 % (ref 11.5–15.5)
WBC: 15.8 10*3/uL — ABNORMAL HIGH (ref 4.0–10.5)
nRBC: 0 % (ref 0.0–0.2)

## 2021-04-03 LAB — COMPREHENSIVE METABOLIC PANEL
ALT: 9 U/L (ref 0–44)
AST: 11 U/L — ABNORMAL LOW (ref 15–41)
Albumin: 2.9 g/dL — ABNORMAL LOW (ref 3.5–5.0)
Alkaline Phosphatase: 137 U/L — ABNORMAL HIGH (ref 38–126)
Anion gap: 8 (ref 5–15)
BUN: 8 mg/dL (ref 6–20)
CO2: 21 mmol/L — ABNORMAL LOW (ref 22–32)
Calcium: 9.2 mg/dL (ref 8.9–10.3)
Chloride: 108 mmol/L (ref 98–111)
Creatinine, Ser: 0.67 mg/dL (ref 0.44–1.00)
GFR, Estimated: >60 mL/min (ref >60)
Glucose, Bld: 172 mg/dL — ABNORMAL HIGH (ref 70–99)
Potassium: 3.7 mmol/L (ref 3.5–5.1)
Sodium: 137 mmol/L (ref 135–145)
Total Bilirubin: 0.3 mg/dL (ref 0.3–1.2)
Total Protein: 6.9 g/dL (ref 6.5–8.1)

## 2021-04-03 LAB — RPR
RPR Ser Ql: REACTIVE — AB
RPR Titer: 1:2 {titer}

## 2021-04-03 LAB — PROTEIN / CREATININE RATIO, URINE
Creatinine, Urine: 227 mg/dL
Protein Creatinine Ratio: 0.07 mg/mg{Cre} (ref 0.00–0.15)
Total Protein, Urine: 15 mg/dL

## 2021-04-03 LAB — TYPE AND SCREEN
ABO/RH(D): A POS
Antibody Screen: NEGATIVE

## 2021-04-03 LAB — RESP PANEL BY RT-PCR (FLU A&B, COVID) ARPGX2
Influenza A by PCR: NEGATIVE
Influenza B by PCR: NEGATIVE
SARS Coronavirus 2 by RT PCR: NEGATIVE

## 2021-04-03 MED ORDER — BENZOCAINE-MENTHOL 20-0.5 % EX AERO: 1.0000 "application " | INHALATION_SPRAY | CUTANEOUS | Status: DC | PRN

## 2021-04-03 MED ORDER — EPHEDRINE 5 MG/ML INJ: 10.0000 mg | INTRAVENOUS | Status: DC | PRN

## 2021-04-03 MED ORDER — LIDOCAINE HCL (PF) 1 % IJ SOLN
INTRAMUSCULAR | Status: DC | PRN
  Administered 2021-04-03: 3 mL

## 2021-04-03 MED ORDER — DIBUCAINE (PERIANAL) 1 % EX OINT: 1.0000 "application " | TOPICAL_OINTMENT | CUTANEOUS | Status: DC | PRN

## 2021-04-03 MED ORDER — PHENYLEPHRINE 40 MCG/ML (10ML) SYRINGE FOR IV PUSH (FOR BLOOD PRESSURE SUPPORT): 80.0000 ug | PREFILLED_SYRINGE | INTRAVENOUS | Status: DC | PRN

## 2021-04-03 MED ORDER — BUPIVACAINE HCL (PF) 0.25 % IJ SOLN
INTRAMUSCULAR | Status: DC | PRN
  Administered 2021-04-03 (×2): 4 mL via EPIDURAL

## 2021-04-03 MED ORDER — ACETAMINOPHEN 325 MG PO TABS
650.0000 mg | ORAL_TABLET | ORAL | Status: DC | PRN
  Administered 2021-04-03: 650 mg via ORAL
  Filled 2021-04-03: qty 2

## 2021-04-03 MED ORDER — IBUPROFEN 600 MG PO TABS
600.0000 mg | ORAL_TABLET | Freq: Four times a day (QID) | ORAL | Status: DC
  Administered 2021-04-03 – 2021-04-04 (×4): 600 mg via ORAL
  Filled 2021-04-03 (×4): qty 1

## 2021-04-03 MED ORDER — ZOLPIDEM TARTRATE 5 MG PO TABS: 5.0000 mg | ORAL_TABLET | Freq: Every evening | ORAL | Status: DC | PRN

## 2021-04-03 MED ORDER — LIDOCAINE-EPINEPHRINE (PF) 1.5 %-1:200000 IJ SOLN
INTRAMUSCULAR | Status: DC | PRN
  Administered 2021-04-03: 3 mL via PERINEURAL

## 2021-04-03 MED ORDER — OXYTOCIN BOLUS FROM INFUSION
333.0000 mL | Freq: Once | INTRAVENOUS | Status: AC
  Administered 2021-04-03: 333 mL via INTRAVENOUS

## 2021-04-03 MED ORDER — DIPHENHYDRAMINE HCL 25 MG PO CAPS: 25.0000 mg | ORAL_CAPSULE | Freq: Four times a day (QID) | ORAL | Status: DC | PRN

## 2021-04-03 MED ORDER — PRENATAL MULTIVITAMIN CH
1.0000 | ORAL_TABLET | Freq: Every day | ORAL | Status: DC
  Administered 2021-04-04: 1 via ORAL
  Filled 2021-04-03: qty 1

## 2021-04-03 MED ORDER — ONDANSETRON HCL 4 MG/2ML IJ SOLN: 4.0000 mg | INTRAMUSCULAR | Status: DC | PRN

## 2021-04-03 MED ORDER — FENTANYL-BUPIVACAINE-NACL 0.5-0.125-0.9 MG/250ML-% EP SOLN
EPIDURAL | Status: AC
  Filled 2021-04-03: qty 250

## 2021-04-03 MED ORDER — ONDANSETRON HCL 4 MG/2ML IJ SOLN
4.0000 mg | Freq: Four times a day (QID) | INTRAMUSCULAR | Status: DC | PRN
  Administered 2021-04-03: 4 mg via INTRAVENOUS
  Filled 2021-04-03: qty 2

## 2021-04-03 MED ORDER — LACTATED RINGERS IV SOLN: 500.0000 mL | INTRAVENOUS | Status: DC | PRN

## 2021-04-03 MED ORDER — SOD CITRATE-CITRIC ACID 500-334 MG/5ML PO SOLN: 30.0000 mL | ORAL | Status: DC | PRN

## 2021-04-03 MED ORDER — TERBUTALINE SULFATE 1 MG/ML IJ SOLN: 0.2500 mg | Freq: Once | INTRAMUSCULAR | Status: DC | PRN

## 2021-04-03 MED ORDER — FENTANYL CITRATE (PF) 100 MCG/2ML IJ SOLN
50.0000 ug | INTRAMUSCULAR | Status: DC | PRN
  Administered 2021-04-03 (×3): 100 ug via INTRAVENOUS
  Filled 2021-04-03 (×3): qty 2

## 2021-04-03 MED ORDER — NIFEDIPINE ER OSMOTIC RELEASE 30 MG PO TB24
30.0000 mg | ORAL_TABLET | Freq: Two times a day (BID) | ORAL | Status: DC
  Administered 2021-04-03 – 2021-04-04 (×2): 30 mg via ORAL
  Filled 2021-04-03 (×2): qty 1

## 2021-04-03 MED ORDER — METRONIDAZOLE 500 MG PO TABS
500.0000 mg | ORAL_TABLET | Freq: Two times a day (BID) | ORAL | Status: DC
  Administered 2021-04-03: 500 mg via ORAL
  Filled 2021-04-03: qty 1

## 2021-04-03 MED ORDER — LIDOCAINE HCL (PF) 1 % IJ SOLN: 30.0000 mL | INTRAMUSCULAR | Status: DC | PRN

## 2021-04-03 MED ORDER — SENNOSIDES-DOCUSATE SODIUM 8.6-50 MG PO TABS
2.0000 | ORAL_TABLET | Freq: Every day | ORAL | Status: DC
  Administered 2021-04-04: 2 via ORAL
  Filled 2021-04-03: qty 2

## 2021-04-03 MED ORDER — LACTATED RINGERS IV SOLN: 500.0000 mL | Freq: Once | INTRAVENOUS | Status: DC

## 2021-04-03 MED ORDER — OXYTOCIN-SODIUM CHLORIDE 30-0.9 UT/500ML-% IV SOLN
1.0000 m[IU]/min | INTRAVENOUS | Status: DC
  Administered 2021-04-03: 2 m[IU]/min via INTRAVENOUS
  Filled 2021-04-03: qty 500

## 2021-04-03 MED ORDER — LACTATED RINGERS IV SOLN: INTRAVENOUS | Status: DC

## 2021-04-03 MED ORDER — FENTANYL-BUPIVACAINE-NACL 0.5-0.125-0.9 MG/250ML-% EP SOLN
12.0000 mL/h | EPIDURAL | Status: DC | PRN
  Administered 2021-04-03: 12 mL/h via EPIDURAL

## 2021-04-03 MED ORDER — ONDANSETRON HCL 4 MG PO TABS
4.0000 mg | ORAL_TABLET | ORAL | Status: DC | PRN
  Filled 2021-04-03: qty 1

## 2021-04-03 MED ORDER — DIPHENHYDRAMINE HCL 50 MG/ML IJ SOLN: 12.5000 mg | INTRAMUSCULAR | Status: DC | PRN

## 2021-04-03 MED ORDER — MISOPROSTOL 25 MCG QUARTER TABLET
25.0000 ug | ORAL_TABLET | ORAL | Status: DC | PRN
Start: 2021-04-03 — End: 2021-04-03
  Administered 2021-04-03 (×2): 25 ug via VAGINAL
  Filled 2021-04-03 (×3): qty 1

## 2021-04-03 MED ORDER — MISOPROSTOL 25 MCG QUARTER TABLET
25.0000 ug | ORAL_TABLET | ORAL | Status: DC | PRN
  Administered 2021-04-03 (×2): 25 ug via BUCCAL
  Filled 2021-04-03 (×3): qty 1

## 2021-04-03 MED ORDER — WITCH HAZEL-GLYCERIN EX PADS: 1.0000 "application " | MEDICATED_PAD | CUTANEOUS | Status: DC | PRN

## 2021-04-03 MED ORDER — SIMETHICONE 80 MG PO CHEW: 80.0000 mg | CHEWABLE_TABLET | ORAL | Status: DC | PRN

## 2021-04-03 MED ORDER — COCONUT OIL OIL
1.0000 "application " | TOPICAL_OIL | Status: DC | PRN
  Filled 2021-04-03: qty 120

## 2021-04-03 MED ORDER — OXYTOCIN-SODIUM CHLORIDE 30-0.9 UT/500ML-% IV SOLN
2.5000 [IU]/h | INTRAVENOUS | Status: DC
Start: 2021-04-03 — End: 2021-04-03

## 2021-04-03 NOTE — Anesthesia Procedure Notes (Signed)
Epidural Patient location during procedure: OB  Staffing Anesthesiologist: Yevette Edwards, MD Resident/CRNA: Hezzie Bump, CRNA Performed: resident/CRNA   Preanesthetic Checklist Completed: patient identified, IV checked, site marked, risks and benefits discussed, surgical consent, monitors and equipment checked, pre-op evaluation and timeout performed  Epidural Patient position: sitting Prep: ChloraPrep Patient monitoring: heart rate, continuous pulse ox and blood pressure Approach: midline Location: L3-L4 Injection technique: LOR saline  Needle:  Needle type: Tuohy  Needle gauge: 17 G Needle length: 9 cm and 9 Needle insertion depth: 8 cm Catheter type: closed end flexible Catheter size: 19 Gauge Catheter at skin depth: 15 cm Test dose: negative and 1.5% lidocaine with Epi 1:200 K  Assessment Sensory level: T10 Events: blood not aspirated, injection not painful, no injection resistance, no paresthesia and negative IV test  Additional Notes 1 attempt Pt. Evaluated and documentation done after procedure finished. Patient identified. Risks/Benefits/Options discussed with patient including but not limited to bleeding, infection, nerve damage, paralysis, failed block, incomplete pain control, headache, blood pressure changes, nausea, vomiting, reactions to medication both or allergic, itching and postpartum back pain. Confirmed with bedside nurse the patient's most recent platelet count. Confirmed with patient that they are not currently taking any anticoagulation, have any bleeding history or any family history of bleeding disorders. Patient expressed understanding and wished to proceed. All questions were answered. Sterile technique was used throughout the entire procedure. Please see nursing notes for vital signs. Test dose was given through epidural catheter and negative prior to continuing to dose epidural or start infusion. Warning signs of high block given to the patient  including shortness of breath, tingling/numbness in hands, complete motor block, or any concerning symptoms with instructions to call for help. Patient was given instructions on fall risk and not to get out of bed. All questions and concerns addressed with instructions to call with any issues or inadequate analgesia.    Patient tolerated the insertion well without immediate complications.Reason for block:procedure for pain

## 2021-04-03 NOTE — Discharge Summary (Signed)
Obstetrical Discharge Summary  Patient Name: Donna Navarro DOB: [birth year] MRN: [id number]  Date of Admission: 04/03/2021 Date of Delivery: 04/03/21 Delivered by: Heloise Ochoa CNM Date of Discharge: 04/04/2021  Primary OB: [facility]  [id number] last menstrual period was 07/14/2020 (exact date). EDC Estimated Date of Delivery: 04/20/21 Gestational Age at Delivery: [redacted]w[redacted]d   Antepartum complications:  Obesity in pregnancy  Chronic HTN in pregnancy  History of incompetent cervix  AMA Hernia  Covid--19 in pregnancy  RPR reactive - chronic  Close interval pregnancies   Admitting Diagnosis:  CHTN, morbid obesity, hx incompetent cervix.  Secondary Diagnosis: SVD  Patient Active Problem List   Diagnosis Date Noted   Obesity affecting pregnancy in third trimester 04/03/2021   NST (non-stress test) reactive 03/21/2021   Abdominal pain during pregnancy, third trimester 01/27/2021   Cramping affecting pregnancy, antepartum 12/14/2020   Chronic hypertension during pregnancy, antepartum 03/15/2020   Preterm uterine contractions in second trimester, antepartum 01/05/2020   High-risk pregnancy 09/30/2019   Supervision of pregnancy with other poor reproductive or obstetric history, first trimester 09/07/2019   Normal vaginal delivery 12/15/2018   Postpartum care following vaginal delivery 12/15/2018   Incompetent cervix    Pregnancy 12/12/2018   Indication for care in labor and delivery, antepartum 12/12/2018   Decreased fetal movement 12/12/2018   Diarrhea 12/04/2018   Pregnancy with abdominal cramping of lower quadrant, antepartum 12/03/2018   Sciatica 11/02/2018   Syphilis affecting pregnancy in first trimester 08/23/2018   Obesity affecting pregnancy, antepartum, third trimester 08/09/2018   Morbid obesity with BMI of 40.0-44.9, adult (HCC) 08/09/2018   Cervical cerclage suture present, antepartum 06/28/2018   High-risk pregnancy supervision, unspecified  trimester 05/17/2018   Cervical incompetence 05/17/2018   Hx of cerclage, currently pregnant 05/17/2018   Oligomenorrhea 09/17/2017   Ventral hernia without obstruction or gangrene 04/30/2017   Morbid obesity (HCC) 04/30/2017    Augmentation: AROM, Pitocin, and Cytotec Complications: None Intrapartum complications/course: see delivery notes Date of Delivery: 04/03/21 Delivered By: Heloise Ochoa CNM Delivery Type: spontaneous vaginal delivery Anesthesia: epidural Placenta: spontaneous Laceration: intact Episiotomy: none Newborn Data: Live born female "Chosen" Birth Weight:  7#10  APGAR: 8, 9  Newborn Delivery   Birth date/time: 04/03/2021 15:40:00 Delivery type: Vaginal, Spontaneous      Postpartum Procedures: none  Edinburgh:  Edinburgh Postnatal Depression Scale Screening Tool 04/04/2021 04/07/2020 12/16/2018  I have been able to laugh and see the funny side of things. 0 0 0  I have looked forward with enjoyment to things. 0 2 0  I have blamed myself unnecessarily when things went wrong. 1 0 1  I have been anxious or worried for no good reason. 1 0 0  I have felt scared or panicky for no good reason. 1 0 0  Things have been getting on top of me. 1 1 1   I have been so unhappy that I have had difficulty sleeping. 1 1 0  I have felt sad or miserable. 0 1 0  I have been so unhappy that I have been crying. 0 0 0  The thought of harming myself has occurred to me. 0 0 0  Edinburgh Postnatal Depression Scale Total 5 5 2       Post partum course:  Patient had an uncomplicated postpartum course.  By time of discharge on PPD#1, her pain was controlled on oral pain medications; she had appropriate lochia and was ambulating, voiding without difficulty and tolerating regular diet.  She was deemed stable for  discharge to home.     Discharge Physical Exam:  BP 135/68 (BP Location: Right Arm)   Pulse 90   Temp 98.3 F (36.8 C) (Oral)   Resp (!) 22   Ht 5\' 7"  (1.702 m)   Wt (!)  153.8 kg   LMP 07/14/2020 (Exact Date)   SpO2 99%   Breastfeeding Unknown   BMI 53.09 kg/m   General: NAD CV: RRR Pulm: CTABL, nl effort ABD: s/nd/nt, fundus firm and below the umbilicus Lochia: moderate Perineum: intact DVT Evaluation: LE non-ttp, no evidence of DVT on exam.  Hemoglobin  Date Value Ref Range Status  04/04/2021 10.6 (L) 12.0 - 15.0 g/dL Final  04/06/2021 63/06/6008 11.1 - 15.9 g/dL Final   HCT  Date Value Ref Range Status  04/04/2021 32.2 (L) 36.0 - 46.0 % Final   Hematocrit  Date Value Ref Range Status  10/25/2018 36.3 34.0 - 46.6 % Final     Disposition: stable, discharge to home. Baby Feeding: formula Baby Disposition: home with mom  Rh Immune globulin given: n/a Rubella vaccine given: immune Varicella vaccine given: immune Tdap vaccine given in AP or PP setting: 01/30/21 Flu vaccine given in AP or PP setting: 03/26/21  Contraception: spouse vasectomy completed  Prenatal Labs:  Blood type/Rh A Pos  Antibody screen neg  Rubella Immune  Varicella Immune  RPR Reactive, T Pallidum reactive, 1:2  HBsAg Neg  HIV NR  GC neg  Chlamydia neg  Genetic screening negative  1 hour GTT 154  3 hour GTT  [phone]  GBS Neg     Plan:  Donna Navarro was discharged to home in good condition. Follow-up appointment with delivering provider in 6 weeks. Follow-up for blood pressure check 1wk.  Discharge Medications: Allergies as of 04/04/2021   No Known Allergies      Medication List     STOP taking these medications    aspirin 81 MG chewable tablet   cyclobenzaprine 10 MG tablet Commonly known as: FLEXERIL   folic acid 1 MG tablet Commonly known as: FOLVITE       TAKE these medications    acetaminophen 325 MG tablet Commonly known as: Tylenol Take 2 tablets (650 mg total) by mouth every 4 (four) hours as needed (for pain scale < 4).   benzocaine-Menthol 20-0.5 % Aero Commonly known as: DERMOPLAST Apply 1 application topically  as needed for irritation (perineal discomfort).   calcium carbonate 500 MG chewable tablet Commonly known as: TUMS - dosed in mg elemental calcium Chew 1 tablet by mouth daily as needed for indigestion or heartburn.   ferrous sulfate 325 (65 FE) MG tablet Take 325 mg by mouth daily with breakfast.   ibuprofen 600 MG tablet Commonly known as: ADVIL Take 1 tablet (600 mg total) by mouth every 6 (six) hours.   NIFEdipine 30 MG 24 hr tablet Commonly known as: ADALAT CC Take 30 mg by mouth 2 (two) times daily. Total of 60 mg per day   prenatal multivitamin Tabs tablet Take 1 tablet by mouth daily at 12 noon.   senna-docusate 8.6-50 MG tablet Commonly known as: Senokot-S Take 2 tablets by mouth daily.   simethicone 80 MG chewable tablet Commonly known as: MYLICON Chew 1 tablet (80 mg total) by mouth as needed for flatulence.   witch hazel-glycerin pad Commonly known as: TUCKS Apply 1 application topically as needed for hemorrhoids.         Follow-up Information     McVey, 09-29-1980, CNM  Follow up in 6 week(s).   Specialty: Obstetrics and Gynecology Why: postpartum Contact information: [address] [place] [address] [phone]         McVey, Prudencio Pair, CNM. Go in 6 day(s).   Specialty: Obstetrics and Gynecology Why: blood pressure check on Wednesday 04/10/21 at 1:30pm Contact information: [address] [place] [address] [phone]                 Signed:  Janyce Llanos, CNM 04/04/2021  2:28 PM

## 2021-04-03 NOTE — Anesthesia Preprocedure Evaluation (Signed)
Anesthesia Evaluation  Patient identified by MRN, date of birth, ID band Patient awake    Reviewed: Allergy & Precautions, H&P , NPO status , Patient's Chart, lab work & pertinent test results, reviewed documented beta blocker date and time   Airway Mallampati: II  TM Distance: >3 FB Neck ROM: full    Dental no notable dental hx. (+) Teeth Intact   Pulmonary neg pulmonary ROS, Current Smoker,    Pulmonary exam normal breath sounds clear to auscultation       Cardiovascular Exercise Tolerance: Good hypertension, negative cardio ROS   Rhythm:regular Rate:Normal     Neuro/Psych negative neurological ROS  negative psych ROS   GI/Hepatic negative GI ROS, Neg liver ROS,   Endo/Other  negative endocrine ROSdiabetes  Renal/GU      Musculoskeletal   Abdominal   Peds  Hematology negative hematology ROS (+)   Anesthesia Other Findings   Reproductive/Obstetrics (+) Pregnancy                             Anesthesia Physical Anesthesia Plan  ASA: 2  Anesthesia Plan: Epidural   Post-op Pain Management:    Induction:   PONV Risk Score and Plan:   Airway Management Planned:   Additional Equipment:   Intra-op Plan:   Post-operative Plan:   Informed Consent: I have reviewed the patients History and Physical, chart, labs and discussed the procedure including the risks, benefits and alternatives for the proposed anesthesia with the patient or authorized representative who has indicated his/her understanding and acceptance.       Plan Discussed with:   Anesthesia Plan Comments:         Anesthesia Quick Evaluation

## 2021-04-03 NOTE — H&P (Signed)
OB History & Physical   History of Present Illness:  Chief Complaint:   HPI:  Donna Navarro is a 35 y.o. [id number] female at [redacted]w[redacted]d dated by LMP.  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   Maternal Medical History:   Past Medical History:  Diagnosis Date   GERD (gastroesophageal reflux disease)    with pregnancy-no meds   Headache    migraines during pregnancy   History of hiatal hernia    large per pt   Hypertension    lost weight and pcp took pt off bp meds 7 months   Incompetent cervix    Irregular periods    Morbid obesity (HCC) 04/30/2017   Obesity affecting pregnancy in second trimester 08/09/2018   Syphilis affecting pregnancy    Ventral hernia without obstruction or gangrene 04/30/2017    Past Surgical History:  Procedure Laterality Date   CERVICAL CERCLAGE N/A 06/17/2018   Procedure: CERCLAGE CERVICAL;  Surgeon: 06/19/2018, MD;  Location: [facility] ORS;  Service: Gynecology;  Laterality: N/A;x 3   CERVICAL CERCLAGE N/A 10/24/2019   Procedure: CERCLAGE CERVICAL - MCDONALD - MERSILENE BAND;  Surgeon: 12/24/2019, MD;  Location: [facility] ORS;  Service: Gynecology;  Laterality: N/A;   CERVICAL CERCLAGE N/A 11/02/2020   Procedure: CERCLAGE CERVICAL;  Surgeon: 11/04/2020 Feliberto Gottron, MD;  Location: [facility] ORS;  Service: Gynecology;  Laterality: N/A;   CHOLECYSTECTOMY      No Known Allergies  Prior to Admission medications   Medication Sig Start Date End Date Taking? Authorizing Provider   acetaminophen (TYLENOL) 325 MG tablet Take 2 tablets (650 mg total) by mouth every 4 (four) hours as needed (for pain scale < 4). 04/07/20  Yes McVey, 04/09/20, CNM  aspirin 81 MG chewable tablet Chew 81 mg by mouth daily.   Yes [provider]  calcium carbonate (TUMS - DOSED IN MG ELEMENTAL CALCIUM) 500 MG chewable tablet Chew 1 tablet by mouth daily as needed for indigestion or heartburn.   Yes [provider]  ferrous sulfate 325 (65 FE) MG tablet Take 325 mg by mouth daily with breakfast.   Yes [provider]  folic acid (FOLVITE) 1 MG tablet Take 1 mg by mouth daily. Pt unsure of dosage   Yes [provider]  NIFEdipine (ADALAT CC) 30 MG 24 hr tablet Take 30 mg by mouth 2 (two) times daily. Total of 60 mg per day   Yes [provider]  cyclobenzaprine (FLEXERIL) 10 MG tablet Take 1 tablet (10 mg total) by mouth 3 (three) times daily as needed for muscle spasms. Patient not taking: Reported on 04/03/2021 01/27/21   McVey, 03/29/21, CNM     Prenatal care site: [facility]    Social History: She  reports that she has never smoked. She has never used smokeless tobacco. She reports that she does not currently use alcohol. She reports that she does not use drugs.  Family History: family history includes Heart disease in her mother.   Review of Systems: A full  review of systems was performed and negative except as noted in the HPI.    Physical Exam:  Vital Signs: BP 130/84 (BP Location: Left Wrist)   Pulse 92   Temp 98.3 F (36.8 C) (Oral)   Resp 16   Ht 5\' 7"  (1.702 m)   Wt (!) 153.8 kg   LMP 07/14/2020 (Exact Date)   BMI 53.09 kg/m   General:   alert, cooperative, and appears stated age  Skin:  normal  Neurologic:    Alert & oriented x 3  Lungs:   clear to auscultation bilaterally  Heart:   regular rate and rhythm, S1, S2 normal, no murmur, click, rub or gallop  Abdomen:   Gravid, protruding hernia present  Pelvis:  Exam  deferred.  FHT:  125 BPM  Presentations: cephalic  Cervix:    Dilation: Closed   Effacement: Long   Station:  -3   Consistency: Scar tissue present   Position: anterior  Extremities: : non-tender, symmetric, no edema bilaterally.  DTRs: +2    EFW: 3157g  Results for orders placed or performed during the hospital encounter of 04/03/21 (from the past 24 hour(s))  CBC     Status: Abnormal   Collection Time: 04/03/21 12:36 AM  Result Value Ref Range   WBC 15.8 (H) 4.0 - 10.5 K/uL   RBC 4.07 3.87 - 5.11 MIL/uL   Hemoglobin 11.5 (L) 12.0 - 15.0 g/dL   HCT 06/03/21 (L) 20.2 - 54.2 %   MCV 84.5 80.0 - 100.0 fL   MCH 28.3 26.0 - 34.0 pg   MCHC 33.4 30.0 - 36.0 g/dL   RDW 70.6 23.7 - 62.8 %   Platelets 296 150 - 400 K/uL   nRBC 0.0 0.0 - 0.2 %  Type and screen     Status: None   Collection Time: 04/03/21 12:36 AM  Result Value Ref Range   ABO/RH(D) A POS    Antibody Screen NEG    Sample Expiration      04/06/2021,2359 Performed at [facility], [address] [place], [place]   Comprehensive metabolic panel     Status: Abnormal   Collection Time: 04/03/21 12:36 AM  Result Value Ref Range   Sodium 137 135 - 145 mmol/L   Potassium 3.7 3.5 - 5.1 mmol/L   Chloride 108 98 - 111 mmol/L   CO2 21 (L) 22 - 32 mmol/L   Glucose, Bld 172 (H) 70 - 99 mg/dL   BUN 8 6 - 20 mg/dL   Creatinine, Ser 06/03/21 0.44 - 1.00 mg/dL   Calcium 9.2 8.9 - 0.73 mg/dL   Total Protein 6.9 6.5 - 8.1 g/dL   Albumin 2.9 (L) 3.5 - 5.0 g/dL   AST 11 (L) 15 - 41 U/L   ALT 9 0 - 44 U/L   Alkaline Phosphatase 137 (H) 38 - 126 U/L   Total Bilirubin 0.3 0.3 - 1.2 mg/dL   GFR, Estimated 71.0 >62 mL/min   Anion gap 8 5 - 15  Resp Panel by RT-PCR (Flu A&B, Covid) Nasopharyngeal Swab     Status: None   Collection Time: 04/03/21 12:37 AM   Specimen: Nasopharyngeal Swab; Nasopharyngeal(NP) swabs in vial transport medium  Result Value Ref Range   SARS Coronavirus 2 by RT PCR NEGATIVE NEGATIVE    Influenza A by PCR NEGATIVE NEGATIVE   Influenza B by PCR NEGATIVE NEGATIVE  Protein / creatinine ratio, urine     Status: None   Collection Time:  04/03/21  1:28 AM  Result Value Ref Range   Creatinine, Urine 227 mg/dL   Total Protein, Urine 15 mg/dL   Protein Creatinine Ratio 0.07 0.00 - 0.15 mg/mg[Cre]    Pertinent Results:  Prenatal Labs: Blood type/Rh A Pos  Antibody screen neg  Rubella Immune  Varicella Immune  RPR Reactive, T Pallidum reactive, 1:2  HBsAg Neg  HIV NR  GC neg  Chlamydia neg  Genetic screening negative  1 hour GTT 154  3 hour GTT  95-121-149-120  GBS Neg   FHT: FHR: 125 bpm, variability: moderate,  accelerations:  Present,  decelerations:  Absent Category/reactivity:  Category I TOCO: irregular, mild SVE: Dilation: Closed /   / Station: -3    Cephalic by leopolds  No results found.   Assessment:  MARKESHA HANNIG is a 35 y.o. [phone] female at [redacted]w[redacted]d with history of incompetent cervix, GHTN, AMA,morbid obesity and covid in early pregnancy.  Plan:  1. Admit to Labor & Delivery; consents reviewed and obtained  2. Fetal Well being  - Fetal Tracing: Category 1 - GBS-neg - Presentation: Cephalic confirmed by SVE   3. Routine OB: - Prenatal labs reviewed, as above - Rh pos - CBC & T&S on admit - Clear fluids, IVF.saline lock  4. Induction of Labor -  Contractions external toco in place -  Plan for induction with Cytotec, Cervical Foley, Pitocin -  Plan for continuous fetal monitoring  -  Maternal pain control as desired: IVPM, nitrous, regional anesthesia - Anticipate vaginal delivery  5. Post Partum Planning: - Infant feeding: Bottle - Contraception: Husband has vasectomy, desires BTL  Chari Manning, CNM 04/03/2021 7:04 AM

## 2021-04-03 NOTE — Progress Notes (Signed)
Labor Progress Note  Donna Navarro is a 35 y.o. [id number] at [redacted]w[redacted]d by LMP admitted for induction of labor due to [name], morbid obesity, hx incompetent cervix.  Subjective: feeling mild cramping.   Objective: BP 96/78 (BP Location: Left Arm)   Pulse 90   Temp (!) 97.5 F (36.4 C) (Oral)   Resp 18   Ht 5\' 7"  (1.702 m)   Wt (!) 153.8 kg   LMP 07/14/2020 (Exact Date)   BMI 53.09 kg/m  Notable VS details: reviewed  Fetal Assessment: FHT:  FHR: 135 bpm, variability: moderate,  accelerations:  Present,  decelerations:  Absent Category/reactivity:  Category I UC:   regular, every 2-4 minutes, s/p 2 doses of cytotec.  SVE:   cervix closed with firm scar tissue noted. Attempted to visualize cervix with speculum, unable to clearly see cx due to lax vaginal tissue. Digitally attempted to break scar tissue, pt tolerated well, cervix FTP with thick scar band noted, external cervix 3-4cm and soft/50% effaced.  - small amount bloody show noted.  - initially presenting part noted but not well applied, during exam, presenting part moved and small parts noted in LUS.  - Bedside 07/16/2020 performed, cephalic presentation, oblique, with forearm noted in front of fetal head.   Membrane status:intact Amniotic color: n/a  Labs: Lab Results  Component Value Date   WBC 15.8 (H) 04/03/2021   HGB 11.5 (L) 04/03/2021   HCT 34.4 (L) 04/03/2021   MCV 84.5 04/03/2021   PLT 296 04/03/2021    Assessment / Plan: IOL at 37.4 with compound presentation and poorly applied vertex.  CHTN, grand multip  Labor: s/p 2 doses of cytotec, attempted to break scar tissue. Will start low dose Pitocin. Discussed compound presentation with Dr 06/03/2021, will continue to IOL, monitor closely. Pt is high surgical risk due to BMI, large ventral hernia.  Preeclampsia:   no e/o Pre-E. Procardia 60mg  XL home medication, not taken this am. BP remain WNL.  Fetal Wellbeing:  Category I Pain Control:  Labor support without medications,  plans epidural.  I/D:   GBS neg Anticipated MOD:  NSVD  [name], CNM 04/03/2021, 9:03 AM

## 2021-04-03 NOTE — Progress Notes (Signed)
Labor Progress Note  Donna Navarro is a 35 y.o. [id number] at [redacted]w[redacted]d by LMP admitted for induction of labor due to [facility], morbid obesity, hx incompetent cervix.  Subjective: feeling more painful UCs.   Objective: BP (!) 155/85   Pulse 79   Temp (!) 97.5 F (36.4 C) (Oral)   Resp 18   Ht 5\' 7"  (1.702 m)   Wt (!) 153.8 kg   LMP 07/14/2020 (Exact Date)   BMI 53.09 kg/m  Notable VS details: reviewed  Fetal Assessment: FHT:  FHR: 135 bpm, variability: moderate,  accelerations:  Present,  decelerations:  Absent Category/reactivity:  Category I UC:   regular, every 2-4 minutes, Pitocin at 61mu/min SVE:  4-5/75/-2, soft/midposition  Membrane status:intact Amniotic color: n/a  Labs: Lab Results  Component Value Date   WBC 15.8 (H) 04/03/2021   HGB 11.5 (L) 04/03/2021   HCT 34.4 (L) 04/03/2021   MCV 84.5 04/03/2021   PLT 296 04/03/2021    Assessment / Plan: IOL at 37.4 with  CHTN, grand multip Active labor now.   Labor: s/p 2 doses of cytotec, attempted to break scar tissue. S/p pitocin and cervical change.  Preeclampsia:   no e/o Pre-E. Procardia 60mg  XL home medication, not taken this am. BP remain WNL.  Fetal Wellbeing:  Category I Pain Control:  Labor support without medications, plans epidural.  I/D:   GBS neg Anticipated MOD:  NSVD  06/03/2021 Eero Dini, CNM 04/03/2021, 3:02 PM

## 2021-04-03 NOTE — Progress Notes (Signed)
Pt c/o bilateral hip pain and lower back pain. IV pain medication given and RN helped patient into Side Lying-Release on both sides. Pt states pain was relieved after position changes and chose to stay on her right side for a little longer. RN will encourage and assist with position changes frequently as patient tolerates.

## 2021-04-04 LAB — CBC
HCT: 32.2 % — ABNORMAL LOW (ref 36.0–46.0)
Hemoglobin: 10.6 g/dL — ABNORMAL LOW (ref 12.0–15.0)
MCH: 27.4 pg (ref 26.0–34.0)
MCHC: 32.9 g/dL (ref 30.0–36.0)
MCV: 83.2 fL (ref 80.0–100.0)
Platelets: 261 10*3/uL (ref 150–400)
RBC: 3.87 MIL/uL (ref 3.87–5.11)
RDW: 15 % (ref 11.5–15.5)
WBC: 18.2 10*3/uL — ABNORMAL HIGH (ref 4.0–10.5)
nRBC: 0 % (ref 0.0–0.2)

## 2021-04-04 LAB — T.PALLIDUM AB, TOTAL: T Pallidum Abs: REACTIVE — AB

## 2021-04-04 MED ORDER — IBUPROFEN 600 MG PO TABS: 600.0000 mg | ORAL_TABLET | Freq: Four times a day (QID) | ORAL | 0 refills | Status: DC

## 2021-04-04 MED ORDER — BENZOCAINE-MENTHOL 20-0.5 % EX AERO: 1.0000 "application " | INHALATION_SPRAY | CUTANEOUS | Status: DC | PRN

## 2021-04-04 MED ORDER — WITCH HAZEL-GLYCERIN EX PADS: 1.0000 "application " | MEDICATED_PAD | CUTANEOUS | 12 refills | Status: DC | PRN

## 2021-04-04 MED ORDER — PRENATAL MULTIVITAMIN CH: 1.0000 | ORAL_TABLET | Freq: Every day | ORAL | Status: DC

## 2021-04-04 MED ORDER — SIMETHICONE 80 MG PO CHEW: 80.0000 mg | CHEWABLE_TABLET | ORAL | 0 refills | Status: DC | PRN

## 2021-04-04 MED ORDER — SENNOSIDES-DOCUSATE SODIUM 8.6-50 MG PO TABS: 2.0000 | ORAL_TABLET | Freq: Every day | ORAL | Status: DC

## 2021-04-04 NOTE — Progress Notes (Signed)
Called on call CNM again due to elevated BP of 140/72 at recheck. Awaiting new orders. Will continue to monitor patient closely.  Peter Minium 04/04/2021 2:58 PM

## 2021-04-04 NOTE — Progress Notes (Signed)
Patient had diastolic BP of 90 at noon vitals check. This RN rechecked BP over an hour later and results were 135/68. Called CNM on call and made her aware. RN to recheck BP in one hour and call CNM back if BP still elevated. Will continue to monitor patient closely.   Peter Minium 04/04/2021 1:43 PM

## 2021-04-04 NOTE — Progress Notes (Signed)
No new orders placed for BP. Per CNM will continue to monitor and educate patient on severe ranges and when to call MD when discharged. Postpartum HTN handout given to patient along with BP logging sheet.  Peter Minium 04/04/2021 3:29 PM

## 2021-04-04 NOTE — Progress Notes (Signed)
Patient discharged home with infant. Rx sent to pharmacy. PP instructions given and reviewed with patient. Patient verbalized understanding. Patient escorted out by auxiliary.   Peter Minium 04/04/2021 4:26 PM

## 2021-04-04 NOTE — Anesthesia Postprocedure Evaluation (Signed)
Anesthesia Post Note  Patient: Donna Navarro  Procedure(s) Performed: AN AD HOC LABOR EPIDURAL  Anesthesia Type: Epidural Anesthetic complications: no   No notable events documented.   Last Vitals:  Vitals:   04/04/21 0313 04/04/21 0736  BP: (!) 114/51 126/63  Pulse: 79 73  Resp: 20 20  Temp: (!) 36.4 C 36.9 C  SpO2: 97% 98%    Last Pain:  Vitals:   04/04/21 0745  TempSrc:   PainSc: 0-No pain                 Starling Manns

## 2021-04-29 ENCOUNTER — Other Ambulatory Visit: Payer: Medicaid Other

## 2021-11-01 ENCOUNTER — Ambulatory Visit: Payer: Medicaid Other | Admitting: Urology

## 2022-01-13 ENCOUNTER — Other Ambulatory Visit: Payer: Medicaid Other

## 2022-01-18 ENCOUNTER — Encounter: Payer: Self-pay | Admitting: Emergency Medicine

## 2022-01-18 ENCOUNTER — Other Ambulatory Visit: Payer: Self-pay

## 2022-01-18 ENCOUNTER — Emergency Department: Payer: Medicaid Other

## 2022-01-18 ENCOUNTER — Emergency Department
Admission: EM | Admit: 2022-01-18 | Discharge: 2022-01-18 | Disposition: A | Discharge status: Home / Self Care | Payer: Medicaid Other | Attending: Emergency Medicine | Admitting: Emergency Medicine

## 2022-01-18 DIAGNOSIS — D72829 Elevated white blood cell count, unspecified: Secondary | ICD-10-CM | POA: Diagnosis not present

## 2022-01-18 DIAGNOSIS — K439 Ventral hernia without obstruction or gangrene: Secondary | ICD-10-CM | POA: Diagnosis not present

## 2022-01-18 DIAGNOSIS — R739 Hyperglycemia, unspecified: Secondary | ICD-10-CM

## 2022-01-18 DIAGNOSIS — K769 Liver disease, unspecified: Secondary | ICD-10-CM

## 2022-01-18 DIAGNOSIS — R112 Nausea with vomiting, unspecified: Secondary | ICD-10-CM | POA: Insufficient documentation

## 2022-01-18 LAB — URINALYSIS, COMPLETE (UACMP) WITH MICROSCOPIC
Bacteria, UA: NONE SEEN
Glucose, UA: NEGATIVE mg/dL
Ketones, ur: 80 mg/dL — AB
Nitrite: NEGATIVE
Protein, ur: 100 mg/dL — AB
Specific Gravity, Urine: 1.028 (ref 1.005–1.030)
pH: 5 (ref 5.0–8.0)

## 2022-01-18 LAB — CBC WITH DIFFERENTIAL/PLATELET
Abs Immature Granulocytes: 0.03 10*3/uL (ref 0.00–0.07)
Basophils Absolute: 0.1 10*3/uL (ref 0.0–0.1)
Basophils Relative: 0 %
Eosinophils Absolute: 0.1 10*3/uL (ref 0.0–0.5)
Eosinophils Relative: 1 %
HCT: 37.8 % (ref 36.0–46.0)
Hemoglobin: 12 g/dL (ref 12.0–15.0)
Immature Granulocytes: 0 %
Lymphocytes Relative: 24 %
Lymphs Abs: 3 10*3/uL (ref 0.7–4.0)
MCH: 26.7 pg (ref 26.0–34.0)
MCHC: 31.7 g/dL (ref 30.0–36.0)
MCV: 84.2 fL (ref 80.0–100.0)
Monocytes Absolute: 0.9 10*3/uL (ref 0.1–1.0)
Monocytes Relative: 7 %
Neutro Abs: 8.6 10*3/uL — ABNORMAL HIGH (ref 1.7–7.7)
Neutrophils Relative %: 68 %
Platelets: 289 10*3/uL (ref 150–400)
RBC: 4.49 MIL/uL (ref 3.87–5.11)
RDW: 16.8 % — ABNORMAL HIGH (ref 11.5–15.5)
WBC: 12.6 10*3/uL — ABNORMAL HIGH (ref 4.0–10.5)
nRBC: 0 % (ref 0.0–0.2)

## 2022-01-18 LAB — COMPREHENSIVE METABOLIC PANEL
ALT: 25 U/L (ref 0–44)
AST: 18 U/L (ref 15–41)
Albumin: 3.8 g/dL (ref 3.5–5.0)
Alkaline Phosphatase: 77 U/L (ref 38–126)
Anion gap: 11 (ref 5–15)
BUN: 11 mg/dL (ref 6–20)
CO2: 24 mmol/L (ref 22–32)
Calcium: 9.2 mg/dL (ref 8.9–10.3)
Chloride: 108 mmol/L (ref 98–111)
Creatinine, Ser: 0.79 mg/dL (ref 0.44–1.00)
GFR, Estimated: >60 mL/min (ref >60)
Glucose, Bld: 97 mg/dL (ref 70–99)
Potassium: 3.5 mmol/L (ref 3.5–5.1)
Sodium: 143 mmol/L (ref 135–145)
Total Bilirubin: 1.1 mg/dL (ref 0.3–1.2)
Total Protein: 7.6 g/dL (ref 6.5–8.1)

## 2022-01-18 LAB — PROCALCITONIN: Procalcitonin: <0.1 ng/mL

## 2022-01-18 LAB — LIPASE, BLOOD: Lipase: 39 U/L (ref 11–51)

## 2022-01-18 LAB — MAGNESIUM: Magnesium: 2 mg/dL (ref 1.7–2.4)

## 2022-01-18 LAB — HCG, QUANTITATIVE, PREGNANCY: hCG, Beta Chain, Quant, S: <1 m[IU]/mL (ref <5)

## 2022-01-18 LAB — PHOSPHORUS: Phosphorus: 3.1 mg/dL (ref 2.5–4.6)

## 2022-01-18 MED ORDER — ONDANSETRON HCL 4 MG/2ML IJ SOLN
4.0000 mg | Freq: Once | INTRAMUSCULAR | Status: AC
  Administered 2022-01-18: 4 mg via INTRAVENOUS

## 2022-01-18 MED ORDER — IOHEXOL 300 MG/ML  SOLN
100.0000 mL | Freq: Once | INTRAMUSCULAR | Status: AC | PRN
  Administered 2022-01-18: 100 mL via INTRAVENOUS

## 2022-01-18 MED ORDER — METOCLOPRAMIDE HCL 10 MG PO TABS: 10.0000 mg | ORAL_TABLET | Freq: Three times a day (TID) | ORAL | 0 refills | Status: DC

## 2022-01-18 MED ORDER — METOCLOPRAMIDE HCL 10 MG PO TABS
10.0000 mg | ORAL_TABLET | Freq: Once | ORAL | Status: AC
Start: 2022-01-18 — End: 2022-01-18
  Administered 2022-01-18: 10 mg via ORAL
  Filled 2022-01-18: qty 1

## 2022-01-18 MED ORDER — LACTATED RINGERS IV BOLUS
2000.0000 mL | Freq: Once | INTRAVENOUS | Status: AC
  Administered 2022-01-18: 2000 mL via INTRAVENOUS

## 2022-01-18 NOTE — ED Triage Notes (Signed)
Pt via POV from home. Pt c/o weakness, nausea, and vomiting since the surgery but states that got worse last 2 days. Pt had a gastric sleeve done 1 month ago at [facility]. Denies any pain. Pt is A&Ox4 and NAD.

## 2022-01-18 NOTE — ED Provider Notes (Signed)
[facility] Provider Note    Event Date/Time   First MD Initiated Contact with Patient 01/18/22 [phone]     (approximate)   History   Post-op Problem and Emesis   HPI  Donna Navarro is a 36 y.o. female with past medical history of GERD, migraine headaches, HTN, ventral hernia, and morbid obesity s/p sleeve gastrectomy (laparoscopic) with Dr. Adolphus Birchwood on 12/18/21 [facility]) who presents for evaluation of ongoing and worsening nausea and vomiting.  Patient states he has been taking prescribed ODT Zofran but does not feel it is helping at all.  She states she is will take a couple sips of water or try a popsicle or some Powerade but will immediately vomited up.  She denies any abdominal pain but endorses very little stool output stating she is only had 2 bowel movements over almost a month.  She denies any urinary symptoms, fevers, chest pain, cough, shortness of breath, back pain, headache, earache, sore throat rash or extremity pain.  She denies any tobacco abuse, EtOH use or illicit drug use.  He states she spoke to a nurse at the bariatric clinic and told to come to emergency room.  She states she has a follow-up appointment with her bariatric surgeon on 8/5.   Past Medical History:  Diagnosis Date   GERD (gastroesophageal reflux disease)    with pregnancy-no meds   Headache    migraines during pregnancy   History of hiatal hernia    large per pt   Hypertension    lost weight and pcp took pt off bp meds 7 months   Incompetent cervix    Irregular periods    Morbid obesity (HCC) 04/30/2017   Obesity affecting pregnancy in second trimester 08/09/2018   Syphilis affecting pregnancy    Ventral hernia without obstruction or gangrene 04/30/2017   Past Surgical History:  Procedure Laterality Date   CERVICAL CERCLAGE N/A 06/17/2018   Procedure: CERCLAGE CERVICAL;  Surgeon: Natale Milch, MD;  Location: [facility] ORS;  Service: Gynecology;  Laterality:  N/A;x 3   CERVICAL CERCLAGE N/A 10/24/2019   Procedure: CERCLAGE CERVICAL - M[facility]ONALD - MERSILENE BAND;  Surgeon: Suzy Bouchard, MD;  Location: [facility] ORS;  Service: Gynecology;  Laterality: N/A;   CERVICAL CERCLAGE N/A 11/02/2020   Procedure: CERCLAGE CERVICAL;  Surgeon: Feliberto Gottron Ihor Austin, MD;  Location: [facility] OR[facility];  Service: Gynecology;  Laterality: N/A;   CHOLECYSTECTOMY        Physical Exam  Triage Vital Signs: ED Triage Vitals  Enc Vitals Group     BP      Pulse      Resp      Temp      Temp src      SpO2      Weight      Height      Head Circumference      Peak Flow      Pain Score      Pain Loc      Pain Edu?      Excl. in GC?     Most recent vital signs: Vitals:   01/18/22 0959 01/18/22 1043  BP: 121/77 125/70  Pulse: 61 65  Resp: 18 17  Temp: 98.5 F (36.9 C) 98.4 F (36.9 C)  SpO2: 98% 98%    General: Awake, no distress.  CV:  Prolonged capillary refill in the digits.  2+ radial pulses.  Slightly bradycardic.  No significant murmur Resp:  Normal effort.  Clear bilaterally. Abd:  No distention.  Large reducible ventral abdominal wall hernia.  Nontender throughout. Other:  Dry mucous membranes.   ED Results / Procedures / Treatments  Labs (all labs ordered are listed, but only abnormal results are displayed) Labs Reviewed  CBC WITH DIFFERENTIAL/PLATELET - Abnormal; Notable for the following components:      Result Value   WBC 12.6 (*)    RDW 16.8 (*)    Neutro Abs 8.6 (*)    All other components within normal limits  URINALYSIS, COMPLETE (UACMP) WITH MICROSCOPIC - Abnormal; Notable for the following components:   Color, Urine AMBER (*)    APPearance HAZY (*)    Hgb urine dipstick SMALL (*)    Bilirubin Urine SMALL (*)    Ketones, ur 80 (*)    Protein, ur 100 (*)    Leukocytes,Ua MODERATE (*)    All other components within normal limits  COMPREHENSIVE METABOLIC PANEL  LIPASE, BLOOD  MAGNESIUM  PHOSPHORUS  HCG, QUANTITATIVE,  PREGNANCY  PROCALCITONIN     EKG  EKG is remarkable for sinus rhythm with a ventricular rate of 58, normal axis, unremarkable intervals without clear evidence of acute ischemia or significant arrhythmia.   RADIOLOGY  CT abdomen pelvis on interpretation shows fairly large ventral abdominal hernia with small loop of bowel but there is no evidence of obstruction.  I do see patient sleeve gastrectomy without evidence of thickening or perforation or abscess.  No other acute process in the abdomen or pelvis.  I reviewed radiology interpretation and agree to findings of 3 hypodense liver lesions somewhat nonspecific as well as sequelae of sleeve gastrectomy without any abnormalities noted and chronic ventral abdominal hernia that is increased now 16.5 cm diameter with abdominal transverse colon but no evidence of incarceration or obstruction.  PROCEDURES:  Critical Care performed: No  .1-3 Lead EKG Interpretation  Performed by: Gilles Chiquito, MD Authorized by: Gilles Chiquito, MD     Interpretation: non-specific     ECG rate assessment: bradycardic     Rhythm: sinus rhythm     Ectopy: none     Conduction: normal     The patient is on the cardiac monitor to evaluate for evidence of arrhythmia and/or significant heart rate changes.   MEDICATIONS ORDERED IN ED: Medications  lactated ringers bolus 2,000 mL (2,000 mLs Intravenous New Bag/Given 01/18/22 0832)  ondansetron (ZOFRAN) injection 4 mg (4 mg Intravenous Given 01/18/22 0833)  iohexol (OMNIPAQUE) 300 MG/ML solution 100 mL (100 mLs Intravenous Contrast Given 01/18/22 0959)  metoCLOPramide (REGLAN) tablet 10 mg (10 mg Oral Given 01/18/22 1041)     IMPRESSION / MDM / ASSESSMENT AND PLAN / ED COURSE  I reviewed the triage vital signs and the nursing notes. Patient's presentation is most consistent with acute presentation with potential threat to life or bodily function.                               Differential diagnosis  includes, but is not limited to SBO, gastritis, ileus, symptomatic constipation, electrolyte derangements possible ongoing effects related to recent surgery.  She does not have any pain or tenderness or fevers or diarrhea or back pain or urinary symptoms at this time to suggest appendicitis, cholecystitis, pancreatitis, diverticulitis or other emergent infectious process.  She denies any illicit drug use or EtOH use and I have low suspicion that these are contributing to her symptoms at this time.  EKG is remarkable for sinus rhythm with a ventricular rate of 58, normal axis, unremarkable intervals without clear evidence of acute ischemia or significant arrhythmia.  CT abdomen pelvis on interpretation shows fairly large ventral abdominal hernia with small loop of bowel but there is no evidence of obstruction.  I do see patient sleeve gastrectomy without evidence of thickening or perforation or abscess.  No other acute process in the abdomen or pelvis.  I reviewed radiology interpretation and agree to findings of 3 hypodense liver lesions somewhat nonspecific as well as sequelae of sleeve gastrectomy without any abnormalities noted and chronic ventral abdominal hernia that is increased now 16.5 cm diameter with abdominal transverse colon but no evidence of incarceration or obstruction.  hCG is negative.  Procalcitonin negative.  BMP without any significant electrolyte or metabolic derangements.  Glucose is 172.  Lipase WNL not suggestive of pancreatitis.  Magnesium WNL.  Phosphorus WNL.  Appears contaminated with squamous epithelial cells but does have some leukocyte esterase and ketones and protein.  There is also a small hemoglobin bilirubin.  Given patient's not having any symptoms with suspicion for clinically significant cystitis at this time.  CBC shows slight leukocytosis with WBC count of 12.6 compared to 18.29 months ago without any anemia or abnormal platelets.  Unclear etiology at this time as she  has no other infectious symptoms.  Patient given some IV fluids IV Zofran and some Reglan is able tolerate p.o.  At this point I do not think she requires further emergency evaluation or admission and can follow-up with her bariatric surgeon.  Will write Rx for Reglan she states feels the Zofran is not helping that much.  Discussed returning for any new or worsening symptoms.  Advised of incidental findings of liver lesions and recommendation for nonemergent MRI follow-up as well as have her blood sugar rechecked.  Discharged stable condition for strict and precautions advised and discussed.     FINAL CLINICAL IMPRESSION(S) / ED DIAGNOSES   Final diagnoses:  Nausea and vomiting, unspecified vomiting type  Ventral hernia without obstruction or gangrene  Liver lesion  Hyperglycemia     Rx / DC Orders   ED Discharge Orders          Ordered    metoCLOPramide (REGLAN) 10 MG tablet  3 times daily with meals        01/18/22 1041             Note:  This document was prepared using Dragon voice recognition software and may include unintentional dictation errors.   Lucrezia Starch, MD 01/18/22 1045

## 2022-01-18 NOTE — Discharge Instructions (Addendum)
Your CT today showed: IMPRESSION: 1. At least 3 hypodense liver lesions are new since 2020 and indeterminate, ranging from 9 mm to 30 mm. Recommend follow-up Abdomen MRI (Liver protocol without and with contrast) after resolution of the acute illness when the patient can reliably breath hold.   2. Sequelae of sleeve gastrectomy with no adverse features. A large chronic ventral abdominal hernia has increased since 2020, now up to 16.5 cm diameter, with herniated redundant transverse colon. No incarceration. No bowel obstruction.

## 2022-02-14 ENCOUNTER — Other Ambulatory Visit: Payer: Medicaid Other

## 2022-06-19 ENCOUNTER — Other Ambulatory Visit: Payer: Self-pay

## 2022-06-19 ENCOUNTER — Emergency Department
Admission: EM | Admit: 2022-06-19 | Discharge: 2022-06-19 | Discharge status: Against Medical Advice | Payer: Medicaid Other | Attending: Emergency Medicine | Admitting: Emergency Medicine

## 2022-06-19 DIAGNOSIS — K429 Umbilical hernia without obstruction or gangrene: Secondary | ICD-10-CM | POA: Insufficient documentation

## 2022-06-19 DIAGNOSIS — Z5321 Procedure and treatment not carried out due to patient leaving prior to being seen by health care provider: Secondary | ICD-10-CM | POA: Insufficient documentation

## 2022-06-19 LAB — COMPREHENSIVE METABOLIC PANEL
ALT: 12 U/L (ref 0–44)
AST: 14 U/L — ABNORMAL LOW (ref 15–41)
Albumin: 4.5 g/dL (ref 3.5–5.0)
Alkaline Phosphatase: 83 U/L (ref 38–126)
Anion gap: 10 (ref 5–15)
BUN: 15 mg/dL (ref 6–20)
CO2: 23 mmol/L (ref 22–32)
Calcium: 9.5 mg/dL (ref 8.9–10.3)
Chloride: 108 mmol/L (ref 98–111)
Creatinine, Ser: 0.73 mg/dL (ref 0.44–1.00)
GFR, Estimated: >60 mL/min (ref >60)
Glucose, Bld: 121 mg/dL — ABNORMAL HIGH (ref 70–99)
Potassium: 3.1 mmol/L — ABNORMAL LOW (ref 3.5–5.1)
Sodium: 141 mmol/L (ref 135–145)
Total Bilirubin: 1.3 mg/dL — ABNORMAL HIGH (ref 0.3–1.2)
Total Protein: 8.5 g/dL — ABNORMAL HIGH (ref 6.5–8.1)

## 2022-06-19 LAB — CBC
HCT: 42.6 % (ref 36.0–46.0)
Hemoglobin: 13.8 g/dL (ref 12.0–15.0)
MCH: 28 pg (ref 26.0–34.0)
MCHC: 32.4 g/dL (ref 30.0–36.0)
MCV: 86.4 fL (ref 80.0–100.0)
Platelets: 328 10*3/uL (ref 150–400)
RBC: 4.93 MIL/uL (ref 3.87–5.11)
RDW: 14.8 % (ref 11.5–15.5)
WBC: 20.1 10*3/uL — ABNORMAL HIGH (ref 4.0–10.5)
nRBC: 0 % (ref 0.0–0.2)

## 2022-06-19 LAB — LIPASE, BLOOD: Lipase: 30 U/L (ref 11–51)

## 2022-06-19 NOTE — ED Triage Notes (Signed)
Pt reports has an umbilical hernia and has been having trouble with it for a couple of weeks so went to [facility] and they advised that she come to the ED for evaluation for her hernia.

## 2022-08-22 IMAGING — US US MFM OB COMPLETE +14 WKS
1 series · 13 of 28 positions shown · non-contrast
Comparison: none

[Series 1: us mfm ob complete +14 wks · 44 acquisitions, 13 frames shown]
[im 2/44]
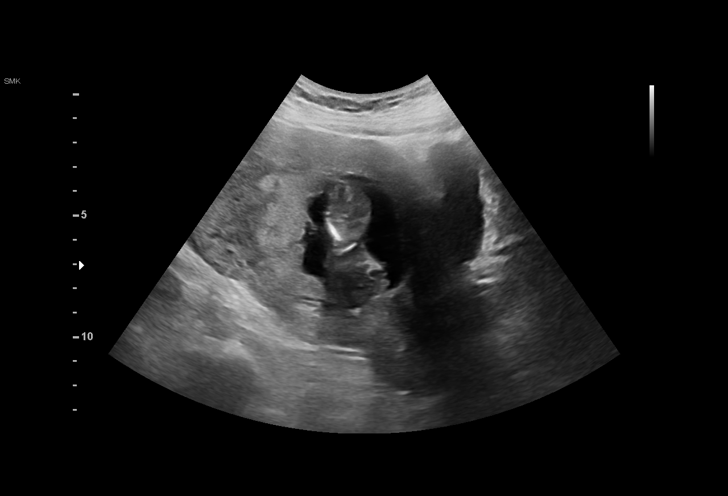
[im 5/44]
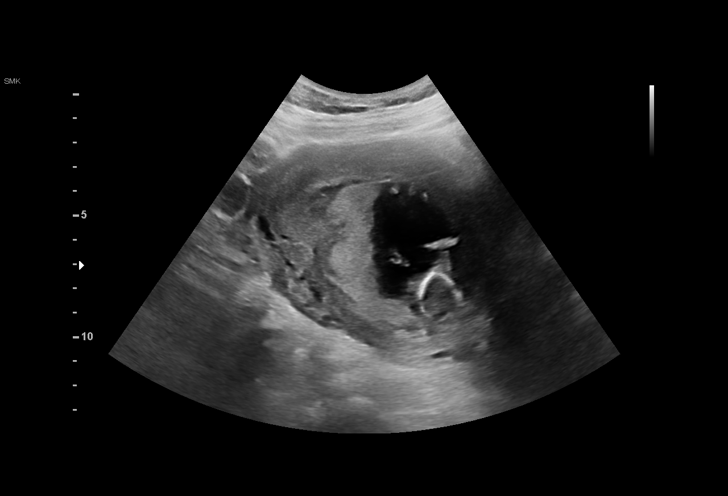
[im 8/44]
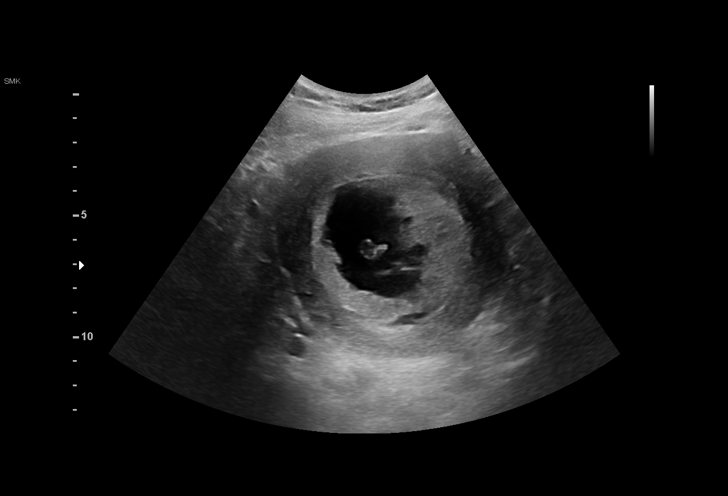
[im 12/44]
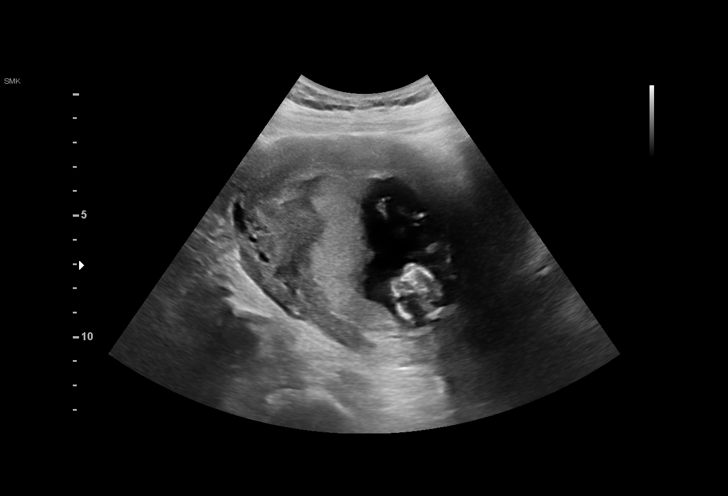
[im 15/44]
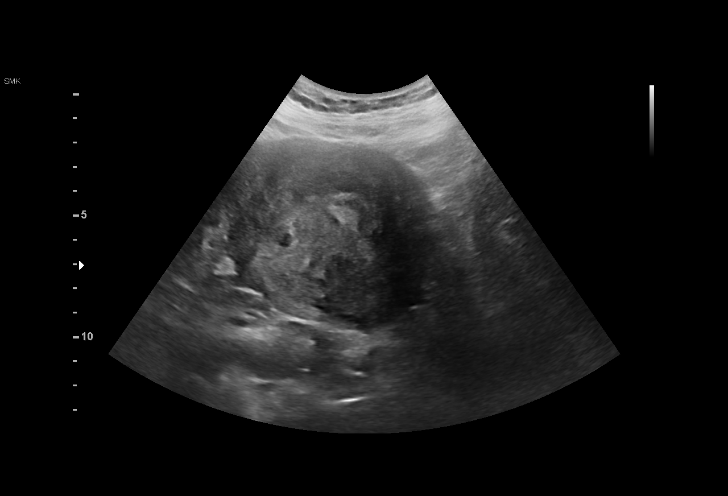
[im 18/44]
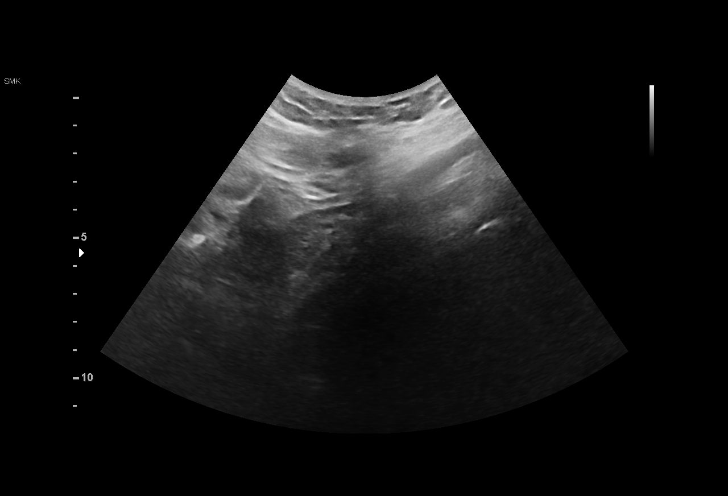
[im 23/44]
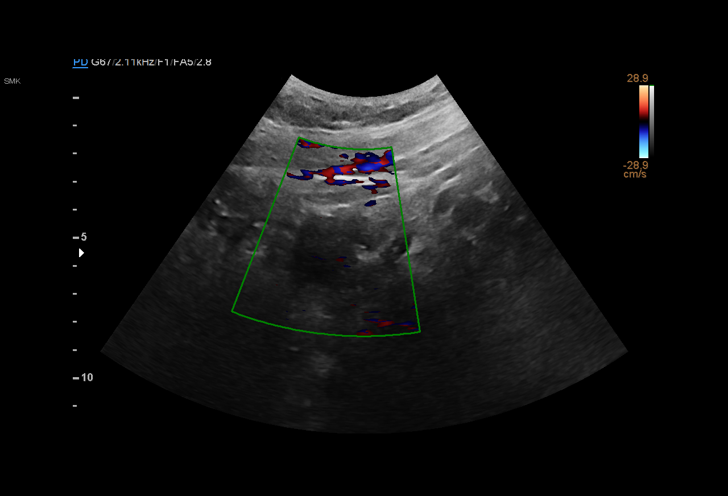
[im 26/44]
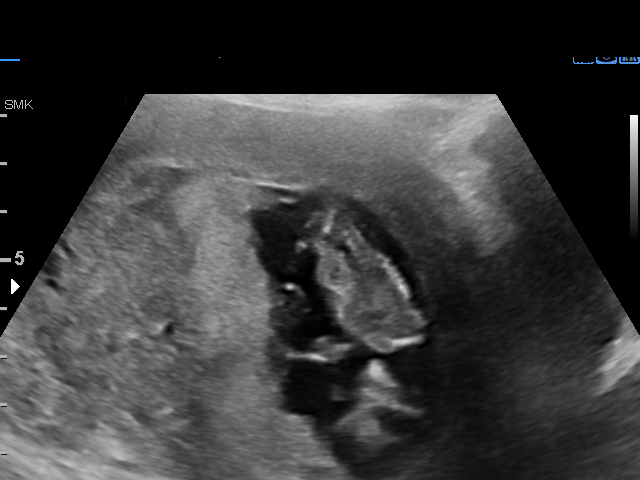
[im 29/44]
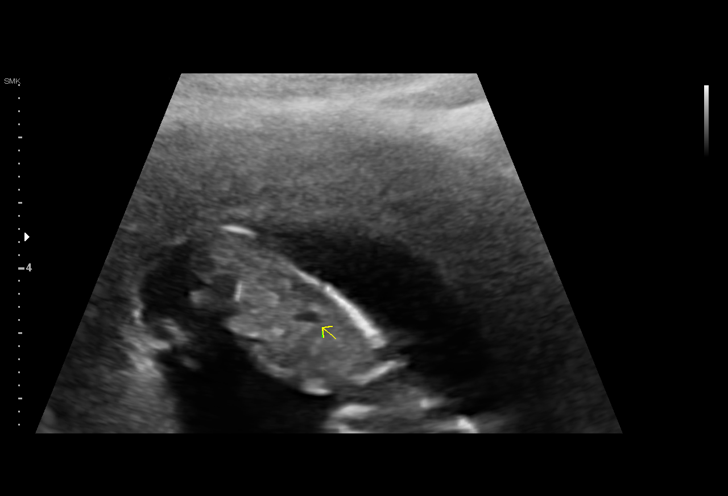
[im 32/44]
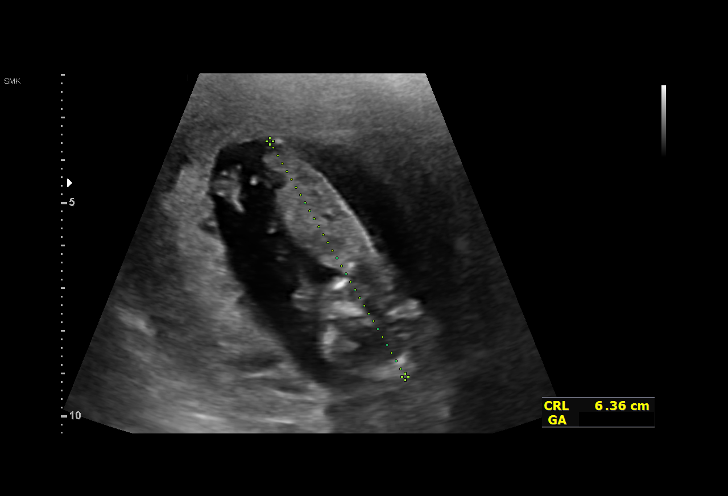
[im 36/44]
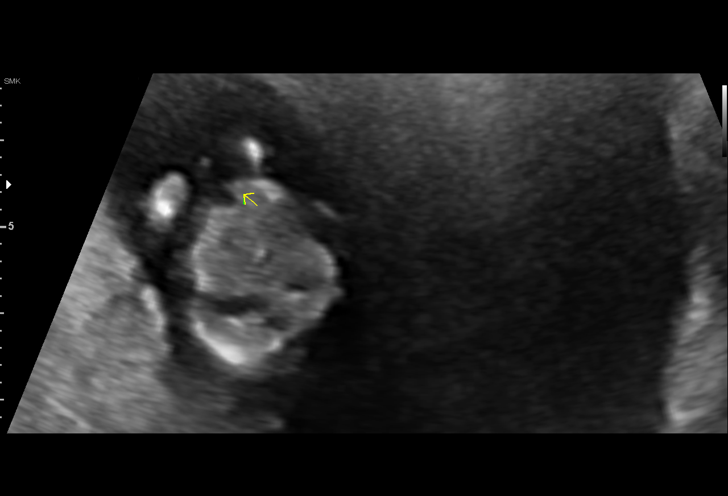
[im 39/44]
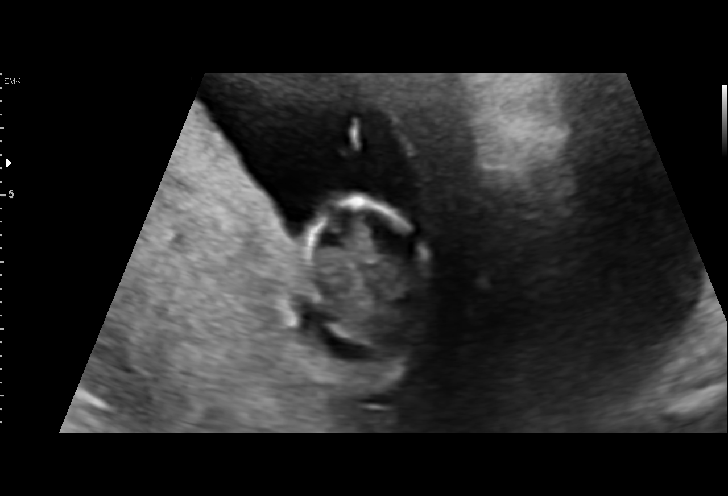
[im 42/44]
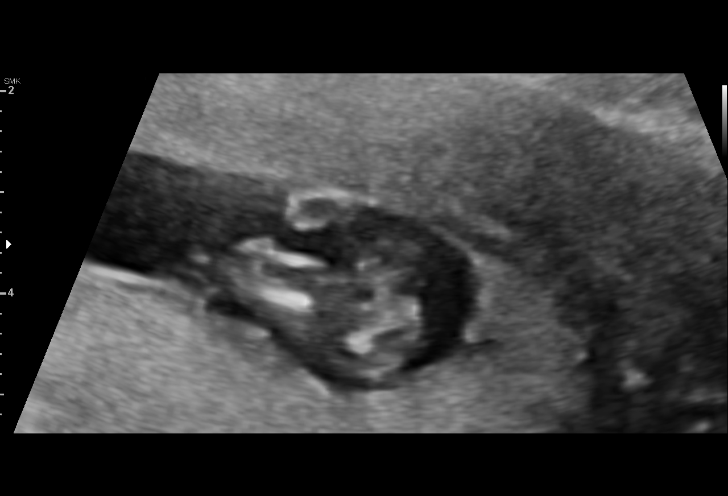

[13 of 28 positions shown; findings below may reference images not displayed]

1  US MFM OB COMP LESS THAN              76801.4     GVANCAJISHKARIANI ROYSON
    14 WEEKS

Indications

 12 weeks gestation of pregnancy
 Obesity complicating pregnancy, first
 trimester
 Hypertension - Chronic/Pre-existing
 Advanced maternal age multigravida 35+,
 first trimester
Fetal Evaluation

 Num Of Fetuses:         1
 Fetal Heart Rate(bpm):  153
 Cardiac Activity:       Observed
 Presentation:           Cephalic
 Placenta:               Posterior

                             Largest Pocket(cm)

Gestational Age

 LMP:           12w 5d        Date:  07/14/20                 EDD:   04/20/21
 Clinical EDD:  12w 5d                                        EDD:   04/20/21
 Best:          12w 5d     Det. By:  LMP  (07/14/20)          EDD:   04/20/21
1st Trimester Genetic Sonogram Screening

 CRL:            63.5  mm    G. Age:   12w 4d                 EDD:   04/21/21
Cervix Uterus Adnexa
 Cervix
 Length:            3.6  cm.

 Right Ovary
 Size(cm)     3.29   x   2.63   x  2.45      Vol(ml):
 Within normal limits.

 Left Ovary
 Not visualized. No adnexal mass visualized.
Comments

 MFM Consultation ( Please see [REDACTED] for details)

 Date of Service: 10/11/20
 Reason for Request: Maternal chronic hypertension
 Requesting provider: Masiziani Gcilishe, CNM

 Ms. Laster is a 34 yo G8P4 who is here at 12 w 5 d with an
 EDD of 04/20/21 dated by LMP consistent with a 7 weeks
 exam.

 She is seen regarding chronic hypertension at the request of
 Ms. Fredric,

 Ms. Laster is doing well today without complaints. She has
 no s/sx of miscarriage.

 Her pregnancy is complicated by the following:

 1) Chronic hypertension on medication:
 Ms Laster has been diagnosed with chronic hypertension
 for several years. She is currently taking procardia 30 XL.
 She has a normal blood pressure today. She is also taking
 low dose ASA daily. Early preeclampsia labs were drawn and
 are normal including CBC, CMP and UPC. Lastly, her
 pregnancy history is not complicated by preeclampsia.

 2) Cervical incompetence:
 Ms. Laster has a history of 20 week loss in 0006. She ws
 managed with a cerclage in her subsequent pregnancies and
 went to term. She is planning for a cerclage this pregnancy
 scheduled in the coming weeks.

 3) Elevated BMI:
 Ms Lorenzo Von Matterhorn BMI is 49 she is aware of her weight gain goals.

 Imaging:
 Normal first trimester anatomy with measurements consistent
 with dates. A detailed anatomy is precluded due to early
 gestational age.

 Impression/Counseling:

 1) Chronic Hypertension
 I discussed with Ms. Laster that chronic hypertension in
 pregnancy is defined as elevated blood pressure prior to 20
 weeks gestation. We discussed the complications associated
 with chronic hypertension including fetal growth restriction,
 placental abruption, super imposed preeclampsia, preterm
 delivery and stillbirth. I reassured her that given that her risk
 are low given that she has not had preeclampsia previously,
 however, they are above baseline. Secondly, we discussed
 continued used of daily low dose ASA for prevention of
 preeclampsia.

 Fetal surveillance should be as previously documented serial
 growth exam with detailed exam at 20 weeks. Repeat growth
 every 4-6 weeks with weekly/ 2x weekly testing beginning at
 32 weeks.

 Delivery should be considered between 37-39 weeks pending
 maternal and fetal status.

 Lastly, we discussed maintaining blood pressure below
 140/90 mmHg and increasing procardia as necessary.

 2) Prior preterm delivery
 Agree with cerclage placement in the first trimester.

 3) BMI >40
 I encouraged TWG < 11-20 lbs

 4) Advanced maternal age:
 Ms. Laster has had normal cell free DNA. We discussed
 the decreased risk for aneuploidy given her normal result.
 However, we explained that she has the option for genetic
 counesling to discuss the results and option for carrier
 screening. She opted to meet with our genetic counselor
 Drey Jim.

## 2022-10-17 IMAGING — US US MFM OB DETAIL+14 WK
1 series · 15 of 28 positions shown · non-contrast
Comparison: none

[Series 1: us mfm ob detail+14 wk · 15 of 111 slices shown]
[im 1/111]
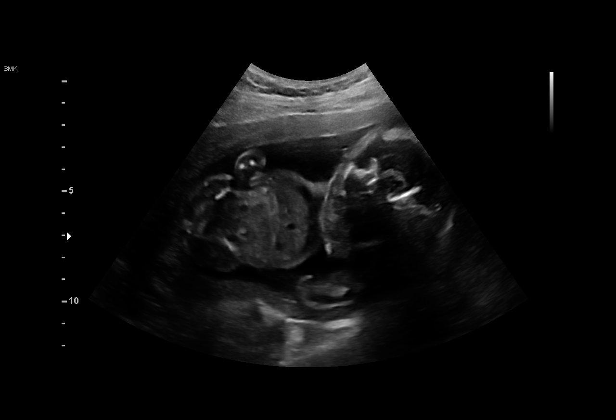
[im 9/111]
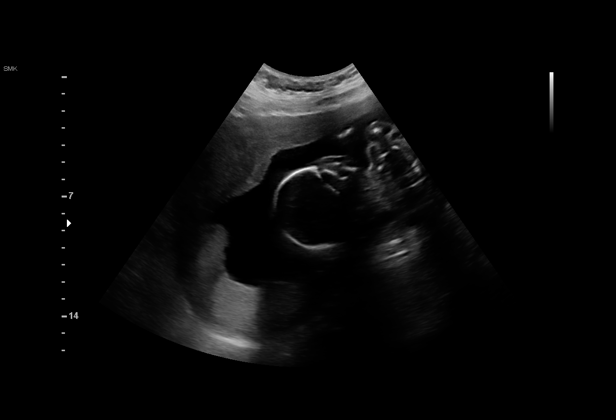
[im 17/111]
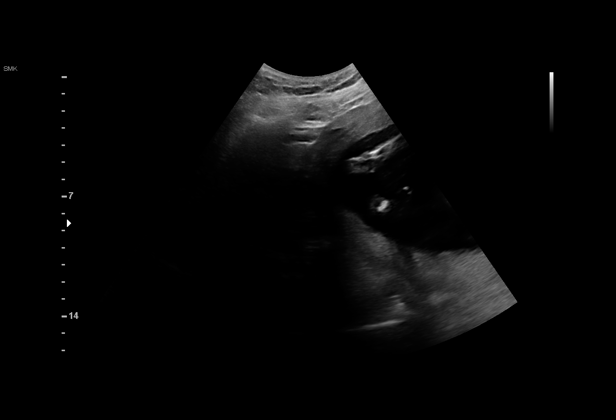
[im 25/111]
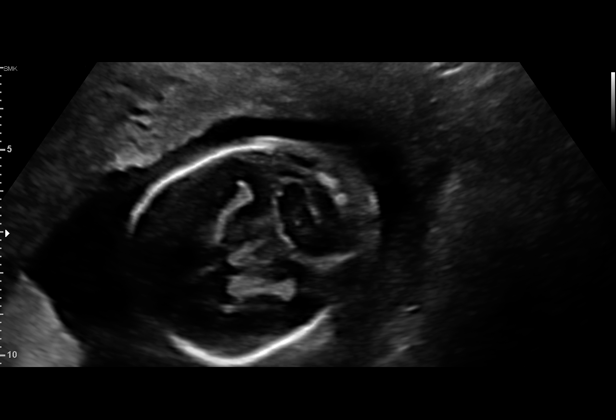
[im 33/111]
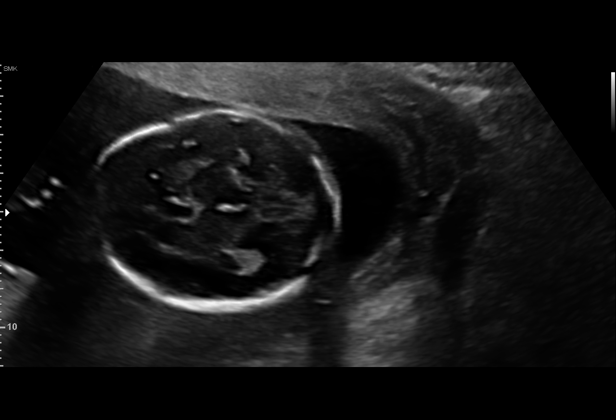
[im 41/111]
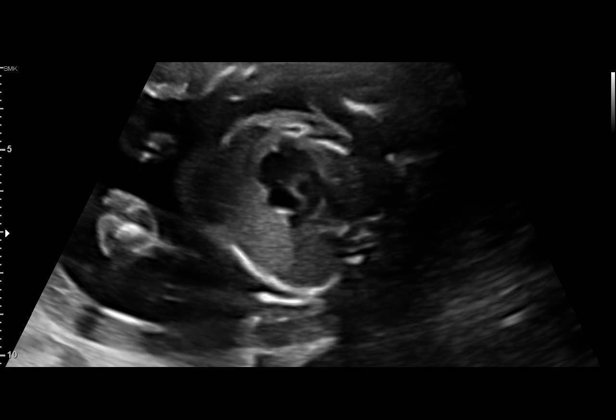
[im 49/111]
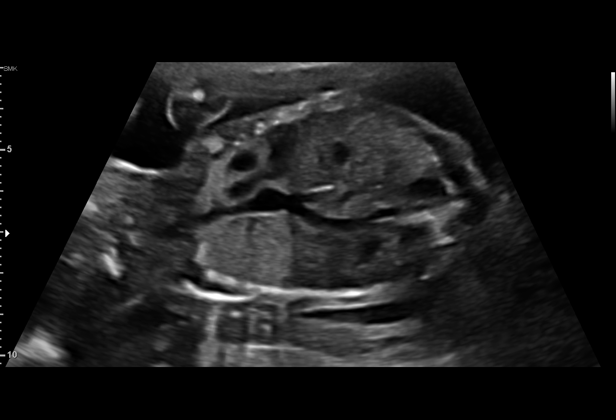
[im 58/111]
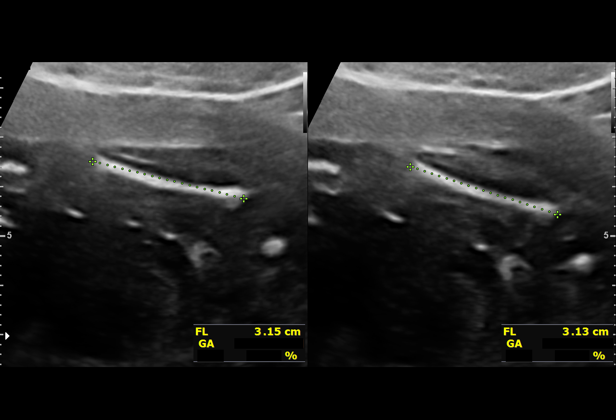
[im 62/111]
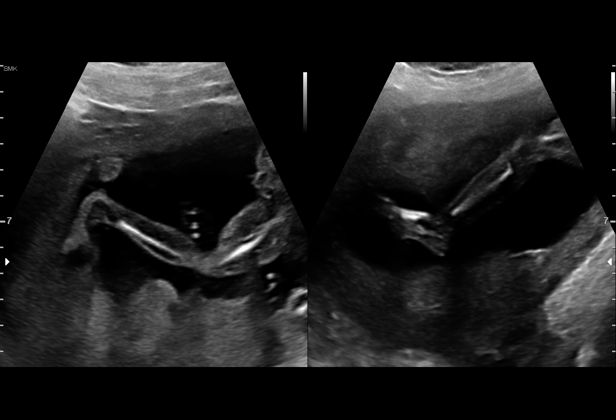
[im 70/111]
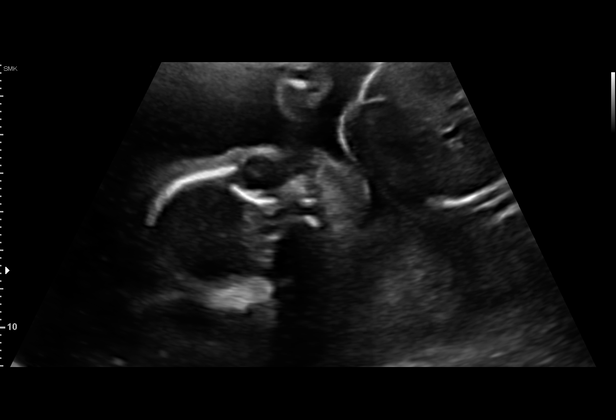
[im 78/111]
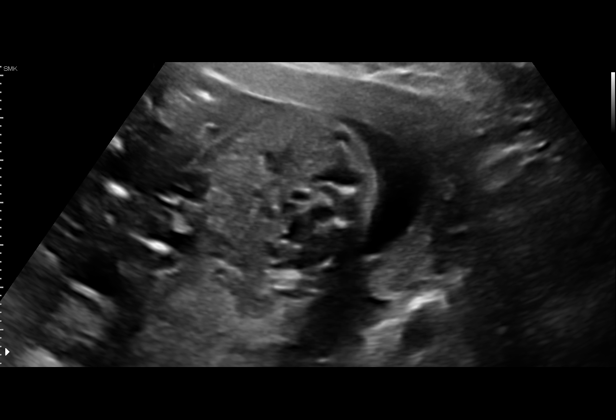
[im 86/111]
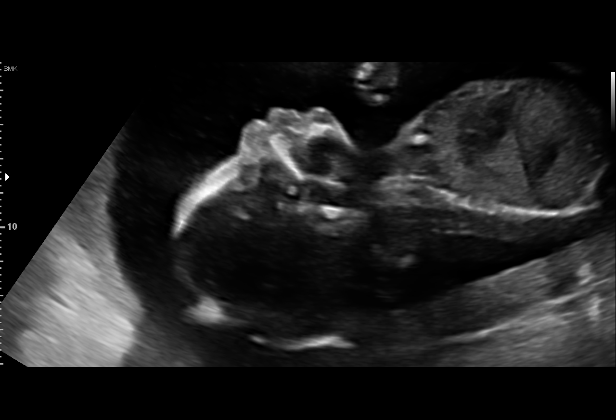
[im 94/111]
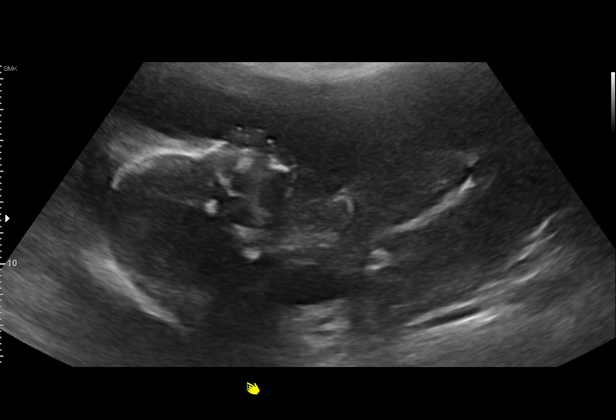
[im 102/111]
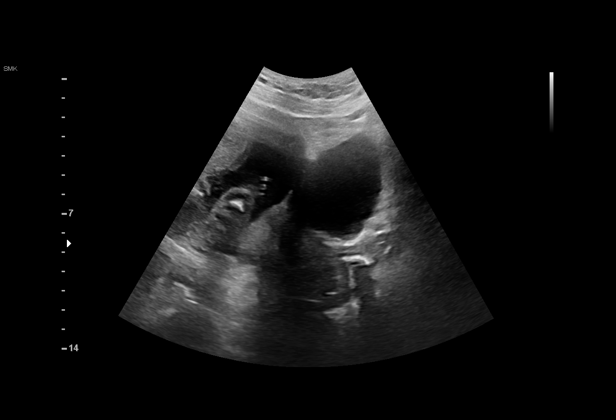
[im 111/111]
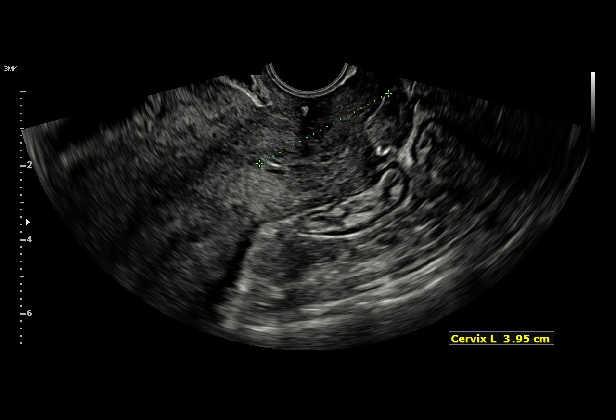

[15 of 28 positions shown; findings below may reference images not displayed]

Addendum:\.br----------------------------------------------------------------------


Indications

 20 weeks gestation of pregnancy
 Hypertension - Chronic/Pre-existing
 Advanced maternal age multigravida 35+,
 second trimester
 Obesity complicating pregnancy, second
 trimester
Fetal Evaluation

 Num Of Fetuses:         1
 Fetal Heart Rate(bpm):  147
 Cardiac Activity:       Observed
 Presentation:           Variable
 Placenta:               Fundal
 P. Cord Insertion:      Visualized, central

                             Largest Pocket(cm)

Biometry

 BPD:      49.6  mm     G. Age:  21w 0d         62  %    CI:         70.9   %    70 - 86
                                                         FL/HC:      16.7   %    15.9 -
 HC:      187.7  mm     G. Age:  21w 1d         58  %    HC/AC:      1.20        1.06 -
 AC:      156.6  mm     G. Age:  20w 6d         47  %    FL/BPD:     63.3   %
 FL:       31.4  mm     G. Age:  19w 5d         13  %    FL/AC:      20.1   %    20 - 24
 HUM:      32.8  mm     G. Age:  21w 0d         57  %
 CER:      19.7  mm     G. Age:  19w 1d         14  %
 NFT:       3.3  mm
 LV:          8  mm
 CM:        5.3  mm

 Est. FW:     353  gm    0 lb 12 oz      30  %
Gestational Age

 LMP:           20w 5d        Date:  07/14/20                 EDD:   04/20/21
 Clinical EDD:  20w 5d                                        EDD:   04/20/21
 U/S Today:     20w 5d                                        EDD:   04/20/21
 Best:          20w 5d     Det. By:  LMP  (07/14/20)          EDD:   04/20/21
Anatomy

 Cranium:               Appears normal         Aortic Arch:            Appears normal
 Cavum:                 Appears normal         Ductal Arch:            Appears normal
 Ventricles:            Appears normal         Diaphragm:              Appears normal
 Choroid Plexus:        Appears normal         Stomach:                Appears normal, left
                                                                       sided
 Cerebellum:            Appears normal         Abdomen:                Appears normal
 Posterior Fossa:       Appears normal         Abdominal Wall:         Appears nml (cord
                                                                       insert, abd wall)
 Nuchal Fold:           Appears normal         Cord Vessels:           Appears normal (3
                                                                       vessel cord)
 Face:                  Appears normal         Kidneys:                Appear normal
                        (orbits and profile)
 Lips:                  Appears normal         Bladder:                Appears normal
 Heart:                 Appears normal         Spine:                  Appears normal
                        (4CH, axis, and
                        situs)
 RVOT:                  Appears normal         Upper Extremities:      Appears normal
 LVOT:                  Appears normal         Lower Extremities:      Appears normal
Cervix Uterus Adnexa

 Cervix
 Length:            4.2  cm.

 Right Ovary
 Not visualized.

 Left Ovary
 Not visualized.
Impression

 Single intrauterine pregnancy here for a detailed anatomy
 due chronic hypertension, BMI > 50 and cervical insufficiency
 with a cerclage in place.
 Normal anatomy with measurements consistent with dates
 There is good fetal movement and amniotic fluid volume

 I reviewed today's study and recommend serial growth and
 initiation of weekly testing at 32 weeks given her diagnosis of
 chronic hypertension. We discussed the increased risk for
 placental abruption, preeclampsia, fetal growth restriction,
 hospitalization, stroke and stillbirth in uncontrolled blood
 pressure.

 Today her blood pressure is 118/64 mmHg on Procardia 30
 mg XL daily she is also takig daily low dose ASA for the
 prevention of preeclampsia.

 Lastly, Ms. Guddoo has a history indicated cerclage that is in
 place. The CL today was 4.2 cm.
 All questions answered
Recommendations

 Follow up growth is scheduled in 4-6 weeks.

*** End of Addendum ***\.br----------------------------------------------------------------------


Indications

 20 weeks gestation of pregnancy
 Hypertension - Chronic/Pre-existing
 Advanced maternal age multigravida 35+,
 second trimester
 Obesity complicating pregnancy, second
 trimester
Fetal Evaluation

 Num Of Fetuses:         1
 Fetal Heart Rate(bpm):  147
 Cardiac Activity:       Observed
 Presentation:           Variable
 Placenta:               Fundal
 P. Cord Insertion:      Visualized, central

                             Largest Pocket(cm)

Biometry

 BPD:      49.6  mm     G. Age:  21w 0d         62  %    CI:         70.9   %    70 - 86
                                                         FL/HC:      16.7   %    15.9 -
 HC:      187.7  mm     G. Age:  21w 1d         58  %    HC/AC:      1.20        1.06 -
 AC:      156.6  mm     G. Age:  20w 6d         47  %    FL/BPD:     63.3   %
 FL:       31.4  mm     G. Age:  19w 5d         13  %    FL/AC:      20.1   %    20 - 24
 HUM:      32.8  mm     G. Age:  21w 0d         57  %
 CER:      19.7  mm     G. Age:  19w 1d         14  %
 NFT:       3.3  mm

 LV:          8  mm
 CM:        5.3  mm
 Est. FW:     353  gm    0 lb 12 oz      30  %
Gestational Age

 LMP:           20w 5d        Date:  07/14/20                 EDD:   04/20/21
 Clinical EDD:  20w 5d                                        EDD:   04/20/21
 U/S Today:     20w 5d                                        EDD:   04/20/21
 Best:          20w 5d     Det. By:  LMP  (07/14/20)          EDD:   04/20/21
Anatomy

 Cranium:               Appears normal         Aortic Arch:            Appears normal
 Cavum:                 Appears normal         Ductal Arch:            Appears normal
 Ventricles:            Appears normal         Diaphragm:              Appears normal
 Choroid Plexus:        Appears normal         Stomach:                Appears normal, left
                                                                       sided
 Cerebellum:            Appears normal         Abdomen:                Appears normal
 Posterior Fossa:       Appears normal         Abdominal Wall:         Appears nml (cord
                                                                       insert, abd wall)
 Nuchal Fold:           Appears normal         Cord Vessels:           Appears normal (3
                                                                       vessel cord)
 Face:                  Appears normal         Kidneys:                Appear normal
                        (orbits and profile)
 Lips:                  Appears normal         Bladder:                Appears normal
 Heart:                 Appears normal         Spine:                  Appears normal
                        (4CH, axis, and
                        situs)
 RVOT:                  Appears normal         Upper Extremities:      Appears normal
 LVOT:                  Appears normal         Lower Extremities:      Appears normal
Cervix Uterus Adnexa

 Cervix
 Length:            4.2  cm.

 Right Ovary
 Not visualized.

 Left Ovary
 Not visualized.
Impression

 Single intrauterine pregnancy here for a detailed anatomy
 due chronic hypertension, BMI > 50 and cervical insufficiency
 with a cerclage in place.
 Normal anatomy with measurements consistent with dates
 There is good fetal movement and amniotic fluid volume

 I reviewed today's study and recommend serial growth and
 initiation of weekly testing at 32 weeks given her diagnosis of
 chronic hypertension. We discussed the increased risk for
 placental abruption, preeclampsia, fetal growth restriction,
 hospitalization, stroke and stillbirth in uncontrolled blood
 pressure.

 Today her blood pressure is 118/64 mmHg on Procardia 30
 mg XL daily she is also takig daily low dose ASA for the
 prevention of preeclampsia.

 Lastly, Ms. Guddoo has a history indicated cerclage that is in
 place. The CL today was 4.2 cm.
 All questions answered
Recommendations

 Follow up growth is scheduled in 4-6 weeks.

## 2022-12-22 ENCOUNTER — Other Ambulatory Visit: Payer: Medicaid Other

## 2022-12-23 ENCOUNTER — Other Ambulatory Visit: Payer: Medicaid Other

## 2023-05-11 ENCOUNTER — Other Ambulatory Visit: Payer: Medicaid Other

## 2023-06-09 ENCOUNTER — Encounter: Payer: Self-pay | Admitting: Neurology

## 2023-07-28 ENCOUNTER — Encounter: Payer: Self-pay | Admitting: Neurology

## 2023-07-28 ENCOUNTER — Ambulatory Visit: Payer: Medicaid Other | Admitting: Neurology

## 2023-08-20 ENCOUNTER — Ambulatory Visit: Payer: Medicaid Other | Attending: Cardiology | Admitting: Cardiology

## 2024-01-06 ENCOUNTER — Other Ambulatory Visit: Payer: Self-pay | Admitting: Physician Assistant

## 2024-01-06 ENCOUNTER — Ambulatory Visit
Admission: RE | Admit: 2024-01-06 | Discharge: 2024-01-06 | Disposition: A | Discharge status: Home / Self Care | Source: Ambulatory Visit | Attending: Physician Assistant | Admitting: Physician Assistant

## 2024-01-06 DIAGNOSIS — R2232 Localized swelling, mass and lump, left upper limb: Secondary | ICD-10-CM | POA: Insufficient documentation

## 2024-01-12 ENCOUNTER — Ambulatory Visit (INDEPENDENT_AMBULATORY_CARE_PROVIDER_SITE_OTHER): Admitting: Surgery

## 2024-01-12 ENCOUNTER — Encounter: Payer: Self-pay | Admitting: Surgery

## 2024-01-12 VITALS — BP 131/83 | HR 74 | Temp 98.6°F | Ht 67.0 in | Wt 241.0 lb

## 2024-01-12 DIAGNOSIS — Z9889 Other specified postprocedural states: Secondary | ICD-10-CM

## 2024-01-12 DIAGNOSIS — D1722 Benign lipomatous neoplasm of skin and subcutaneous tissue of left arm: Secondary | ICD-10-CM | POA: Diagnosis not present

## 2024-01-12 DIAGNOSIS — Z8719 Personal history of other diseases of the digestive system: Secondary | ICD-10-CM | POA: Diagnosis not present

## 2024-01-12 NOTE — Patient Instructions (Signed)
 Lipoma  A lipoma is a noncancerous (benign) tumor that is made up of fat cells. This is a very common type of soft-tissue growth. Lipomas are usually found under the skin (subcutaneous). They may occur in any tissue of the body that contains fat. Common areas for lipomas to appear include the back, arms, shoulders, buttocks, and thighs. Lipomas grow slowly, and they are usually painless. Most lipomas do not cause problems and do not require treatment. What are the causes? The cause of this condition is not known. What increases the risk? You are more likely to develop this condition if: You are 18-42 years old. You have a family history of lipomas. What are the signs or symptoms? A lipoma usually appears as a small, round bump under the skin. In most cases, the lump will: Feel soft or rubbery. Not cause pain or other symptoms. However, if a lipoma is located in an area where it pushes on nerves, it can become painful or cause other symptoms. How is this diagnosed? A lipoma can usually be diagnosed with a physical exam. You may also have tests to confirm the diagnosis and to rule out other conditions. Tests may include: Imaging tests, such as a CT scan or an MRI. Removal of a tissue sample to be looked at under a microscope (biopsy). How is this treated? Treatment for this condition depends on the size of the lipoma and whether it is causing any symptoms. For small lipomas that are not causing problems, no treatment is needed. If a lipoma is bigger or it causes problems, surgery may be done to remove the lipoma. Lipomas can also be removed to improve appearance. Most often, the procedure is done after applying a medicine that numbs the area (local anesthetic). Liposuction may be done to reduce the size of the lipoma before it is removed through surgery, or it may be done to remove the lipoma. Lipomas are removed with this method to limit incision size and scarring. A liposuction tube is  inserted through a small incision into the lipoma, and the contents of the lipoma are removed through the tube with suction. Follow these instructions at home: Watch your lipoma for any changes. Keep all follow-up visits. This is important. Where to find more information [employer]: orthoinfo.aaos.org Contact a health care provider if: Your lipoma becomes larger or hard. Your lipoma becomes painful, red, or increasingly swollen. These could be signs of infection or a more serious condition. Get help right away if: You develop tingling or numbness in an area near the lipoma. This could indicate that the lipoma is causing nerve damage. Summary A lipoma is a noncancerous tumor that is made up of fat cells. Most lipomas do not cause problems and do not require treatment. If a lipoma is bigger or it causes problems, surgery may be done to remove the lipoma. Contact a health care provider if your lipoma becomes larger or hard, or if it becomes painful, red, or increasingly swollen. These could be signs of infection or a more serious condition. This information is not intended to replace advice given to you by your health care provider. Make sure you discuss any questions you have with your health care provider. Document Revised: 06/28/2021 Document Reviewed: 06/28/2021 Elsevier Patient Education  2024 [employer].

## 2024-01-12 NOTE — Progress Notes (Signed)
 Patient ID: Donna Navarro, female   DOB: Sep 22, 1985, 38 y.o.   MRN: [id number]  Chief Complaint: Fatty mass left anterior shoulder  History of Present Illness Donna Navarro is a 38 y.o. female with a 3-year history of this left shoulder lipomatous lesion, she believes it is larger than it was initially.  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  She is opted not to utilize any ibuprofen  or Tylenol  for this discomfort, as it is a dull ache that is present from time to time and does not feel its worth it does not like to take medication.  She denies any history of drainage from the area.  Its never been inflamed or acutely exacerbated in any way.  She reports someone initially described her pain from bursitis, she is hoping removal of this lipoma would resolve that pain. She denies any drainage, or any other similar lesions.  Past Medical History Past Medical History:  Diagnosis Date   GERD (gastroesophageal reflux disease)    with pregnancy-no meds   Headache    migraines during pregnancy   History of hiatal hernia    large per pt   Hypertension    lost weight and pcp took pt off bp meds 7 months   Incompetent cervix    Irregular periods    Morbid obesity (HCC) 04/30/2017   Obesity affecting pregnancy in second trimester 08/09/2018   Syphilis affecting pregnancy    Ventral hernia without obstruction or gangrene 04/30/2017      Past Surgical History:  Procedure Laterality Date   CERVICAL CERCLAGE N/A 06/17/2018   Procedure: CERCLAGE CERVICAL;  Surgeon: Victor Claudell SAUNDERS, MD;  Location: [facility];  Service: Gynecology;  Laterality: N/A;x 3   CERVICAL CERCLAGE N/A 10/24/2019   Procedure: CERCLAGE CERVICAL - [facility]D - MERSILENE BAND;  Surgeon: Lovetta Debby PARAS, MD;  Location: [facility];  Service: Gynecology;  Laterality: N/A;   CERVICAL CERCLAGE N/A 11/02/2020   Procedure: CERCLAGE CERVICAL;  Surgeon:  Lovetta Debby PARAS, MD;  Location: [facility];  Service: Gynecology;  Laterality: N/A;   CHOLECYSTECTOMY      No Known Allergies  Current Outpatient Medications  Medication Sig Dispense Refill   acetaminophen  (TYLENOL ) 325 MG tablet Take 2 tablets (650 mg total) by mouth every 4 (four) hours as needed (for pain scale < 4).     ibuprofen  (ADVIL ) 600 MG tablet Take 1 tablet (600 mg total) by mouth every 6 (six) hours. 30 tablet 0   No current facility-administered medications for this visit.    Family History Family History  Problem Relation Age of Onset   Heart disease Mother        Massive Heart Attack      Social History Social History   Tobacco Use   Smoking status: Never   Smokeless tobacco: Never  Vaping Use   Vaping status: Never Used  Substance Use Topics   Alcohol use: Not Currently   Drug use: Never        Review of Systems  Constitutional: Negative.   HENT: Negative.    Eyes: Negative.   Respiratory: Negative.    Cardiovascular: Negative.   Gastrointestinal: Negative.   Genitourinary: Negative.   Musculoskeletal:  Positive for joint pain.  Skin: Negative.   Neurological: Negative.   Psychiatric/Behavioral: Negative.       Physical Exam Blood pressure 131/83, pulse 74, temperature 98.6 F (37 C), temperature source Oral, height 5'  7 (1.702 m), weight 241 lb (109.3 kg), SpO2 98%, unknown if currently breastfeeding. Last Weight  Most recent update: 01/12/2024  1:29 PM    Weight  109.3 kg (241 lb)             CONSTITUTIONAL: Well developed, and nourished, appropriately responsive and aware without distress.   EYES: Sclera non-icteric.   EARS, NOSE, MOUTH AND THROAT:  The oropharynx is clear. Oral mucosa is pink and moist.    Hearing is intact to voice.  NECK: Trachea is midline, and there is no jugular venous distension.  LYMPH NODES:  Lymph nodes in the neck are not appreciated. RESPIRATORY:   Normal respiratory effort without pathologic  use of accessory muscles. CARDIOVASCULAR:  Well perfused.  GI: The abdomen is  soft, nontender, and nondistended. MUSCULOSKELETAL:  Symmetrical muscle tone appreciated in all four extremities.   Spherical dome-shaped 4 cm diameter lipomatous tissue mass anterior left shoulder, soft to palpation, without discernible capsule, nontender. SKIN: Skin turgor is normal. No pathologic skin lesions appreciated.  Multiple tattoos present. NEUROLOGIC:  Motor and sensation appear grossly normal.  Cranial nerves are grossly without defect. PSYCH:  Alert and oriented to person, place and time. Affect is appropriate for situation.  Data Reviewed I have personally reviewed what is currently available of the patient's imaging, recent labs and medical records.   Labs:     Latest Ref Rng & Units 06/19/2022   10:14 AM 01/18/2022    8:18 AM 04/04/2021    6:04 AM  CBC  WBC 4.0 - 10.5 K/uL 20.1  12.6  18.2   Hemoglobin 12.0 - 15.0 g/dL 86.1  87.9  89.3   Hematocrit 36.0 - 46.0 % 42.6  37.8  32.2   Platelets 150 - 400 K/uL 328  289  261       Latest Ref Rng & Units 06/19/2022   10:14 AM 01/18/2022    8:18 AM 04/03/2021   12:36 AM  CMP  Glucose 70 - 99 mg/dL 878  97  827   BUN 6 - 20 mg/dL 15  11  8    Creatinine 0.44 - 1.00 mg/dL 9.26  9.20  9.32   Sodium 135 - 145 mmol/L 141  143  137   Potassium 3.5 - 5.1 mmol/L 3.1  3.5  3.7   Chloride 98 - 111 mmol/L 108  108  108   CO2 22 - 32 mmol/L 23  24  21    Calcium  8.9 - 10.3 mg/dL 9.5  9.2  9.2   Total Protein 6.5 - 8.1 g/dL 8.5  7.6  6.9   Total Bilirubin 0.3 - 1.2 mg/dL 1.3  1.1  0.3   Alkaline Phos 38 - 126 U/L 83  77  137   AST 15 - 41 U/L 14  18  11    ALT 0 - 44 U/L 12  25  9        Imaging: Radiological images personally reviewed:   Within last 24 hrs: No results found.  Assessment    Lipomatous lesion left anterior shoulder, subcutaneous location, unlikely to be the cause of the underlying musculoskeletal aches of this same  shoulder. Patient Active Problem List   Diagnosis Date Noted   Obesity affecting pregnancy in third trimester 04/03/2021   NST (non-stress test) reactive 03/21/2021   Abdominal pain during pregnancy, third trimester 01/27/2021   Cramping affecting pregnancy, antepartum 12/14/2020   Chronic hypertension during pregnancy, antepartum 03/15/2020   Preterm uterine contractions in second  trimester, antepartum 01/05/2020   High-risk pregnancy 09/30/2019   Supervision of pregnancy with other poor reproductive or obstetric history, first trimester 09/07/2019   Normal vaginal delivery 12/15/2018   Postpartum care following vaginal delivery 12/15/2018   Incompetent cervix    Pregnancy 12/12/2018   Indication for care in labor and delivery, antepartum 12/12/2018   Decreased fetal movement 12/12/2018   Diarrhea 12/04/2018   Pregnancy with abdominal cramping of lower quadrant, antepartum 12/03/2018   Sciatica 11/02/2018   Syphilis affecting pregnancy in first trimester 08/23/2018   Obesity affecting pregnancy, antepartum, third trimester 08/09/2018   Morbid obesity with BMI of 40.0-44.9, adult (HCC) 08/09/2018   Cervical cerclage suture present, antepartum 06/28/2018   High-risk pregnancy supervision, unspecified trimester 05/17/2018   Cervical incompetence 05/17/2018   Hx of cerclage, currently pregnant 05/17/2018   Oligomenorrhea 09/17/2017   Ventral hernia without obstruction or gangrene 04/30/2017   Morbid obesity (HCC) 04/30/2017    Plan    Offered excision, but I could not promise her that this would alleviate the underlying shoulder pain/discomfort she appears to be having.  Could potentially make it worse.  We discussed a trial of ibuprofen  to help her with the discomfort, but she certainly does not want to trial, apparently as this left shoulder pain does not bother her that much.  Will be glad to see her back again should she have a change of heart, desire to pursue excision or has  lost peace about its benignity.  I personally spent a total of 30 minutes in the care of the patient today including getting/reviewing separately obtained history, performing a medically appropriate exam/evaluation, counseling and educating, and documenting clinical information in the EHR.   These notes generated with voice recognition software. I apologize for typographical errors.  Honor Leghorn M.D., FACS 01/12/2024, 1:53 PM

## 2024-02-14 ENCOUNTER — Other Ambulatory Visit: Payer: Self-pay

## 2024-02-14 ENCOUNTER — Encounter: Payer: Self-pay | Admitting: Emergency Medicine

## 2024-02-14 ENCOUNTER — Emergency Department
Admission: EM | Admit: 2024-02-14 | Discharge: 2024-02-14 | Disposition: A | Discharge status: Home / Self Care | Attending: Emergency Medicine | Admitting: Emergency Medicine

## 2024-02-14 DIAGNOSIS — H6122 Impacted cerumen, left ear: Secondary | ICD-10-CM | POA: Insufficient documentation

## 2024-02-14 DIAGNOSIS — U071 COVID-19: Secondary | ICD-10-CM | POA: Insufficient documentation

## 2024-02-14 DIAGNOSIS — R509 Fever, unspecified: Secondary | ICD-10-CM | POA: Diagnosis present

## 2024-02-14 LAB — RESP PANEL BY RT-PCR (RSV, FLU A&B, COVID)  RVPGX2
Influenza A by PCR: NEGATIVE
Influenza B by PCR: NEGATIVE
Resp Syncytial Virus by PCR: NEGATIVE
SARS Coronavirus 2 by RT PCR: POSITIVE — AB

## 2024-02-14 MED ORDER — GUAIFENESIN ER 600 MG PO TB12: 600.0000 mg | ORAL_TABLET | Freq: Two times a day (BID) | ORAL | 0 refills | Status: AC

## 2024-02-14 MED ORDER — CARBAMIDE PEROXIDE 6.5 % OT SOLN: 5.0000 [drp] | Freq: Two times a day (BID) | OTIC | 0 refills | Status: AC

## 2024-02-14 MED ORDER — ONDANSETRON 4 MG PO TBDP: 4.0000 mg | ORAL_TABLET | Freq: Three times a day (TID) | ORAL | 0 refills | Status: DC | PRN

## 2024-02-14 MED ORDER — ONDANSETRON 4 MG PO TBDP
4.0000 mg | ORAL_TABLET | Freq: Once | ORAL | Status: AC
  Administered 2024-02-14: 4 mg via ORAL
  Filled 2024-02-14: qty 1

## 2024-02-14 MED ORDER — VENTOLIN HFA 108 (90 BASE) MCG/ACT IN AERS: 1.0000 | INHALATION_SPRAY | Freq: Four times a day (QID) | RESPIRATORY_TRACT | 0 refills | Status: DC | PRN

## 2024-02-14 NOTE — Discharge Instructions (Addendum)
 As we discussed, although you have tested positive for COVID-19 (coronavirus), you do not need to be hospitalized at this time.  Read through all the included information including the recommendations from the CDC.  We recommend that you self-quarantine at home with your immediate family only (people with whom you have already been in contact) for about a week after your fever has gone away (without taking medication to make your temperature come down, such as Tylenol  (acetaminophen )), after your respiratory symptoms have improved.  You should have as minimal contact as possible with anyone else including close family as per the [facility] paperwork guidelines listed below. Follow-up with your doctor by phone or online as needed and return immediately to the emergency department or call 911 only if you develop new or worsening symptoms that concern you.  If you were prescribed any medications, please use them as instructed.  You can find up-to-date information about COVID-19 in [place]  by calling the [place]  Coronavirus Helpline: 303-091-2493. You may also call 2-1-1, or 7871972927, or additional resources.  You can also find information online at PureLoser.gl, or on the Center for Disease Control (CDC) website at http://bradshaw.com/.   To help with earwax removal, please use these eardrops that I have prescribed for you.  You can also mix half warm water and half hydrogen peroxide and place it in your affected ear and then have the ear drained out.

## 2024-02-14 NOTE — ED Triage Notes (Signed)
 Patient c/o generalized body aches, lack of appetite, loss of taste and smell since yesterday.

## 2024-02-14 NOTE — ED Provider Notes (Signed)
 [facility] Provider Note    Event Date/Time   First MD Initiated Contact with Patient 02/14/24 2218     (approximate)   History   Generalized Body Aches   HPI  Donna Navarro is a 38 y.o. female  with a past medical history of sciatica, morbid obesity presents to the emergency department with fever, myalgias, nausea, loss of taste and smell, lack of appetite x 2 days.  Feels nauseous every time she eats.  Patient denies sick contacts.  Patient denies vomiting, diarrhea, abdominal pain, chest pain, sore throat. Patient states she has been feeling more fatigued and SOB with walking and doing activities since her symptoms started. Patient works as a Metallurgist.     Physical Exam   Triage Vital Signs: ED Triage Vitals  Encounter Vitals Group     BP 02/14/24 2144 135/83     Girls Systolic BP Percentile --      Girls Diastolic BP Percentile --      Boys Systolic BP Percentile --      Boys Diastolic BP Percentile --      Pulse Rate 02/14/24 2143 87     Resp 02/14/24 2141 18     Temp 02/14/24 2141 98.6 F (37 C)     Temp Source 02/14/24 2141 Oral     SpO2 02/14/24 2143 100 %     Weight 02/14/24 2142 250 lb (113.4 kg)     Height --      Head Circumference --      Peak Flow --      Pain Score 02/14/24 2142 7     Pain Loc --      Pain Education --      Exclude from Growth Chart --     Most recent vital signs: Vitals:   02/14/24 2143 02/14/24 2144  BP:  135/83  Pulse: 87   Resp:    Temp:    SpO2: 100%     General: Awake, in no acute distress. Appears stated age. Ears/Nose/Throat: TMs intact b/l, left ear with excess cerumen in middle ear. Nares patent, no nasal discharge. Oropharynx moist, no erythema or exudate. Dentition intact. Neck: Supple, no lymphadenopathy, no nuchal rigidity. CV: Good peripheral perfusion. No edema.  Respiratory:Normal respiratory effort.  No respiratory distress. CTAB. GI: Soft, non-distended, non-tender.   Skin:Warm, dry, intact. No rashes, lesions, or ecchymosis. No cyanosis or pallor. Neurological: A&O.  ED Results / Procedures / Treatments   Labs (all labs ordered are listed, but only abnormal results are displayed) Labs Reviewed  RESP PANEL BY RT-PCR (RSV, FLU A&B, COVID)  [id number] - Abnormal; Notable for the following components:      Result Value   SARS Coronavirus 2 by RT PCR POSITIVE (*)    All other components within normal limits     EKG     RADIOLOGY    PROCEDURES:  Critical Care performed: No   Procedures   MEDICATIONS ORDERED IN ED: Medications  ondansetron  (ZOFRAN -ODT) disintegrating tablet 4 mg (has no administration in time range)     IMPRESSION / MDM / ASSESSMENT AND PLAN / ED COURSE  I reviewed the triage vital signs and the nursing notes.                              Differential diagnosis includes, but is not limited to, COVID-19, influenza, RSV  Patient's presentation is most consistent  with acute complicated illness / injury requiring diagnostic workup.  Patient is a 38 year old female presenting with viral URI symptoms x 2 days.  Respiratory panel ordered and shows positive for COVID-19 with some increased SOB with activities.  Patient also reported feeling like her left ear was draining.  Physical exam shows excess cerumen of the left middle ear, respiratory exam is benign.  She has all vital signs within normal range, no respiratory distress in the room or when talking to me.  Providing her with symptomatic relief using Mucinex , albuterol  inhaler to use as needed as well as Zofran  for nausea.  Also provided her with Debrox prescription and discussed how to safely remove earwax at home.  She may use Tylenol  and ibuprofen  as needed for pain or fever.  Follow-up with her primary care provider as needed.  The patient may return to the emergency department for any new, worsening, or concerning symptoms. Patient was given the opportunity to ask  questions; all questions were answered. Emergency department return precautions were discussed with the patient.  Patient is in agreement to the treatment plan.  Patient is stable for discharge.    FINAL CLINICAL IMPRESSION(S) / ED DIAGNOSES   Final diagnoses:  COVID-19  Impacted cerumen of left ear     Rx / DC Orders   ED Discharge Orders          Ordered    ondansetron  (ZOFRAN -ODT) 4 MG disintegrating tablet  Every 8 hours PRN        02/14/24 2301    guaiFENesin  (MUCINEX ) 600 MG 12 hr tablet  2 times daily        02/14/24 2301    carbamide peroxide (DEBROX) 6.5 % OTIC solution  2 times daily        02/14/24 2301    albuterol  (VENTOLIN  HFA) 108 (90 Base) MCG/ACT inhaler  Every 6 hours PRN        02/14/24 2301             Note:  This document was prepared using Dragon voice recognition software and may include unintentional dictation errors.     Sheron Salm, PA-C 02/14/24 7690    Dorothyann Drivers, MD 02/18/24 1450

## 2024-03-02 ENCOUNTER — Ambulatory Visit: Payer: Self-pay | Admitting: Surgery

## 2024-03-02 NOTE — H&P (View-Only) (Signed)
 Subjective:   CC: Mass of skin of left shoulder [R22.32]  HPI:  referred by Alm DELENA Lien, MD for evaluation of above.   History of Present Illness Donna Navarro is a 38 year old female who presents with a lipoma on her left anterior shoulder that has increased in size and is causing significant pain.  The lipoma on her left anterior shoulder has been increasing in size over the past year. 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 The pain worsens by the end of her workday.  She has used warm compresses and Tylenol  for pain management without relief. The pain is a dull ache localized to the area, with numbness and tingling extending down the arm  The lump has changed in size but not in shape or color. She has no personal or family history of cancer.    Past Medical History:  has a past medical history of Hypertension.  Past Surgical History:  has a past surgical history that includes Laparoscopic cholecystectomy; Cervical Cerclage Vaginal (06/17/2018); and cervical cerclage vaginal.  Family History: family history includes Diabetes in her maternal grandmother; High blood pressure (Hypertension) in her mother; Myocardial Infarction (Heart attack) in her father and mother; No Known Problems in her maternal grandfather, paternal grandfather, and paternal grandmother.  Social History:  reports that she has never smoked. She has never used smokeless tobacco. She reports that she does not currently use alcohol. She reports that she does not use drugs.  Current Medications: has a current medication list which includes the following prescription(s): acetaminophen , prenatal vit-iron fum-folic ac, aspirin, calcium  carbonate, ferrous sulfate , folic acid, and nifedipine , and the following Facility-Administered Medications: hydroxyprogest(pf)(preg presv), hydroxyprogest(pf)(preg presv), hydroxyprogest(pf)(preg presv), and  hydroxyprogest(pf)(preg presv).  Allergies:  No Known Allergies  ROS:  A 15 point review of systems was performed and pertinent positives and negatives noted in HPI   Objective:     BP 127/69   Pulse 65   Ht 170.2 cm (5' 7)   Wt (!) 108.9 kg (240 lb)   LMP 02/24/2024   BMI 37.59 kg/m   Constitutional :  No distress, cooperative, alert  Lymphatics/Throat:  Supple with no lymphadenopathy  Respiratory:  Clear to auscultation bilaterally  Cardiovascular:  Regular rate and rhythm  Gastrointestinal: Soft, non-tender, non-distended, no organomegaly.  Musculoskeletal: Steady gait and movement  Skin: Cool and moist, left shoulder with palpable mass anterior aspect, smooth, mobile, soft, consistent with lipoma  Psychiatric: Normal affect, non-agitated, not confused         LABS:  N/a   RADS: CLINICAL DATA:  Left anterior shoulder mass for several years   EXAM:  ULTRASOUND LEFT UPPER EXTREMITY LIMITED   TECHNIQUE:  Ultrasound examination of the upper extremity soft tissues was  performed in the area of clinical concern.   COMPARISON:  None Available.   FINDINGS:  Targeted ultrasound examination of the site of palpable abnormality  of the anterior left shoulder demonstrates a well-circumscribed,  slightly hyperechoic mass in the subcutaneous fat measuring 6.9 x  6.0 x 1.9 cm.   IMPRESSION:  Targeted ultrasound examination of the site of palpable abnormality  of the anterior left shoulder demonstrates a well-circumscribed,  slightly hyperechoic mass in the subcutaneous fat measuring 6.9 x  6.0 x 1.9 cm. This is most consistent with a lipoma, however  liposarcoma can be difficult to differentiate on the basis of  imaging. Consider surgical referral if there has  been growth or  other concerning clinical change.    Electronically Signed    By: Marolyn JONETTA Jaksch M.D.    On: 01/06/2024 16:59   Assessment:      Mass of skin of left shoulder [R22.32]  Plan:     1.  Mass of skin of left shoulder [R22.32] Discussed surgical excision.  Alternatives include continued observation.  Benefits include possible symptom relief, pathologic evaluation, improved cosmesis. Discussed the risk of surgery including recurrence, chronic pain, post-op infxn, poor cosmesis, poor/delayed wound healing, and possible re-operation to address said risks. The risks of general anesthetic, if used, includes MI, CVA, sudden death or even reaction to anesthetic medications also discussed.  Typical post-op recovery time of 3-5 days with possible activity restrictions were also discussed.  The patient verbalized understanding and all questions were answered to the patient's satisfaction.  2. Patient has elected to proceed with surgical treatment. Procedure will be scheduled. Supine position  labs/images/medications/previous chart entries reviewed personally and relevant changes/updates noted above.

## 2024-03-02 NOTE — H&P (Signed)
 Subjective:   CC: Mass of skin of left shoulder [R22.32]  HPI:  referred by Alm DELENA Lien, MD for evaluation of above.   History of Present Illness Donna Navarro is a 38 year old female who presents with a lipoma on her left anterior shoulder that has increased in size and is causing significant pain.  The lipoma on her left anterior shoulder has been increasing in size over the past year. 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 The pain worsens by the end of her workday.  She has used warm compresses and Tylenol  for pain management without relief. The pain is a dull ache localized to the area, with numbness and tingling extending down the arm  The lump has changed in size but not in shape or color. She has no personal or family history of cancer.    Past Medical History:  has a past medical history of Hypertension.  Past Surgical History:  has a past surgical history that includes Laparoscopic cholecystectomy; Cervical Cerclage Vaginal (06/17/2018); and cervical cerclage vaginal.  Family History: family history includes Diabetes in her maternal grandmother; High blood pressure (Hypertension) in her mother; Myocardial Infarction (Heart attack) in her father and mother; No Known Problems in her maternal grandfather, paternal grandfather, and paternal grandmother.  Social History:  reports that she has never smoked. She has never used smokeless tobacco. She reports that she does not currently use alcohol. She reports that she does not use drugs.  Current Medications: has a current medication list which includes the following prescription(s): acetaminophen , prenatal vit-iron fum-folic ac, aspirin, calcium  carbonate, ferrous sulfate , folic acid, and nifedipine , and the following Facility-Administered Medications: hydroxyprogest(pf)(preg presv), hydroxyprogest(pf)(preg presv), hydroxyprogest(pf)(preg presv), and  hydroxyprogest(pf)(preg presv).  Allergies:  No Known Allergies  ROS:  A 15 point review of systems was performed and pertinent positives and negatives noted in HPI   Objective:     BP 127/69   Pulse 65   Ht 170.2 cm (5' 7)   Wt (!) 108.9 kg (240 lb)   LMP 02/24/2024   BMI 37.59 kg/m   Constitutional :  No distress, cooperative, alert  Lymphatics/Throat:  Supple with no lymphadenopathy  Respiratory:  Clear to auscultation bilaterally  Cardiovascular:  Regular rate and rhythm  Gastrointestinal: Soft, non-tender, non-distended, no organomegaly.  Musculoskeletal: Steady gait and movement  Skin: Cool and moist, left shoulder with palpable mass anterior aspect, smooth, mobile, soft, consistent with lipoma  Psychiatric: Normal affect, non-agitated, not confused         LABS:  N/a   RADS: CLINICAL DATA:  Left anterior shoulder mass for several years   EXAM:  ULTRASOUND LEFT UPPER EXTREMITY LIMITED   TECHNIQUE:  Ultrasound examination of the upper extremity soft tissues was  performed in the area of clinical concern.   COMPARISON:  None Available.   FINDINGS:  Targeted ultrasound examination of the site of palpable abnormality  of the anterior left shoulder demonstrates a well-circumscribed,  slightly hyperechoic mass in the subcutaneous fat measuring 6.9 x  6.0 x 1.9 cm.   IMPRESSION:  Targeted ultrasound examination of the site of palpable abnormality  of the anterior left shoulder demonstrates a well-circumscribed,  slightly hyperechoic mass in the subcutaneous fat measuring 6.9 x  6.0 x 1.9 cm. This is most consistent with a lipoma, however  liposarcoma can be difficult to differentiate on the basis of  imaging. Consider surgical referral if there has  been growth or  other concerning clinical change.    Electronically Signed    By: Marolyn JONETTA Jaksch M.D.    On: 01/06/2024 16:59   Assessment:      Mass of skin of left shoulder [R22.32]  Plan:     1.  Mass of skin of left shoulder [R22.32] Discussed surgical excision.  Alternatives include continued observation.  Benefits include possible symptom relief, pathologic evaluation, improved cosmesis. Discussed the risk of surgery including recurrence, chronic pain, post-op infxn, poor cosmesis, poor/delayed wound healing, and possible re-operation to address said risks. The risks of general anesthetic, if used, includes MI, CVA, sudden death or even reaction to anesthetic medications also discussed.  Typical post-op recovery time of 3-5 days with possible activity restrictions were also discussed.  The patient verbalized understanding and all questions were answered to the patient's satisfaction.  2. Patient has elected to proceed with surgical treatment. Procedure will be scheduled. Supine position  labs/images/medications/previous chart entries reviewed personally and relevant changes/updates noted above.

## 2024-03-25 ENCOUNTER — Encounter
Admission: RE | Admit: 2024-03-25 | Discharge: 2024-03-25 | Disposition: A | Discharge status: Home / Self Care | Source: Ambulatory Visit | Attending: Surgery | Admitting: Surgery

## 2024-03-25 ENCOUNTER — Other Ambulatory Visit: Payer: Self-pay

## 2024-03-25 VITALS — Ht 67.0 in | Wt 245.0 lb

## 2024-03-25 DIAGNOSIS — Z01812 Encounter for preprocedural laboratory examination: Secondary | ICD-10-CM

## 2024-03-25 NOTE — Patient Instructions (Addendum)
 Your procedure is scheduled on:  FRIDAY OCTOBER 10  Report to the Registration Desk on the 1st floor of the [facility] To find out your arrival time, please call [phone] between 1PM - 3PM on:  THURSDAY OCTOBER 9  If your arrival time is 6:00 am, do not arrive before that time as the [facility] entrance doors do not open until 6:00 am.  REMEMBER: Instructions that are not followed completely may result in serious medical risk, up to and including death; or upon the discretion of your surgeon and anesthesiologist your surgery may need to be rescheduled.  Do not eat food after midnight the night before surgery.  No gum chewing or hard candies.  You may however, drink CLEAR liquids up to 2 hours before you are scheduled to arrive for your surgery. Do not drink anything within 2 hours of your scheduled arrival time.  Clear liquids include: - water  - apple juice without pulp - gatorade (not RED colors) - black coffee or tea (Do NOT add milk or creamers to the coffee or tea) Do NOT drink anything that is not on this list.   One week prior to surgery: STARTING FRIDAY OCTOBER 3 Stop Anti-inflammatories (NSAIDS) such as Advil , Aleve , Ibuprofen , Motrin , Naproxen , Naprosyn  and Aspirin based products such as Excedrin, Goody's Powder, BC Powder. Stop ANY OVER THE COUNTER supplements until after surgery.  You may however, continue to take Tylenol  if needed for pain up until the day of surgery.  Continue taking all of your other prescription medications up until the day of surgery.  ON THE DAY OF SURGERY DO NOT TAKE ANY MEDICATIONS   No Alcohol for 24 hours before or after surgery.  Do not use any recreational drugs for at least a week (preferably 2 weeks) before your surgery.  Please be advised that the combination of cocaine and anesthesia may have negative outcomes, up to and including death. If you test positive for cocaine, your surgery will be cancelled.  On the morning of  surgery brush your teeth with toothpaste and water, you may rinse your mouth with mouthwash if you wish. Do not swallow any toothpaste or mouthwash.  Use CHG Soap as directed on instruction sheet.  Do not wear jewelry, make-up, hairpins, clips or nail polish.  For welded (permanent) jewelry: bracelets, anklets, waist bands, etc.  Please have this removed prior to surgery.  If it is not removed, there is a chance that hospital personnel will need to cut it off on the day of surgery.  Do not wear lotions, powders, or perfumes.   Do not shave body hair from the neck down 48 hours before surgery.  Do not bring valuables to the hospital. [facility] is not responsible for any missing/lost belongings or valuables.   Notify your doctor if there is any change in your medical condition (cold, fever, infection).  Wear comfortable clothing (specific to your surgery type) to the hospital.  After surgery, you can help prevent lung complications by doing breathing exercises.  Take deep breaths and cough every 1-2 hours.   If you are being discharged the day of surgery, you will not be allowed to drive home. You will need a responsible individual to drive you home and stay with you for 24 hours after surgery.   If you are taking public transportation, you will need to have a responsible individual with you.  Please call the Pre-admissions Testing Dept. at [phone] if you have any questions about  these instructions.  Surgery Visitation Policy:  Patients having surgery or a procedure may have two visitors.  Children under the age of 55 must have an adult with them who is not the patient.  Merchandiser, retail to address health-related social needs:  [email or web address]                                                                                                             Preparing for Surgery with CHLORHEXIDINE  GLUCONATE (CHG) Soap  Chlorhexidine  Gluconate (CHG)  Soap  o An antiseptic cleaner that kills germs and bonds with the skin to continue killing germs even after washing  o Used for showering the night before surgery and morning of surgery  Before surgery, you can play an important role by reducing the number of germs on your skin.  CHG (Chlorhexidine  gluconate) soap is an antiseptic cleanser which kills germs and bonds with the skin to continue killing germs even after washing.  Please do not use if you have an allergy to CHG or antibacterial soaps. If your skin becomes reddened/irritated stop using the CHG.  1. Shower the NIGHT BEFORE SURGERY with CHG soap.  2. If you choose to wash your hair, wash your hair first as usual with your normal shampoo.  3. After shampooing, rinse your hair and body thoroughly to remove the shampoo.  4. Use CHG as you would any other liquid soap. You can apply CHG directly to the skin and wash gently with a clean washcloth.  5. Apply the CHG soap to your body only from the neck down. Do not use on open wounds or open sores. Avoid contact with your eyes, ears, mouth, and genitals (private parts). Wash face and genitals (private parts) with your normal soap.  6. Wash thoroughly, paying special attention to the area where your surgery will be performed.  7. Thoroughly rinse your body with warm water.  8. Do not shower/wash with your normal soap after using and rinsing off the CHG soap.  9. Do not use lotions, oils, etc., after showering with CHG.  10. Pat yourself dry with a clean towel.  11. Wear clean pajamas to bed the night before surgery.  12. Place clean sheets on your bed the night of your shower and do not sleep with pets.  13. Do not apply any deodorants/lotions/powders.  14. Please wear clean clothes to the hospital.  15. Remember to brush your teeth with your regular toothpaste.

## 2024-04-01 ENCOUNTER — Ambulatory Visit
Admission: RE | Admit: 2024-04-01 | Discharge: 2024-04-01 | Disposition: A | Discharge status: Home / Self Care | Attending: Surgery | Admitting: Surgery

## 2024-04-01 ENCOUNTER — Encounter
Admission: RE | Disposition: A | Discharge status: Home / Self Care | Payer: Self-pay | Source: Home / Self Care | Attending: Surgery

## 2024-04-01 ENCOUNTER — Other Ambulatory Visit: Payer: Self-pay

## 2024-04-01 ENCOUNTER — Ambulatory Visit: Admitting: Anesthesiology

## 2024-04-01 ENCOUNTER — Encounter: Payer: Self-pay | Admitting: Surgery

## 2024-04-01 DIAGNOSIS — K219 Gastro-esophageal reflux disease without esophagitis: Secondary | ICD-10-CM | POA: Insufficient documentation

## 2024-04-01 DIAGNOSIS — R2232 Localized swelling, mass and lump, left upper limb: Secondary | ICD-10-CM | POA: Diagnosis present

## 2024-04-01 DIAGNOSIS — Z01812 Encounter for preprocedural laboratory examination: Secondary | ICD-10-CM

## 2024-04-01 DIAGNOSIS — E669 Obesity, unspecified: Secondary | ICD-10-CM | POA: Diagnosis not present

## 2024-04-01 DIAGNOSIS — D1722 Benign lipomatous neoplasm of skin and subcutaneous tissue of left arm: Secondary | ICD-10-CM | POA: Insufficient documentation

## 2024-04-01 DIAGNOSIS — I10 Essential (primary) hypertension: Secondary | ICD-10-CM | POA: Insufficient documentation

## 2024-04-01 DIAGNOSIS — Z9884 Bariatric surgery status: Secondary | ICD-10-CM | POA: Diagnosis not present

## 2024-04-01 HISTORY — PX: EXCISION, MASS, UPPER EXTREMITY: SHX7567

## 2024-04-01 LAB — POCT PREGNANCY, URINE: Preg Test, Ur: NEGATIVE

## 2024-04-01 SURGERY — EXCISION, MASS, UPPER EXTREMITY
Anesthesia: General | Site: Shoulder | Laterality: Left

## 2024-04-01 MED ORDER — IBUPROFEN 800 MG PO TABS
800.0000 mg | ORAL_TABLET | Freq: Three times a day (TID) | ORAL | 0 refills | Status: AC | PRN
  Filled 2024-04-01: qty 30, 10d supply, fill #0

## 2024-04-01 MED ORDER — GLYCOPYRROLATE 0.2 MG/ML IJ SOLN
INTRAMUSCULAR | Status: DC | PRN
Start: 2024-04-01 — End: 2024-04-01
  Administered 2024-04-01: .2 mg via INTRAVENOUS

## 2024-04-01 MED ORDER — CHLORHEXIDINE GLUCONATE 0.12 % MT SOLN
OROMUCOSAL | Status: AC
  Filled 2024-04-01: qty 15

## 2024-04-01 MED ORDER — DEXAMETHASONE SOD PHOSPHATE PF 10 MG/ML IJ SOLN
INTRAMUSCULAR | Status: DC | PRN
Start: 2024-04-01 — End: 2024-04-01
  Administered 2024-04-01: 10 mg via INTRAVENOUS

## 2024-04-01 MED ORDER — ACETAMINOPHEN 10 MG/ML IV SOLN
INTRAVENOUS | Status: DC | PRN
Start: 2024-04-01 — End: 2024-04-01
  Administered 2024-04-01: 1000 mg via INTRAVENOUS

## 2024-04-01 MED ORDER — FENTANYL CITRATE (PF) 100 MCG/2ML IJ SOLN
INTRAMUSCULAR | Status: DC | PRN
  Administered 2024-04-01: 50 ug via INTRAVENOUS
  Administered 2024-04-01 (×2): 25 ug via INTRAVENOUS

## 2024-04-01 MED ORDER — BUPIVACAINE-EPINEPHRINE (PF) 0.5% -1:200000 IJ SOLN
INTRAMUSCULAR | Status: AC
  Filled 2024-04-01: qty 30

## 2024-04-01 MED ORDER — FENTANYL CITRATE (PF) 100 MCG/2ML IJ SOLN
INTRAMUSCULAR | Status: AC
  Filled 2024-04-01: qty 2

## 2024-04-01 MED ORDER — CEFAZOLIN SODIUM-DEXTROSE 2-4 GM/100ML-% IV SOLN
2.0000 g | INTRAVENOUS | Status: AC
  Administered 2024-04-01: 2 g via INTRAVENOUS

## 2024-04-01 MED ORDER — MIDAZOLAM HCL 2 MG/2ML IJ SOLN
INTRAMUSCULAR | Status: AC
  Filled 2024-04-01: qty 2

## 2024-04-01 MED ORDER — CHLORHEXIDINE GLUCONATE CLOTH 2 % EX PADS
6.0000 | MEDICATED_PAD | Freq: Once | CUTANEOUS | Status: AC
Start: 2024-04-01 — End: 2024-04-01
  Administered 2024-04-01: 6 via TOPICAL

## 2024-04-01 MED ORDER — TRAMADOL HCL 50 MG PO TABS
50.0000 mg | ORAL_TABLET | Freq: Three times a day (TID) | ORAL | 0 refills | Status: AC | PRN
  Filled 2024-04-01: qty 6, 2d supply, fill #0

## 2024-04-01 MED ORDER — ONDANSETRON HCL 4 MG/2ML IJ SOLN: 4.0000 mg | Freq: Once | INTRAMUSCULAR | Status: DC | PRN

## 2024-04-01 MED ORDER — EPHEDRINE SULFATE (PRESSORS) 50 MG/ML IJ SOLN
INTRAMUSCULAR | Status: DC | PRN
  Administered 2024-04-01: 5 mg via INTRAVENOUS

## 2024-04-01 MED ORDER — LACTATED RINGERS IV SOLN: INTRAVENOUS | Status: DC

## 2024-04-01 MED ORDER — ACETAMINOPHEN 10 MG/ML IV SOLN: 1000.0000 mg | Freq: Once | INTRAVENOUS | Status: DC | PRN

## 2024-04-01 MED ORDER — ONDANSETRON HCL 4 MG/2ML IJ SOLN
INTRAMUSCULAR | Status: DC | PRN
  Administered 2024-04-01: 4 mg via INTRAVENOUS

## 2024-04-01 MED ORDER — ORAL CARE MOUTH RINSE: 15.0000 mL | Freq: Once | OROMUCOSAL | Status: AC

## 2024-04-01 MED ORDER — 0.9 % SODIUM CHLORIDE (POUR BTL) OPTIME
TOPICAL | Status: DC | PRN
  Administered 2024-04-01: 500 mL

## 2024-04-01 MED ORDER — BUPIVACAINE-EPINEPHRINE (PF) 0.5% -1:200000 IJ SOLN
INTRAMUSCULAR | Status: DC | PRN
  Administered 2024-04-01: 14 mL via PERINEURAL

## 2024-04-01 MED ORDER — OXYCODONE HCL 5 MG/5ML PO SOLN: 5.0000 mg | Freq: Once | ORAL | Status: DC | PRN

## 2024-04-01 MED ORDER — FENTANYL CITRATE (PF) 100 MCG/2ML IJ SOLN: 25.0000 ug | INTRAMUSCULAR | Status: DC | PRN

## 2024-04-01 MED ORDER — LIDOCAINE HCL (CARDIAC) PF 100 MG/5ML IV SOSY
PREFILLED_SYRINGE | INTRAVENOUS | Status: DC | PRN
  Administered 2024-04-01: 100 mg via INTRAVENOUS

## 2024-04-01 MED ORDER — DOCUSATE SODIUM 100 MG PO CAPS
100.0000 mg | ORAL_CAPSULE | Freq: Two times a day (BID) | ORAL | 0 refills | Status: AC | PRN
  Filled 2024-04-01: qty 20, 10d supply, fill #0

## 2024-04-01 MED ORDER — CEFAZOLIN SODIUM-DEXTROSE 2-4 GM/100ML-% IV SOLN
INTRAVENOUS | Status: AC
  Filled 2024-04-01: qty 100

## 2024-04-01 MED ORDER — PROPOFOL 500 MG/50ML IV EMUL
INTRAVENOUS | Status: DC | PRN
  Administered 2024-04-01: 125 ug/kg/min via INTRAVENOUS
  Administered 2024-04-01: 150 mg via INTRAVENOUS

## 2024-04-01 MED ORDER — MIDAZOLAM HCL 2 MG/2ML IJ SOLN
INTRAMUSCULAR | Status: DC | PRN
  Administered 2024-04-01: 2 mg via INTRAVENOUS

## 2024-04-01 MED ORDER — CHLORHEXIDINE GLUCONATE 0.12 % MT SOLN
15.0000 mL | Freq: Once | OROMUCOSAL | Status: AC
  Administered 2024-04-01: 15 mL via OROMUCOSAL

## 2024-04-01 MED ORDER — OXYCODONE HCL 5 MG PO TABS: 5.0000 mg | ORAL_TABLET | Freq: Once | ORAL | Status: DC | PRN

## 2024-04-01 SURGICAL SUPPLY — 25 items
BLADE SURG 15 STRL LF DISP TIS (BLADE) ×1 IMPLANT
DERMABOND ADVANCED .7 DNX12 (GAUZE/BANDAGES/DRESSINGS) ×1 IMPLANT
DRAPE LAPAROTOMY 100X77 ABD (DRAPES) ×1 IMPLANT
DRAPE SHEET LG 3/4 BI-LAMINATE (DRAPES) ×1 IMPLANT
ELECTRODE REM PT RTRN 9FT ADLT (ELECTROSURGICAL) ×1 IMPLANT
GAUZE 4X4 16PLY ~~LOC~~+RFID DBL (SPONGE) IMPLANT
GLOVE BIOGEL PI IND STRL 7.0 (GLOVE) ×1 IMPLANT
GLOVE SURG SYN 6.5 PF PI (GLOVE) ×3 IMPLANT
GOWN STRL REUS W/ TWL LRG LVL3 (GOWN DISPOSABLE) ×3 IMPLANT
KIT TURNOVER KIT A (KITS) ×1 IMPLANT
LABEL OR SOLS (LABEL) ×1 IMPLANT
MANIFOLD NEPTUNE II (INSTRUMENTS) ×1 IMPLANT
NDL HYPO 22X1.5 SAFETY MO (MISCELLANEOUS) ×1 IMPLANT
NEEDLE HYPO 22X1.5 SAFETY MO (MISCELLANEOUS) ×1 IMPLANT
PACK BASIN MINOR ARMC (MISCELLANEOUS) ×1 IMPLANT
SOLN 0.9% NACL 1000 ML (IV SOLUTION) ×1 IMPLANT
SOLN 0.9% NACL POUR BTL 1000ML (IV SOLUTION) ×1 IMPLANT
SUT SILK 2 0 SH (SUTURE) IMPLANT
SUT VIC AB 3-0 SH 27X BRD (SUTURE) ×1 IMPLANT
SUTURE EHLN 3-0 FS-10 30 BLK (SUTURE) IMPLANT
SUTURE MNCRL 4-0 27XMF (SUTURE) ×1 IMPLANT
SYR 20ML LL LF (SYRINGE) ×1 IMPLANT
TOWEL OR 17X26 4PK STRL BLUE (TOWEL DISPOSABLE) IMPLANT
TRAP FLUID SMOKE EVACUATOR (MISCELLANEOUS) ×1 IMPLANT
WATER STERILE IRR 500ML POUR (IV SOLUTION) ×1 IMPLANT

## 2024-04-01 NOTE — Discharge Instructions (Addendum)
 Removal, Care After This sheet gives you information about how to care for yourself after your procedure. Your health care provider may also give you more specific instructions. If you have problems or questions, contact your health care provider. What can I expect after the procedure? After the procedure, it is common to have: Soreness. Bruising. Itching. Follow these instructions at home: site care Follow instructions from your health care provider about how to take care of your site. Make sure you: Wash your hands with soap and water before and after you change your bandage (dressing). If soap and water are not available, use hand sanitizer. Leave stitches (sutures), skin glue, or adhesive strips in place. These skin closures may need to stay in place for 2 weeks or longer. If adhesive strip edges start to loosen and curl up, you may trim the loose edges. Do not remove adhesive strips completely unless your health care provider tells you to do that. If the area bleeds or bruises, apply gentle pressure for 10 minutes. OK TO SHOWER IN 24HRS  Check your site every day for signs of infection. Check for: Redness, swelling, or pain. Fluid or blood. Warmth. Pus or a bad smell.  General instructions Rest and then return to your normal activities as told by your health care provider.  tylenol and advil as needed for discomfort.  Please alternate between the two every four hours as needed for pain.    Use narcotics, if prescribed, only when tylenol and motrin is not enough to control pain.  325-650mg  every 8hrs to max of 3000mg /24hrs (including the 325mg  in every norco dose) for the tylenol.    Advil up to 800mg  per dose every 8hrs as needed for pain.   Keep all follow-up visits as told by your health care provider. This is important. Contact a health care provider if: You have redness, swelling, or pain around your site. You have fluid or blood coming from your site. Your site feels warm to  the touch. You have pus or a bad smell coming from your site. You have a fever. Your sutures, skin glue, or adhesive strips loosen or come off sooner than expected. Get help right away if: You have bleeding that does not stop with pressure or a dressing. Summary After the procedure, it is common to have some soreness, bruising, and itching at the site. Follow instructions from your health care provider about how to take care of your site. Check your site every day for signs of infection. Contact a health care provider if you have redness, swelling, or pain around your site, or your site feels warm to the touch. Keep all follow-up visits as told by your health care provider. This is important. This information is not intended to replace advice given to you by your health care provider. Make sure you discuss any questions you have with your health care provider. Document Released: 07/06/2015 Document Revised: 12/07/2017 Document Reviewed: 12/07/2017 Elsevier Interactive Patient Education  [employer].

## 2024-04-01 NOTE — Anesthesia Postprocedure Evaluation (Signed)
 Anesthesia Post Note  Patient: KAYLEI FRINK  Procedure(s) Performed: EXCISION, MASS, UPPER EXTREMITY (Left: Shoulder)  Patient location during evaluation: PACU Anesthesia Type: General Level of consciousness: awake and alert, oriented and patient cooperative Pain management: pain level controlled Vital Signs Assessment: post-procedure vital signs reviewed and stable Respiratory status: spontaneous breathing, nonlabored ventilation and respiratory function stable Cardiovascular status: blood pressure returned to baseline and stable Postop Assessment: adequate PO intake Anesthetic complications: no   There were no known notable events for this encounter.   Last Vitals:  Vitals:   04/01/24 1045 04/01/24 1100  BP: (!) 152/89 (!) 173/90  Pulse: (!) 54 60  Resp: 20 19  Temp:  (!) 36.2 C  SpO2: 100% 100%    Last Pain:  Vitals:   04/01/24 1100  PainSc: 0-No pain                 Alfonso Ruths

## 2024-04-01 NOTE — Interval H&P Note (Signed)
 No change. OK to proceed.

## 2024-04-01 NOTE — Transfer of Care (Signed)
 Immediate Anesthesia Transfer of Care Note  Patient: Donna Navarro  Procedure(s) Performed: EXCISION, MASS, UPPER EXTREMITY (Left: Shoulder)  Patient Location: PACU  Anesthesia Type:General  Level of Consciousness: awake, alert , and oriented  Airway & Oxygen Therapy: Patient Spontanous Breathing  Post-op Assessment: Report given to RN and Post -op Vital signs reviewed and stable  Post vital signs: stable  Last Vitals:  Vitals Value Taken Time  BP 147/98 04/01/24 10:22  Temp 36.1 C 04/01/24 10:22  Pulse 78 04/01/24 10:24  Resp 22 04/01/24 10:24  SpO2 100 % 04/01/24 10:24  Vitals shown include unfiled device data.  Last Pain:  Vitals:   04/01/24 0757  PainSc: 0-No pain         Complications: No notable events documented.

## 2024-04-01 NOTE — Op Note (Signed)
 Pre-Op Dx: Mass of skin of the left shoulder   Post-Op Dx: Same as above   Anesthesia: MAC and local anesthesia   EBL: 0 mL  Complications:  none apparent  Specimen: Left shoulder mass  Procedure: excisional biopsy of mass of skin of the left shoulder   Surgeon: Tye Assistant: Gilmer Luane Hong PA-C  Indications for procedure: Pain and increase size of the mass   Description of Procedure:  Consent obtained, time out performed.  Patient placed in supine position.  Area sterilized and draped in usual position.  Local infused to area previously marked.  6.0 cm incision made through dermis with 15 blade and mass noted in subcutaneous layer.  The  6.9 cm x 6.0 cm x 1.9 cm mass then removed from surrounding tissue completely using electrocautery, passed off field pending pathology. Labeled lateral long, superior short and double deep-medial to the shoulder.  Wound hemostasis noted, then closed in two layer fashion with 3-0 vicryl in interrupted fashion for deep dermal layer, then running 4-0 monocryl in subcuticular fashion for epidermal layer.  Wound then dressed with dermabond.  Pt tolerated procedure well, and transferred to PACU in stable condition. Sponge and instrument count correct at end of procedure.

## 2024-04-01 NOTE — Anesthesia Preprocedure Evaluation (Addendum)
 Anesthesia Evaluation  Patient identified by MRN, date of birth, ID band Patient awake    Reviewed: Allergy & Precautions, NPO status , Patient's Chart, lab work & pertinent test results  History of Anesthesia Complications Negative for: history of anesthetic complications  Airway Mallampati: I   Neck ROM: Full    Dental no notable dental hx.    Pulmonary neg pulmonary ROS   Pulmonary exam normal breath sounds clear to auscultation       Cardiovascular hypertension, Normal cardiovascular exam Rhythm:Regular Rate:Normal     Neuro/Psych  Headaches    GI/Hepatic Neg liver ROS, hiatal hernia,GERD  ,,S/p sleeve gastrectomy   Endo/Other  Obesity   Renal/GU negative Renal ROS     Musculoskeletal   Abdominal   Peds  Hematology negative hematology ROS (+)   Anesthesia Other Findings   Reproductive/Obstetrics                              Anesthesia Physical Anesthesia Plan  ASA: 2  Anesthesia Plan: General   Post-op Pain Management:    Induction: Intravenous  PONV Risk Score and Plan: 3 and Ondansetron , Dexamethasone and Treatment may vary due to age or medical condition  Airway Management Planned: LMA  Additional Equipment:   Intra-op Plan:   Post-operative Plan: Extubation in OR  Informed Consent: I have reviewed the patients History and Physical, chart, labs and discussed the procedure including the risks, benefits and alternatives for the proposed anesthesia with the patient or authorized representative who has indicated his/her understanding and acceptance.     Dental advisory given  Plan Discussed with: CRNA  Anesthesia Plan Comments: (Patient consented for risks of anesthesia including but not limited to:  - adverse reactions to medications - damage to eyes, teeth, lips or other oral mucosa - nerve damage due to positioning  - sore throat or hoarseness - damage to  heart, brain, nerves, lungs, other parts of body or loss of life  Informed patient about role of CRNA in peri- and intra-operative care.  Patient voiced understanding.)         Anesthesia Quick Evaluation

## 2024-04-02 ENCOUNTER — Encounter: Payer: Self-pay | Admitting: Surgery

## 2024-04-05 LAB — SURGICAL PATHOLOGY
